# Patient Record
Sex: Male | Born: 1945
Health system: Southern US, Community
[De-identification: ages and names within clinical notes are randomized; demographics above are authoritative.]

## PROBLEM LIST (undated history)

## (undated) DIAGNOSIS — N529 Male erectile dysfunction, unspecified: Secondary | ICD-10-CM

## (undated) DIAGNOSIS — I509 Heart failure, unspecified: Secondary | ICD-10-CM

## (undated) DIAGNOSIS — Z87442 Personal history of urinary calculi: Secondary | ICD-10-CM

## (undated) DIAGNOSIS — I10 Essential (primary) hypertension: Secondary | ICD-10-CM

## (undated) DIAGNOSIS — E785 Hyperlipidemia, unspecified: Secondary | ICD-10-CM

## (undated) DIAGNOSIS — M1990 Unspecified osteoarthritis, unspecified site: Secondary | ICD-10-CM

## (undated) DIAGNOSIS — M25559 Pain in unspecified hip: Secondary | ICD-10-CM

## (undated) DIAGNOSIS — G8929 Other chronic pain: Secondary | ICD-10-CM

## (undated) DIAGNOSIS — G629 Polyneuropathy, unspecified: Secondary | ICD-10-CM

## (undated) DIAGNOSIS — G4730 Sleep apnea, unspecified: Secondary | ICD-10-CM

## (undated) DIAGNOSIS — H209 Unspecified iridocyclitis: Secondary | ICD-10-CM

## (undated) HISTORY — DX: Essential (primary) hypertension: I10

## (undated) HISTORY — DX: Unspecified iridocyclitis: H20.9

## (undated) HISTORY — PX: TONSILLECTOMY AND ADENOIDECTOMY: SUR1326

## (undated) HISTORY — DX: Other chronic pain: G89.29

## (undated) HISTORY — DX: Hyperlipidemia, unspecified: E78.5

## (undated) HISTORY — DX: Pain in unspecified hip: M25.559

## (undated) HISTORY — PX: HERNIA REPAIR: SHX51

## (undated) HISTORY — DX: Polyneuropathy, unspecified: G62.9

## (undated) HISTORY — DX: Male erectile dysfunction, unspecified: N52.9

---
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
## 1998-01-17 ENCOUNTER — Emergency Department (HOSPITAL_COMMUNITY): Admission: EM | Admit: 1998-01-17 | Discharge: 1998-01-17 | Payer: Self-pay

## 1998-07-22 HISTORY — PX: BACK SURGERY: SHX140

## 1998-10-04 ENCOUNTER — Encounter: Admission: RE | Admit: 1998-10-04 | Discharge: 1998-10-19 | Payer: Self-pay

## 1998-11-16 ENCOUNTER — Ambulatory Visit (HOSPITAL_COMMUNITY): Admission: RE | Admit: 1998-11-16 | Discharge: 1998-11-16 | Payer: Self-pay | Admitting: Neurosurgery

## 1999-02-15 ENCOUNTER — Ambulatory Visit (HOSPITAL_COMMUNITY): Admission: RE | Admit: 1999-02-15 | Discharge: 1999-02-16 | Payer: Self-pay | Admitting: Neurosurgery

## 1999-05-20 ENCOUNTER — Emergency Department (HOSPITAL_COMMUNITY): Admission: EM | Admit: 1999-05-20 | Discharge: 1999-05-20 | Payer: Self-pay | Admitting: Emergency Medicine

## 1999-05-20 ENCOUNTER — Encounter: Payer: Self-pay | Admitting: Urology

## 1999-07-18 ENCOUNTER — Ambulatory Visit (HOSPITAL_COMMUNITY): Admission: RE | Admit: 1999-07-18 | Discharge: 1999-07-18 | Payer: Self-pay | Admitting: Neurosurgery

## 1999-07-25 ENCOUNTER — Ambulatory Visit (HOSPITAL_COMMUNITY): Admission: RE | Admit: 1999-07-25 | Discharge: 1999-07-25 | Payer: Self-pay | Admitting: Neurosurgery

## 1999-10-24 ENCOUNTER — Encounter: Payer: Self-pay | Admitting: Emergency Medicine

## 1999-10-25 ENCOUNTER — Encounter: Payer: Self-pay | Admitting: Emergency Medicine

## 1999-10-25 ENCOUNTER — Inpatient Hospital Stay (HOSPITAL_COMMUNITY): Admission: EM | Admit: 1999-10-25 | Discharge: 1999-10-26 | Payer: Self-pay | Admitting: Emergency Medicine

## 1999-10-26 ENCOUNTER — Encounter: Payer: Self-pay | Admitting: Cardiology

## 2000-01-08 ENCOUNTER — Encounter: Payer: Self-pay | Admitting: Orthopedic Surgery

## 2000-01-08 ENCOUNTER — Ambulatory Visit (HOSPITAL_COMMUNITY): Admission: RE | Admit: 2000-01-08 | Discharge: 2000-01-08 | Payer: Self-pay | Admitting: Orthopedic Surgery

## 2000-12-28 ENCOUNTER — Emergency Department (HOSPITAL_COMMUNITY): Admission: EM | Admit: 2000-12-28 | Discharge: 2000-12-29 | Payer: Self-pay | Admitting: Emergency Medicine

## 2000-12-28 ENCOUNTER — Encounter: Payer: Self-pay | Admitting: Emergency Medicine

## 2002-11-01 ENCOUNTER — Ambulatory Visit (HOSPITAL_COMMUNITY): Admission: RE | Admit: 2002-11-01 | Discharge: 2002-11-01 | Payer: Self-pay | Admitting: *Deleted

## 2003-03-31 ENCOUNTER — Ambulatory Visit (HOSPITAL_COMMUNITY): Admission: RE | Admit: 2003-03-31 | Discharge: 2003-03-31 | Payer: Self-pay | Admitting: Cardiology

## 2003-04-20 ENCOUNTER — Ambulatory Visit (HOSPITAL_BASED_OUTPATIENT_CLINIC_OR_DEPARTMENT_OTHER): Admission: RE | Admit: 2003-04-20 | Discharge: 2003-04-20 | Payer: Self-pay | Admitting: *Deleted

## 2003-06-15 ENCOUNTER — Ambulatory Visit (HOSPITAL_COMMUNITY): Admission: RE | Admit: 2003-06-15 | Discharge: 2003-06-15 | Payer: Self-pay | Admitting: Family Medicine

## 2003-06-24 ENCOUNTER — Ambulatory Visit (HOSPITAL_COMMUNITY): Admission: RE | Admit: 2003-06-24 | Discharge: 2003-06-24 | Payer: Self-pay | Admitting: Family Medicine

## 2003-08-31 ENCOUNTER — Emergency Department (HOSPITAL_COMMUNITY): Admission: EM | Admit: 2003-08-31 | Discharge: 2003-08-31 | Payer: Self-pay | Admitting: Emergency Medicine

## 2005-03-03 ENCOUNTER — Emergency Department (HOSPITAL_COMMUNITY): Admission: EM | Admit: 2005-03-03 | Discharge: 2005-03-03 | Payer: Self-pay | Admitting: Emergency Medicine

## 2005-04-04 ENCOUNTER — Ambulatory Visit (HOSPITAL_COMMUNITY): Admission: RE | Admit: 2005-04-04 | Discharge: 2005-04-04 | Payer: Self-pay | Admitting: Family Medicine

## 2005-05-09 ENCOUNTER — Emergency Department (HOSPITAL_COMMUNITY): Admission: EM | Admit: 2005-05-09 | Discharge: 2005-05-09 | Payer: Self-pay | Admitting: Emergency Medicine

## 2005-05-13 ENCOUNTER — Ambulatory Visit (HOSPITAL_COMMUNITY): Admission: RE | Admit: 2005-05-13 | Discharge: 2005-05-13 | Payer: Self-pay | Admitting: *Deleted

## 2005-05-14 ENCOUNTER — Ambulatory Visit (HOSPITAL_COMMUNITY): Admission: RE | Admit: 2005-05-14 | Discharge: 2005-05-14 | Payer: Self-pay | Admitting: *Deleted

## 2005-08-18 ENCOUNTER — Encounter: Payer: Self-pay | Admitting: Emergency Medicine

## 2005-08-19 ENCOUNTER — Ambulatory Visit: Payer: Self-pay | Admitting: Psychiatry

## 2005-08-19 ENCOUNTER — Inpatient Hospital Stay (HOSPITAL_COMMUNITY): Admission: EM | Admit: 2005-08-19 | Discharge: 2005-08-21 | Payer: Self-pay | Admitting: Psychiatry

## 2005-10-02 ENCOUNTER — Ambulatory Visit (HOSPITAL_COMMUNITY): Admission: RE | Admit: 2005-10-02 | Discharge: 2005-10-02 | Payer: Self-pay | Admitting: General Surgery

## 2006-04-30 ENCOUNTER — Inpatient Hospital Stay (HOSPITAL_COMMUNITY): Admission: EM | Admit: 2006-04-30 | Discharge: 2006-05-02 | Payer: Self-pay | Admitting: Emergency Medicine

## 2006-04-30 ENCOUNTER — Encounter: Payer: Self-pay | Admitting: Cardiology

## 2006-12-09 ENCOUNTER — Emergency Department (HOSPITAL_COMMUNITY): Admission: EM | Admit: 2006-12-09 | Discharge: 2006-12-09 | Payer: Self-pay | Admitting: Emergency Medicine

## 2009-03-28 ENCOUNTER — Emergency Department (HOSPITAL_BASED_OUTPATIENT_CLINIC_OR_DEPARTMENT_OTHER): Admission: EM | Admit: 2009-03-28 | Discharge: 2009-03-28 | Payer: Self-pay | Admitting: Emergency Medicine

## 2009-03-30 ENCOUNTER — Ambulatory Visit: Payer: Self-pay | Admitting: Diagnostic Radiology

## 2009-03-30 ENCOUNTER — Emergency Department (HOSPITAL_BASED_OUTPATIENT_CLINIC_OR_DEPARTMENT_OTHER): Admission: EM | Admit: 2009-03-30 | Discharge: 2009-03-30 | Payer: Self-pay | Admitting: Emergency Medicine

## 2009-04-22 ENCOUNTER — Ambulatory Visit: Payer: Self-pay | Admitting: Diagnostic Radiology

## 2009-04-22 ENCOUNTER — Emergency Department (HOSPITAL_BASED_OUTPATIENT_CLINIC_OR_DEPARTMENT_OTHER): Admission: EM | Admit: 2009-04-22 | Discharge: 2009-04-22 | Payer: Self-pay | Admitting: Emergency Medicine

## 2009-07-20 ENCOUNTER — Emergency Department (HOSPITAL_BASED_OUTPATIENT_CLINIC_OR_DEPARTMENT_OTHER): Admission: EM | Admit: 2009-07-20 | Discharge: 2009-07-20 | Payer: Self-pay | Admitting: Emergency Medicine

## 2009-07-21 ENCOUNTER — Inpatient Hospital Stay (HOSPITAL_COMMUNITY): Admission: EM | Admit: 2009-07-21 | Discharge: 2009-07-23 | Payer: Self-pay

## 2009-07-21 ENCOUNTER — Encounter: Payer: Self-pay | Admitting: Emergency Medicine

## 2009-07-21 ENCOUNTER — Ambulatory Visit: Payer: Self-pay | Admitting: Diagnostic Radiology

## 2009-07-22 ENCOUNTER — Encounter (INDEPENDENT_AMBULATORY_CARE_PROVIDER_SITE_OTHER): Payer: Self-pay | Admitting: Surgery

## 2010-10-07 LAB — COMPREHENSIVE METABOLIC PANEL
ALT: 73 U/L — ABNORMAL HIGH (ref 0–53)
AST: 75 U/L — ABNORMAL HIGH (ref 0–37)
Albumin: 2.8 g/dL — ABNORMAL LOW (ref 3.5–5.2)
Alkaline Phosphatase: 58 U/L (ref 39–117)
BUN: 6 mg/dL (ref 6–23)
CO2: 28 mEq/L (ref 19–32)
Calcium: 8.2 mg/dL — ABNORMAL LOW (ref 8.4–10.5)
Chloride: 105 mEq/L (ref 96–112)
Creatinine, Ser: 1.1 mg/dL (ref 0.4–1.5)
GFR calc Af Amer: >60 mL/min (ref >60)
GFR calc non Af Amer: >60 mL/min (ref >60)
Glucose, Bld: 112 mg/dL — ABNORMAL HIGH (ref 70–99)
Potassium: 4 mEq/L (ref 3.5–5.1)
Sodium: 139 mEq/L (ref 135–145)
Total Bilirubin: 1.3 mg/dL — ABNORMAL HIGH (ref 0.3–1.2)
Total Protein: 5.7 g/dL — ABNORMAL LOW (ref 6.0–8.3)

## 2010-10-07 LAB — CBC
HCT: 34.8 % — ABNORMAL LOW (ref 39.0–52.0)
HCT: 36.3 % — ABNORMAL LOW (ref 39.0–52.0)
Hemoglobin: 12.3 g/dL — ABNORMAL LOW (ref 13.0–17.0)
Hemoglobin: 12.7 g/dL — ABNORMAL LOW (ref 13.0–17.0)
MCHC: 35.1 g/dL (ref 30.0–36.0)
MCHC: 35.2 g/dL (ref 30.0–36.0)
MCV: 91.8 fL (ref 78.0–100.0)
MCV: 92.1 fL (ref 78.0–100.0)
Platelets: 166 10*3/uL (ref 150–400)
Platelets: 186 10*3/uL (ref 150–400)
RBC: 3.79 MIL/uL — ABNORMAL LOW (ref 4.22–5.81)
RBC: 3.94 MIL/uL — ABNORMAL LOW (ref 4.22–5.81)
RDW: 14 % (ref 11.5–15.5)
RDW: 14.2 % (ref 11.5–15.5)
WBC: 12.4 10*3/uL — ABNORMAL HIGH (ref 4.0–10.5)
WBC: 13.7 10*3/uL — ABNORMAL HIGH (ref 4.0–10.5)

## 2010-10-07 LAB — BASIC METABOLIC PANEL
BUN: 7 mg/dL (ref 6–23)
CO2: 29 mEq/L (ref 19–32)
Calcium: 8.4 mg/dL (ref 8.4–10.5)
Chloride: 101 mEq/L (ref 96–112)
Creatinine, Ser: 1.26 mg/dL (ref 0.4–1.5)
GFR calc Af Amer: >60 mL/min (ref >60)
GFR calc non Af Amer: 58 mL/min — ABNORMAL LOW (ref >60)
Glucose, Bld: 101 mg/dL — ABNORMAL HIGH (ref 70–99)
Potassium: 3.8 mEq/L (ref 3.5–5.1)
Sodium: 137 mEq/L (ref 135–145)

## 2010-10-22 LAB — COMPREHENSIVE METABOLIC PANEL
ALT: 23 U/L (ref 0–53)
ALT: 25 U/L (ref 0–53)
AST: 24 U/L (ref 0–37)
AST: 26 U/L (ref 0–37)
Albumin: 4.1 g/dL (ref 3.5–5.2)
Alkaline Phosphatase: 91 U/L (ref 39–117)
BUN: 15 mg/dL (ref 6–23)
CO2: 30 mEq/L (ref 19–32)
CO2: 30 mEq/L (ref 19–32)
Calcium: 9.4 mg/dL (ref 8.4–10.5)
Calcium: 9.8 mg/dL (ref 8.4–10.5)
Chloride: 103 mEq/L (ref 96–112)
Creatinine, Ser: 1 mg/dL (ref 0.4–1.5)
GFR calc Af Amer: >60 mL/min (ref >60)
GFR calc Af Amer: >60 mL/min (ref >60)
GFR calc non Af Amer: >60 mL/min (ref >60)
Glucose, Bld: 101 mg/dL — ABNORMAL HIGH (ref 70–99)
Potassium: 4.2 mEq/L (ref 3.5–5.1)
Sodium: 143 mEq/L (ref 135–145)
Sodium: 143 mEq/L (ref 135–145)
Total Bilirubin: 0.5 mg/dL (ref 0.3–1.2)
Total Protein: 7.3 g/dL (ref 6.0–8.3)
Total Protein: 7.3 g/dL (ref 6.0–8.3)

## 2010-10-22 LAB — CBC
HCT: 40 % (ref 39.0–52.0)
Hemoglobin: 14 g/dL (ref 13.0–17.0)
MCHC: 35.1 g/dL (ref 30.0–36.0)
MCHC: 35.1 g/dL (ref 30.0–36.0)
MCV: 91.2 fL (ref 78.0–100.0)
Platelets: 245 10*3/uL (ref 150–400)
RBC: 4.38 MIL/uL (ref 4.22–5.81)
RBC: 4.48 MIL/uL (ref 4.22–5.81)
RDW: 13.1 % (ref 11.5–15.5)
RDW: 13.3 % (ref 11.5–15.5)
WBC: 11.9 10*3/uL — ABNORMAL HIGH (ref 4.0–10.5)

## 2010-10-22 LAB — DIFFERENTIAL
Basophils Absolute: 0.2 10*3/uL — ABNORMAL HIGH (ref 0.0–0.1)
Basophils Relative: 2 % — ABNORMAL HIGH (ref 0–1)
Eosinophils Absolute: 0.1 10*3/uL (ref 0.0–0.7)
Eosinophils Absolute: 0.2 10*3/uL (ref 0.0–0.7)
Eosinophils Relative: 1 % (ref 0–5)
Eosinophils Relative: 2 % (ref 0–5)
Lymphocytes Relative: 18 % (ref 12–46)
Lymphs Abs: 1.3 10*3/uL (ref 0.7–4.0)
Lymphs Abs: 2.2 10*3/uL (ref 0.7–4.0)
Monocytes Absolute: 0.8 10*3/uL (ref 0.1–1.0)
Monocytes Relative: 4 % (ref 3–12)
Monocytes Relative: 6 % (ref 3–12)
Neutro Abs: 8.5 10*3/uL — ABNORMAL HIGH (ref 1.7–7.7)
Neutrophils Relative %: 72 % (ref 43–77)

## 2010-10-22 LAB — LIPASE, BLOOD: Lipase: 51 U/L (ref 23–300)

## 2010-10-22 LAB — POCT CARDIAC MARKERS
CKMB, poc: 4 ng/mL (ref 1.0–8.0)
Myoglobin, poc: 132 ng/mL (ref 12–200)
Troponin i, poc: <0.05 ng/mL (ref 0.00–0.09)

## 2010-10-25 LAB — CBC
HCT: 39.6 % (ref 39.0–52.0)
Hemoglobin: 14.1 g/dL (ref 13.0–17.0)
RDW: 12.8 % (ref 11.5–15.5)

## 2010-10-25 LAB — D-DIMER, QUANTITATIVE: D-Dimer, Quant: <0.22 ug/mL-FEU (ref 0.00–0.48)

## 2010-10-25 LAB — BASIC METABOLIC PANEL
BUN: 20 mg/dL (ref 6–23)
CO2: 27 mEq/L (ref 19–32)
Chloride: 105 mEq/L (ref 96–112)
Creatinine, Ser: 1 mg/dL (ref 0.4–1.5)

## 2010-10-25 LAB — DIFFERENTIAL
Basophils Absolute: 0.3 10*3/uL — ABNORMAL HIGH (ref 0.0–0.1)
Eosinophils Relative: 3 % (ref 0–5)
Lymphocytes Relative: 20 % (ref 12–46)
Lymphs Abs: 2 10*3/uL (ref 0.7–4.0)
Monocytes Absolute: 0.6 10*3/uL (ref 0.1–1.0)
Neutro Abs: 6.9 10*3/uL (ref 1.7–7.7)
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
## 2010-12-07 NOTE — Op Note (Signed)
NAMEGAYLON, BENTZ              ACCOUNT NO.:  [id number]   MEDICAL RECORD NO.:  [id number]          PATIENT TYPE:  INP   LOCATION:  3710                         FACILITY:  [facility]   PHYSICIAN:  John C. Madilyn Fireman, M.D.    DATE OF BIRTH:  1946/04/20   DATE OF PROCEDURE:  05/02/2006  DATE OF DISCHARGE:                                 OPERATIVE REPORT   PROCEDURE:  Esophagogastroduodenoscopy with esophageal dilatation.   ENDOSCOPIST:  Everardo All. Madilyn Fireman, M.D.   INDICATIONS FOR PROCEDURE:  Chest pain, dysphagia and typical reflux  symptoms with cardiac etiology of current chest pain ruled out this  admission.   PROCEDURE:  The patient was placed in the left lateral decubitus position  and placed on the pulse monitor with continuous low-flow oxygen delivered by  nasal cannula.  He was sedated with 50 mcg of IV fentanyl and 6 mg of IV  Versed.  The Olympus video endoscope was advanced under direct vision into  the oropharynx and esophagus.  The esophagus was straight and of normal  caliber.  The  squamocolumnar line at 37 cm.  There was a circumferential  distinct but widely patent ring at the GE junction.  There was no visible  esophagitis or significant length of stricture.  There was a 2.5 cm hiatal  hernia distal to the ring.  The stomach was entered.  A small amount of what  liquid secretions were suctioned from the fundus.  A retroflexed view of the  cardia confirmed a hiatal hernia, but was otherwise unremarkable.  The  fundus, body, antrum and pylorus all appeared normal.  The duodenum was  entered, and both bulb and second portion were well inspected and appeared  be within normal limits.  A Savary guidewire was placed through the  endoscope channel and entered into the second portion of the duodenum, and  the scope withdrawn.  A single 18 mm Savary dilator was passed over the  guidewire with mild resistance, and no blood seen on withdrawal.  The  dilator was removed together with the  wire.   The patient returned to the recovery room in stable condition.  He tolerated  the procedure well.  There were no immediate complications.   IMPRESSION:  1. Lower esophageal ring.  2. Hiatal hernia.   PLAN:  1. Advance diet and observe response to dilatation.  2. Would keep the patient on a proton pump inhibitor for at least two      months and assess the response in terms of chest pain and reflux      symptoms.          ______________________________  Everardo All Madilyn Fireman, M.D.    JCH/MEDQ  D:  05/02/2006  T:  05/04/2006  Job:  [id number]   cc:   Peter M. Sw[place]iland, M.D.
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
## 2010-12-07 NOTE — Op Note (Signed)
NAMEESSEX, Timothy              ACCOUNT NO.:  [id number]   MEDICAL RECORD NO.:  [id number]          PATIENT TYPE:  AMB   LOCATION:  SDS                          FACILITY:  [facility]   PHYSICIAN:  Sharlet Salina T. Hoxworth, M.D.DATE OF BIRTH:  1945-09-24   DATE OF PROCEDURE:  10/02/2005  DATE OF DISCHARGE:  10/02/2005                                 OPERATIVE REPORT   PRE AND POSTOPERATIVE DIAGNOSIS:  Right inguinal hernia.   SURGICAL PROCEDURES:  Repair of right inguinal hernia.   SURGEON:  Lorne Skeens. Hoxworth, M.D.   ANESTHESIA:  Local with IV sedation.   BRIEF HISTORY:  Timothy Sheppard is a 65 year old male who presents with  increasing symptomatic right inguinal hernia confirmed on exam. Options for  repair were discussed and we have to elected proceed with open repair using  mesh under local anesthesia with sedation as an outpatient. The nature of  the procedure, indications, risks of bleeding, infection, recurrence of  chronic pain were discussed and understood. He is now brought to operating  room for this procedure.   DESCRIPTION OF OPERATION:  The patient was brought to the operating room and  placed supine position on operating table and IV sedation was administered.  The right groin was sterilely prepped and draped. He received preoperative  IV antibiotics. Correct patient and procedure were verified. Local  anesthesia was used to block the ilioinguinal nerve and infiltrate the area  of the incision. Oblique incision was made in right groin and dissection  carried down to subcutaneous tissue sharply to the external oblique.  Crossing veins were doubly clamped, divided, ligated with 3-0 Vicryl. The  external oblique was anesthetized and divided along the lines of its fibers  through the external ring. The inguinal ligament, rectus sheath and cord  were anesthetized. The spermatic cord was dissected up off of the floor at  the pubic tubercle. Cremasteric fibers were divided  bilaterally up to the  internal ring completely freeing the cord. There was a good sized direct  hernia present. The attenuated transversalis fascia was dissected away from  the cord structures and completely freed from surrounding tissue. It was  then reduced and imbricated with a running 2-0 Prolene suture to hold it  reduced. Dissection within the cord revealed no evidence of an indirect  hernia. The ilioinguinal nerve was identified and protected. A piece of  Parietex mesh was trimmed to size to fit the floor of the inguinal canal  with tails around the cord at the internal ring. The mesh was sutured  initially to the pubic tubercle and to the inguinal ligament, iliopubic  tract with running 2-0 Prolene working medial to lateral.  Medially, the  mesh was sutured to the edge of the rectus sheath with interrupted 2-0  Prolene. Tails were then tacked together lateral to cord with interrupted 2-  0 Prolene creating a new internal ring snug to a fingertip. This appeared to  provide nice broad coverage to the direct and indirect spaces. Following  this, the cord and ilioinguinal nerves returned to their anatomic position.  The external oblique was closed over  this with running 3-0 Vicryl. Scarpa  fascia was closed  with running 3-0 Vicryl. The skin was closed with running subcuticular 4-0  Monocryl and Steri-Strips.  Sponge, needle and instrument counts were  correct..  Dry sterile dressing was applied. The patient taken recovery in  good condition.      Lorne Skeens. Hoxworth, M.D.  Electronically Signed     BTH/MEDQ  D:  10/02/2005  T:  10/03/2005  Job:  [id number]

## 2010-12-07 NOTE — Procedures (Signed)
EEG NUMBER:  __________   HISTORY:  This is a 65 year old with staring spells, who is having an EEG  done to evaluate for seizures.   EEG PROTOCOL:  This is a routine EEG.   TECHNICAL DESCRIPTION:  Throughout this routine EEG, there is a posterior-  dominant rhythm of 9.5-Hz activity at 20-30 mV.  The background activity is  symmetric and mostly comprised of alpha-range activity at 10-25 mV.  Superimposed throughout the background is faster beta activity noted.  With  photic stimulation, there is a mild symmetric photic driving response seen.  Hyperventilation does not produce any significant abnormalities.  The  patient does not go to sleep during this recording.  Throughout this record,  there is no evidence of electrographic seizures or interictal discharge  activity.  Of note on the EKG, the patient seems to have a baseline heart  rate of around 50 beats per minute.   IMPRESSION:  This routine EEG is within normal limits in the awake state.  There is an incidental finding of EKG with bradycardia of around 50 beats  per minute.           ______________________________  Bevelyn Buckles. Nash Shearer, M.D.     YNW:GNFA  D:  05/13/2005 12:48:42  T:  05/14/2005 04:24:38  Job #:  [id number]

## 2010-12-07 NOTE — Discharge Summary (Signed)
[place]  Patient:    Timothy, Sheppard                     MRN: [id number] Adm. Date:  [id number] Disc. Date: [id number] Attending:  Marily Memos CC:         Peter M. Swaziland, M.D.                           Discharge Summary  ADMITTING DIAGNOSES: 1. Chest pain, rule out angina. 2. Dyspnea. 3. Erectile dysfunction. 4. Degenerative disc disease with chronic low back pain.  HISTORY OF PRESENT ILLNESS:  The patient is a 65 year old white male with gradual onset of increasing shortness of breath since mid morning on the day of admission associated with left upper arm aching and some chest tightness that worsened about 4 p.m.  Finally he told his wife this evening and his wife called 911.  He also felt there was some left jaw pain associated with these other symptoms.  He did receive some relief with sublingual nitroglycerin. There is no prior history of chest pain or shortness of breath.  No cardiac history.  He denied any cough, fever, worsening dyspnea with exertion or diaphoresis.  He did have some nausea earlier in the day.  No vomiting.  Does have chronic bilateral lower extremity pain secondary to multiple lumbar disks.  Left leg pain greater than right recently.  Denies any significant edema, swelling or redness of the lower extremities.  The rest of the history and physical can be obtained from the admit note.  MEDICATIONS PRIOR TO ADMISSION: 1. Elavil 10 mg p.o. q.h.s. 2. Valium 2.5 mg p.r.n. 3. Flexeril 10 mg t.i.d. 4. Hydrocodone p.r.n.  HOSPITAL COURSE:  The patient was admitted and routine laboratory was obtained, including EKG.  He was started on aspirin.  Cardiology was consulted who felt a two day adenosine Cardiolite study was in order.  This was performed on April 4 and 5.  This showed no evidence of ischemia or infarct, borderline global hypokinesia with an ejection fraction of about 43%.  He was felt ready for discharge  on October 26, 1999, with follow-up 2D echocardiogram as an outpatient to follow his LV function.  He was also begun on Pepcid, feeling that his chest discomfort was probably cardiac in etiology.  LABORATORY DATA:  The pH was 7.377, pCO2 47.6, bicarb 28.  White count on October 24, 1999, was 10,600; repeat on October 26, 1999, was 8900.  He had an essentially normal differential.  His hemoglobin was 14.3 and on October 26, 1999 it was 40 (this was felt to be dilutional.  MCV was 85.4.  His initial PT was 14.1 with an INR of 1.2 and he was begun on heparin.  His initial PTT was greater than 200.  Repeat was 85 at 1 p.m. on October 25, 1999, and on October 26, 1999 at 0330 was 58.  Sodium 141, potassium 4, chloride 103, glucose 101, BUN 12 and creatinine 1.0.  Initial CK was 131 with MB of 2.3 and a relative index of 1.7.  Troponin  was less than 0.03 on all three of these measurements (including the following two). His second CK was 112 with MB 1.2 and a relative index of 1.1 and his final was 90 with MB 1.1 and relative index of 1.2.  Lipid panel showed a cholesterol of 129, triglycerides 73, HDL 30, LDL 84  with a total cholesterol/HDL ratio of 4.3.  Urinalysis revealed a specific gravity of greater than 1.040, pH 5, all else was essentially negative.  His urobilinogen was 0.2.  An EKG at 9:40 on October 24, 1999, revealed a sinus tachycardia but no acute abnormalities.  He did have a CT of his chest and bilateral limited lower extremities which revealed no evidence of DVT, no evidence of pulmonary emboli, minimal bilateral patchy atelectasis versus scar formation (essentially normal).  His chest x-ray showed stable minimal chronic bronchitic changes, no acute abnormality.  DISPOSITION:  On his discharge to home he was instructed to follow up with Dr. Andrey Campanile in one week and Dr. Swaziland will arrange for a 2D echo.  He is to continue his medications as listed above plus Pepcid 20 mg twice a day.   His activities are as tolerated.  He is recommended to lose weight and follow a low fat, low salt diet. DD:  11/25/99 TD:  11/26/99 Job: [id number]
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
## 2010-12-07 NOTE — Discharge Summary (Signed)
Timothy Sheppard, PURYEAR              ACCOUNT NO.:  [id number]   MEDICAL RECORD NO.:  [id number]          PATIENT TYPE:  INP   LOCATION:  3710                         FACILITY:  [facility]   PHYSICIAN:  [name], MD  DATE OF BIRTH:  25-May-1946   DATE OF ADMISSION:  04/29/2006  DATE OF DISCHARGE:  05/02/2006                                 DISCHARGE SUMMARY   CHIEF COMPLAINT:  At the time of admission was syncope with collapse and  chest pain.   DISCHARGE DIAGNOSES:  1. Syncope with collapse, not correlated with his underlying cardiac      condition.  The patient was seen and evaluated by cardiology.  He is      noted to have sinus bradycardia but is felt this is not contributing to      his event.  Evidently the patient had a stressful argument just prior      to this and this may have precipitated some of the event, however,      during hospital stay he had a similar event that was associated with      some bradycardia.  The cardiology team still feels that this is not      related as he has generally been asymptomatic with his bradycardia.  He      recently had a catheterization which really showed no significant      stenosis and his cardiac enzymes and echocardiogram during the course      of this hospital stay have been within normal limits.  The patient did,      however, continued to have chest pain and therefore further workup of      noncardiac chest pain was recommended.  2. Noncardiac chest pain.  The patient did relate to [place] additional      symptoms of dysphagia for solids.  He, therefore, underwent a      gastroenterology evaluation by [facility].  On May 02, 2006, he underwent endoscopic evaluation, was noted to have a Schatzki      ring which was dilated without complication.  At this time the patient      relates no further chest discomfort and is requesting discharge to      home.  3. History of seizure disorder.  The patient has been  diagnosed in the      past with potential seizure disorder, however, on admission did not      appear to be on any medications for this condition.  He has been      evaluated and treated for this in the past.  No further evaluation is      deemed necessary during this hospital stay as this does not appear to      be seizure related.  He did, however, undergo a MRI study which showed      no obvious acute disease.  4. The patient does have a history of adjustment disorder requiring      inpatient psychiatric stay.  At this time the patient appears to be at  baseline with his anxiety disorder and adjustment disorder with no      intervention necessary.  Please note, during the hospital stay he did      require occasional Ativan.   DISCHARGE MEDICATIONS:  1. Aspirin 325 mg once daily.  2. Prilosec over-the-counter once daily.   At this time he is on no medications for seizures and can followup in the  outpatient setting with Dr. Tiburcio Pea with regard to this status.   STUDIES:  During the course of this hospital stay, as stated, MRI showed no  acute intracranial abnormality.  No significant narrowing in the proximal  area or branch vessels.  A portable chest x-ray was completed and showed no  acute abnormality.  A 2D echo was performed which showed normal LV function  with mild left atrial enlargement and mild MR. The official report is not in  the computer at this time.   PHYSICAL EXAMINATION:  GENERAL:  On the day of discharge, the patient is  clinically stable.  Heart rate ranges as low as 39, up to 74, with no  symptoms of bradycardia.  He is a well-developed, well-nourished white male  in no acute distress.  HEENT:  Normocephalic atraumatic.  Pupils are equal, round, and reactive to  light.  Extraocular muscles are intact.  Mucous membranes are moist.  NECK:  Supple.  There is no JVD.  No lymph nodes.  No carotid bruit.  CHEST:  Clear to auscultation.  There are no rhonchi,  rales, or wheezes.  CARDIOVASCULAR:  Regular rate and rhythm.  Positive S1 S2.  No S3, S4.  No  murmurs, rubs, or gallops.  ABDOMEN:  Soft, nontender, nondistended with positive bowel sounds.  NEUROLOGIC:  The patient is awake, alert, oriented.  Cranial nerves II-XII  are intact.  Power is 5/5.  DTRs are 2+.  Plantars are downgoing.   PERTINENT LABORATORY VALUES:  TSH is 0.903.  His urine drug screen was  positive for opiates but no illicit drugs.  His cardiac panels have remained  negative.  His PTT was 36.  His PT is 15.7 with an INR of 1.2.  His BNP is  49.  His most recent BUN was 11 with a creatinine of 1.0, and his potassium  was 3.5 .   At this time, the patient is deemed stable for discharge to follow up as an  outpatient with Dr. Swaziland and can follow up with Dr. Madilyn Fireman as necessary.  He can see Dr. Holley Bouche in the next 1 week to coordinate care.      [name], MD  Electronically Signed     MLT/MEDQ  D:  05/02/2006  T:  05/02/2006  Job:  [id number]   cc:   Holley Bouche, M.D.  John C. Madilyn Fireman, M.D.  Peter M. Swaziland, M.D.
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
## 2013-04-20 ENCOUNTER — Emergency Department (HOSPITAL_COMMUNITY)
Admission: EM | Admit: 2013-04-20 | Discharge: 2013-04-20 | Disposition: A | Discharge status: Home / Self Care | Payer: Medicare Other | Source: Home / Self Care

## 2013-04-20 ENCOUNTER — Encounter (HOSPITAL_COMMUNITY): Payer: Self-pay | Admitting: *Deleted

## 2013-04-20 DIAGNOSIS — R39198 Other difficulties with micturition: Secondary | ICD-10-CM

## 2013-04-20 DIAGNOSIS — M25512 Pain in left shoulder: Secondary | ICD-10-CM

## 2013-04-20 DIAGNOSIS — M25519 Pain in unspecified shoulder: Secondary | ICD-10-CM

## 2013-04-20 DIAGNOSIS — R3912 Poor urinary stream: Secondary | ICD-10-CM

## 2013-04-20 DIAGNOSIS — R509 Fever, unspecified: Secondary | ICD-10-CM

## 2013-04-20 DIAGNOSIS — I10 Essential (primary) hypertension: Secondary | ICD-10-CM

## 2013-04-20 HISTORY — DX: Essential (primary) hypertension: I10

## 2013-04-20 HISTORY — DX: Heart failure, unspecified: I50.9

## 2013-04-20 MED ORDER — AMOXICILLIN-POT CLAVULANATE 875-125 MG PO TABS: 1.0000 | ORAL_TABLET | Freq: Two times a day (BID) | ORAL | Status: DC

## 2013-04-20 MED ORDER — CYCLOBENZAPRINE HCL 5 MG PO TABS: 5.0000 mg | ORAL_TABLET | Freq: Three times a day (TID) | ORAL | Status: DC | PRN

## 2013-04-20 MED ORDER — CLONIDINE HCL 0.1 MG PO TABS
ORAL_TABLET | ORAL | Status: AC
  Filled 2013-04-20: qty 1

## 2013-04-20 MED ORDER — TAMSULOSIN HCL 0.4 MG PO CAPS: 0.4000 mg | ORAL_CAPSULE | Freq: Every day | ORAL | Status: DC

## 2013-04-20 MED ORDER — CLONIDINE HCL 0.1 MG PO TABS
0.1000 mg | ORAL_TABLET | Freq: Once | ORAL | Status: AC
  Administered 2013-04-20: 0.1 mg via ORAL

## 2013-04-20 MED ORDER — LISINOPRIL 10 MG PO TABS: 10.0000 mg | ORAL_TABLET | Freq: Every day | ORAL | Status: DC

## 2013-04-20 MED ORDER — KETOROLAC TROMETHAMINE 60 MG/2ML IM SOLN
60.0000 mg | Freq: Once | INTRAMUSCULAR | Status: AC
  Administered 2013-04-20: 60 mg via INTRAMUSCULAR

## 2013-04-20 MED ORDER — MELOXICAM 7.5 MG PO TABS: 7.5000 mg | ORAL_TABLET | Freq: Every day | ORAL | Status: DC

## 2013-04-20 MED ORDER — KETOROLAC TROMETHAMINE 60 MG/2ML IM SOLN
INTRAMUSCULAR | Status: AC
  Filled 2013-04-20: qty 2

## 2013-04-20 NOTE — ED Provider Notes (Signed)
Medical screening examination/treatment/procedure(s) were performed by non-physician practitioner and as supervising physician I was immediately available for consultation/collaboration.  Leslee Home, M.D.  Reuben Likes, MD 04/20/13 [phone]

## 2013-04-20 NOTE — ED Provider Notes (Signed)
CSN: [id number]     Arrival date & time 04/20/13  1902 History   None    Chief Complaint  Patient presents with  . Fever   (Consider location/radiation/quality/duration/timing/severity/associated sxs/prior Treatment) HPI  Fever and general fatigue: Started SUnday. Chills. Tolerating PO. Wife w/ PNA last week. Diarrhea and nausea. Aleve and ASA w/ mild benefit. Comes and goes. Cough but no rhinorrhea.   Aches in shoulder. After playing w/ grandson.  Not taking lisinopril on a regular basis. Denies CP, HA, SOB  Low stream on urination. Denies dysuria. Slowly getting worse.    Past Medical History  Diagnosis Date  . Hypertension   . CHF (congestive heart failure)    Past Surgical History  Procedure Laterality Date  . Orif femur fracture  1967    rod insertion   Family History  Problem Relation Age of Onset  . Heart failure Mother   . Lupus Mother   . Heart failure Father    History  Substance Use Topics  . Smoking status: Current Some Day Smoker    Types: Pipe  . Smokeless tobacco: Not on file  . Alcohol Use: No    Review of Systems  All other systems reviewed and are negative.    Allergies  Review of patient's allergies indicates no known allergies.  Home Medications   Current Outpatient Rx  Name  Route  Sig  Dispense  Refill  . aspirin 81 MG tablet   Oral   Take 81 mg by mouth daily.         . naproxen sodium (ANAPROX) 220 MG tablet   Oral   Take 220 mg by mouth as needed.         Marland Kitchen amoxicillin-clavulanate (AUGMENTIN) 875-125 MG per tablet   Oral   Take 1 tablet by mouth 2 (two) times daily.   20 tablet   0   . cyclobenzaprine (FLEXERIL) 5 MG tablet   Oral   Take 1-2 tablets (5-10 mg total) by mouth 3 (three) times daily as needed for muscle spasms.   30 tablet   0   . lisinopril (PRINIVIL,ZESTRIL) 10 MG tablet   Oral   Take 1 tablet (10 mg total) by mouth daily.   30 tablet   11   . meloxicam (MOBIC) 7.5 MG tablet   Oral   Take  1-2 tablets (7.5-15 mg total) by mouth daily.   30 tablet   0   . tamsulosin (FLOMAX) 0.4 MG CAPS capsule   Oral   Take 1 capsule (0.4 mg total) by mouth daily after breakfast.   30 capsule   1    BP 141/100  Pulse 109  Temp(Src) 100.1 F (37.8 C)  Resp 16  SpO2 98% Physical Exam  Constitutional: He is oriented to person, place, and time. He appears well-developed and well-nourished. No distress.  HENT:  Head: Normocephalic and atraumatic.  Eyes: EOM are normal. Pupils are equal, round, and reactive to light.  Neck: Normal range of motion. Neck supple.  Cardiovascular: Normal rate and intact distal pulses.   Murmur heard. Pulmonary/Chest: Effort normal and breath sounds normal. No respiratory distress.  Abdominal: Soft. Bowel sounds are normal.  Musculoskeletal: Normal range of motion. He exhibits no edema and no tenderness.  FROm of L shoulder. Muscle tightness on exam and generally stiff. No crepitus  Neurological: He is alert and oriented to person, place, and time.  Skin: Skin is warm and dry. He is not diaphoretic.  Psychiatric: He  has a normal mood and affect. His behavior is normal. Judgment and thought content normal.  1+ LE pitting edema   ED Course  Procedures (including critical care time) Labs Review Labs Reviewed - No data to display Imaging Review No results found.  MDM   1. Febrile illness   2. Shoulder pain, left   3. Poor urinary stream   4. HTN (hypertension)    67yo M w/ h/o CHF and uncontrolled HTN w/ likely viral illness but concern for bacterial superinfection (wife w/ recnet PNA) and concern for CHF exacerbation given mitral valve insufficiency and extreme HTN. No current evidence for decompensation but will provide ABX coverage given risk factors. Muscle spasm from recent shoulder injury. Likely BPH given prolonged h/o poor urinary stream.  - Clonidine in clinic for HTN, refilled lisinopril as pt w/o PCP (PCP died) - flomax (risks benefit  explained) - flexeril and meloxicam for shoulder - Augmentin - precautions given and all questions answered  Shelly Flatten, MD Family Medicine PGY-3 04/20/2013, 10:24 PM      Ozella Rocks, MD 04/20/13 2224

## 2013-04-20 NOTE — ED Notes (Signed)
C/o feeling hot and cold since Sunday with aching all over, sorethroat and tongue is sore.  Sore in his shoulder and down his arm x 10 days and his hands got to sleep a lot.  Was laying down on the floor with his grandson and was propped up on his arm.  He thinks he strained it. Sore in his tricep and shoulder with movement.  Hx. CHF.

## 2013-04-25 ENCOUNTER — Encounter (HOSPITAL_COMMUNITY): Payer: Self-pay | Admitting: *Deleted

## 2013-04-25 ENCOUNTER — Emergency Department (HOSPITAL_COMMUNITY): Payer: Medicare Other

## 2013-04-25 ENCOUNTER — Observation Stay (HOSPITAL_COMMUNITY)
Admission: EM | Admit: 2013-04-25 | Discharge: 2013-04-26 | Disposition: A | Discharge status: Home / Self Care | Payer: Medicare Other | Attending: Internal Medicine | Admitting: Internal Medicine

## 2013-04-25 DIAGNOSIS — R0789 Other chest pain: Principal | ICD-10-CM | POA: Insufficient documentation

## 2013-04-25 DIAGNOSIS — I10 Essential (primary) hypertension: Secondary | ICD-10-CM | POA: Insufficient documentation

## 2013-04-25 DIAGNOSIS — R0602 Shortness of breath: Secondary | ICD-10-CM | POA: Insufficient documentation

## 2013-04-25 DIAGNOSIS — K1370 Unspecified lesions of oral mucosa: Secondary | ICD-10-CM | POA: Insufficient documentation

## 2013-04-25 DIAGNOSIS — I080 Rheumatic disorders of both mitral and aortic valves: Secondary | ICD-10-CM | POA: Insufficient documentation

## 2013-04-25 DIAGNOSIS — R079 Chest pain, unspecified: Secondary | ICD-10-CM | POA: Diagnosis present

## 2013-04-25 DIAGNOSIS — Z8249 Family history of ischemic heart disease and other diseases of the circulatory system: Secondary | ICD-10-CM | POA: Insufficient documentation

## 2013-04-25 DIAGNOSIS — K219 Gastro-esophageal reflux disease without esophagitis: Secondary | ICD-10-CM | POA: Insufficient documentation

## 2013-04-25 DIAGNOSIS — Z7982 Long term (current) use of aspirin: Secondary | ICD-10-CM | POA: Insufficient documentation

## 2013-04-25 DIAGNOSIS — M79609 Pain in unspecified limb: Secondary | ICD-10-CM | POA: Insufficient documentation

## 2013-04-25 DIAGNOSIS — J209 Acute bronchitis, unspecified: Secondary | ICD-10-CM | POA: Insufficient documentation

## 2013-04-25 DIAGNOSIS — R209 Unspecified disturbances of skin sensation: Secondary | ICD-10-CM | POA: Insufficient documentation

## 2013-04-25 HISTORY — DX: Chest pain, unspecified: R07.9

## 2013-04-25 LAB — CBC WITH DIFFERENTIAL/PLATELET
HCT: 41.4 % (ref 39.0–52.0)
Hemoglobin: 14.2 g/dL (ref 13.0–17.0)
Lymphs Abs: 1 10*3/uL (ref 0.7–4.0)
MCH: 28.9 pg (ref 26.0–34.0)
MCHC: 34.3 g/dL (ref 30.0–36.0)
Monocytes Absolute: 0.4 10*3/uL (ref 0.1–1.0)
Monocytes Relative: 6 % (ref 3–12)
Neutro Abs: 5.2 10*3/uL (ref 1.7–7.7)
Neutrophils Relative %: 78 % — ABNORMAL HIGH (ref 43–77)
RBC: 4.92 MIL/uL (ref 4.22–5.81)

## 2013-04-25 LAB — BASIC METABOLIC PANEL
BUN: 13 mg/dL (ref 6–23)
Chloride: 101 mEq/L (ref 96–112)
Creatinine, Ser: 0.78 mg/dL (ref 0.50–1.35)
Glucose, Bld: 109 mg/dL — ABNORMAL HIGH (ref 70–99)
Potassium: 3.6 mEq/L (ref 3.5–5.1)

## 2013-04-25 LAB — TROPONIN I: Troponin I: <0.3 ng/mL (ref <0.30)

## 2013-04-25 MED ORDER — ONDANSETRON HCL 4 MG PO TABS: 4.0000 mg | ORAL_TABLET | Freq: Four times a day (QID) | ORAL | Status: DC | PRN

## 2013-04-25 MED ORDER — ONDANSETRON HCL 4 MG/2ML IJ SOLN
4.0000 mg | Freq: Once | INTRAMUSCULAR | Status: AC
  Administered 2013-04-25: 4 mg via INTRAVENOUS
  Filled 2013-04-25: qty 2

## 2013-04-25 MED ORDER — LORAZEPAM 0.5 MG PO TABS
0.5000 mg | ORAL_TABLET | Freq: Once | ORAL | Status: AC
  Administered 2013-04-25: 0.5 mg via ORAL
  Filled 2013-04-25: qty 1

## 2013-04-25 MED ORDER — SODIUM CHLORIDE 0.9 % IJ SOLN: 3.0000 mL | INTRAMUSCULAR | Status: DC | PRN

## 2013-04-25 MED ORDER — ASPIRIN 81 MG PO TABS: 81.0000 mg | ORAL_TABLET | Freq: Every day | ORAL | Status: DC

## 2013-04-25 MED ORDER — ASPIRIN 81 MG PO CHEW
81.0000 mg | CHEWABLE_TABLET | Freq: Every day | ORAL | Status: DC
  Administered 2013-04-26: 81 mg via ORAL
  Filled 2013-04-25: qty 1

## 2013-04-25 MED ORDER — MAGIC MOUTHWASH
10.0000 mL | Freq: Once | ORAL | Status: AC
  Administered 2013-04-25: 10 mL via ORAL
  Filled 2013-04-25: qty 10

## 2013-04-25 MED ORDER — MAGIC MOUTHWASH
5.0000 mL | Freq: Three times a day (TID) | ORAL | Status: DC | PRN
  Administered 2013-04-25 – 2013-04-26 (×2): 5 mL via ORAL
  Filled 2013-04-25 (×4): qty 5

## 2013-04-25 MED ORDER — LISINOPRIL 20 MG PO TABS
20.0000 mg | ORAL_TABLET | Freq: Every day | ORAL | Status: DC
  Administered 2013-04-26: 20 mg via ORAL
  Filled 2013-04-25: qty 1

## 2013-04-25 MED ORDER — ASPIRIN 81 MG PO CHEW
324.0000 mg | CHEWABLE_TABLET | Freq: Once | ORAL | Status: AC
  Administered 2013-04-25: 324 mg via ORAL
  Filled 2013-04-25: qty 4

## 2013-04-25 MED ORDER — MORPHINE SULFATE 2 MG/ML IJ SOLN
1.0000 mg | INTRAMUSCULAR | Status: DC | PRN
  Filled 2013-04-25: qty 1

## 2013-04-25 MED ORDER — SODIUM CHLORIDE 0.9 % IJ SOLN: 3.0000 mL | Freq: Two times a day (BID) | INTRAMUSCULAR | Status: DC

## 2013-04-25 MED ORDER — LISINOPRIL 10 MG PO TABS: 10.0000 mg | ORAL_TABLET | Freq: Every day | ORAL | Status: DC

## 2013-04-25 MED ORDER — TAMSULOSIN HCL 0.4 MG PO CAPS
0.4000 mg | ORAL_CAPSULE | Freq: Every day | ORAL | Status: DC
  Administered 2013-04-26: 0.4 mg via ORAL
  Filled 2013-04-25: qty 1

## 2013-04-25 MED ORDER — ONDANSETRON HCL 4 MG/2ML IJ SOLN
4.0000 mg | Freq: Four times a day (QID) | INTRAMUSCULAR | Status: DC | PRN
  Administered 2013-04-25: 4 mg via INTRAVENOUS
  Filled 2013-04-25: qty 2

## 2013-04-25 MED ORDER — PANTOPRAZOLE SODIUM 40 MG PO TBEC
40.0000 mg | DELAYED_RELEASE_TABLET | Freq: Two times a day (BID) | ORAL | Status: DC
  Administered 2013-04-25 – 2013-04-26 (×3): 40 mg via ORAL
  Filled 2013-04-25 (×5): qty 1

## 2013-04-25 MED ORDER — ENOXAPARIN SODIUM 60 MG/0.6ML ~~LOC~~ SOLN
60.0000 mg | SUBCUTANEOUS | Status: DC
  Administered 2013-04-25: 22:00:00 60 mg via SUBCUTANEOUS
  Filled 2013-04-25 (×2): qty 0.6

## 2013-04-25 MED ORDER — SODIUM CHLORIDE 0.9 % IV SOLN: 250.0000 mL | INTRAVENOUS | Status: DC | PRN

## 2013-04-25 MED ORDER — NITROGLYCERIN 0.4 MG SL SUBL: 0.4000 mg | SUBLINGUAL_TABLET | SUBLINGUAL | Status: DC | PRN

## 2013-04-25 MED ORDER — ACETAMINOPHEN 325 MG PO TABS
650.0000 mg | ORAL_TABLET | Freq: Once | ORAL | Status: AC
  Administered 2013-04-25: 650 mg via ORAL
  Filled 2013-04-25: qty 2

## 2013-04-25 MED ORDER — CYCLOBENZAPRINE HCL 5 MG PO TABS
5.0000 mg | ORAL_TABLET | Freq: Three times a day (TID) | ORAL | Status: DC | PRN
  Administered 2013-04-25: 22:00:00 5 mg via ORAL
  Filled 2013-04-25: qty 2

## 2013-04-25 MED ORDER — AMOXICILLIN-POT CLAVULANATE 875-125 MG PO TABS
1.0000 | ORAL_TABLET | Freq: Two times a day (BID) | ORAL | Status: DC
  Administered 2013-04-25 – 2013-04-26 (×2): 1 via ORAL
  Filled 2013-04-25 (×3): qty 1

## 2013-04-25 NOTE — H&P (Addendum)
[facility] History and Physical  Timothy Sheppard ZOX:[id number] DOB: 09/23/1945 DOA: 04/25/2013  Referring physician: Dr Micheline Maze PCP: No PCP Per Patient  Specialists: none  Chief Complaint: chest pain  HPI: Timothy Sheppard is a 67 y.o. male with PMH significant for HTN, Athritis, ?CHF, and recently seen at an urgent care and started on abx for ?bronchitis who presents with the abovecomplaints. He states for the past week or so he has been having chest pain described as soreness, sharp sometimes, 7/10 in intensity at its worst and associated with SOB. He admits to feeling clammy with the pain, and that for the past 1&1/2 weeks hea has had pain down his LUE with parasthesias in his fingers. When whe was seen at the urgent care as above on 9/30 he was placed on naproxen for this arm pain which he has continued to have. He was seen in the ED and EKG showed NSR with no acute ischemic changes, troponin neg, and CXR neg. He reports that his father had MIs-1st one in his 78s, and his mother had MI age 27.He is admitted for further eval and management. Pt's BP was initially markedly elevated>>188/103 in the ED, he states he has been compliant with his meds since 9/30- wen they were refilled at urgent care(prior to to that he ahd been out of meds for 'a long while'.  Review of Systems: The patient denies, weight loss,, vision loss, decreased hearing, hoarseness, syncope, balance deficits, hemoptysis, abdominal pain, melena, hematochezia, severe indigestion/heartburn, hematuria, incontinence, genital sores, muscle weakness, suspicious skin lesions, transient blindness, difficulty walking, depression, unusual weight change, abnormal bleeding  Past Medical History  Diagnosis Date  . Hypertension   . CHF (congestive heart failure)    Past Surgical History  Procedure Laterality Date  . Orif femur fracture  1967    rod insertion  . Back surgery     Social History:  reports that he has been smoking  Pipe.  He does not have any smokeless tobacco history on file. He reports that he does not drink alcohol or use illicit drugs. where does patient live--home Can patient participate in ADLs-yes  No Known Allergies  Family History  Problem Relation Age of Onset  . Heart failure Mother   . Lupus Mother   . Heart failure Father    Prior to Admission medications   Medication Sig Start Date End Date Taking? Authorizing Provider  amoxicillin-clavulanate (AUGMENTIN) 875-125 MG per tablet Take 1 tablet by mouth 2 (two) times daily. 04/20/13  Yes Ozella Rocks, MD  aspirin 81 MG tablet Take 81 mg by mouth daily.   Yes Historical Provider, MD  cyclobenzaprine (FLEXERIL) 5 MG tablet Take 1-2 tablets (5-10 mg total) by mouth 3 (three) times daily as needed for muscle spasms. 04/20/13  Yes Ozella Rocks, MD  lisinopril (PRINIVIL,ZESTRIL) 10 MG tablet Take 1 tablet (10 mg total) by mouth daily. 04/20/13  Yes Ozella Rocks, MD  meloxicam (MOBIC) 7.5 MG tablet Take 1-2 tablets (7.5-15 mg total) by mouth daily. 04/20/13  Yes Ozella Rocks, MD  naproxen sodium (ANAPROX) 220 MG tablet Take 220 mg by mouth as needed.   Yes Historical Provider, MD  tamsulosin (FLOMAX) 0.4 MG CAPS capsule Take 1 capsule (0.4 mg total) by mouth daily after breakfast. 04/20/13  Yes Ozella Rocks, MD   Physical Exam: Filed Vitals:   04/25/13 1600  BP: 152/85  Pulse: 90  Temp:   Resp: 14    Constitutional: Vital signs  reviewed.  Patient is a well-developed and well-nourished  in no acute distress and cooperative with exam. Alert and oriented x3.  Head: Normocephalic and atraumatic Mouth: no erythema or exudates, MMM Eyes: PERRL, EOMI, conjunctivae normal, No scleral icterus.  Neck: Supple, Trachea midline normal ROM, No JVD, mass, thyromegaly, or carotid bruit present.  Cardiovascular: RRR, S1 normal, S2 normal, no MRG, pulses symmetric and intact bilaterally Pulmonary/Chest: normal respiratory effort, CTAB, no  wheezes, rales, or rhonchi Abdominal: Soft. + epigastric tenderness, non-distended, bowel sounds are normal, no masses, organomegaly, or guarding present.  GU: no CVA tenderness Extremities: No cyanosis and no edema Neurological: A&O x3, Strength is normal and symmetric bilaterally, cranial nerve II-XII are grossly intact, no focal motor deficit, sensory intact to light touch bilaterally.  Skin: Warm, dry and intact. No rash, cyanosis, or clubbing.  Psychiatric: Normal mood and affect. speech and behavior is normal. Judgment and thought content normal. Cognition and memory are normal.     Labs on Admission:  Basic Metabolic Panel:  Recent Labs Lab 04/25/13 1215  NA 136  K 3.6  CL 101  CO2 23  GLUCOSE 109*  BUN 13  CREATININE 0.78  CALCIUM 9.3   Liver Function Tests: No results found for this basename: AST, ALT, ALKPHOS, BILITOT, PROT, ALBUMIN,  in the last 168 hours No results found for this basename: LIPASE, AMYLASE,  in the last 168 hours No results found for this basename: AMMONIA,  in the last 168 hours CBC:  Recent Labs Lab 04/25/13 1215  WBC 6.7  NEUTROABS 5.2  HGB 14.2  HCT 41.4  MCV 84.1  PLT 217   Cardiac Enzymes: No results found for this basename: CKTOTAL, CKMB, CKMBINDEX, TROPONINI,  in the last 168 hours  BNP (last 3 results) No results found for this basename: PROBNP,  in the last 8760 hours CBG: No results found for this basename: GLUCAP,  in the last 168 hours  Radiological Exams on Admission: Dg Chest 2 View  04/25/2013   CLINICAL DATA:  Chest pain and shortness of breath.  EXAM: CHEST - 2 VIEW  COMPARISON:  12/09/2006  FINDINGS: The heart size and mediastinal contours are within normal limits. There is stable eventration of the right hemidiaphragm. There is no evidence of pulmonary edema, consolidation, pneumothorax, nodule or pleural fluid. The bony thorax shows stable degenerative changes of the thoracic spine.  IMPRESSION: No active disease.    Electronically Signed   By: Irish Lack M.D.   On: 04/25/2013 12:40    EKG: Independently reviewed.   Assessment/Plan Active Problems:   Chest pain with L. Arm pain -As discussed above in pt with HTN, family h/o + for father with MI in 31s -place on ASA, PRN nitrates -cycle CE, FLP, Obtain echo follow and consult cards in am pending -#2 possibly contributing factor, see treatment as below   Malignant HTN (hypertension) -continue Lisinopril, increase dose to 20mg , monitor and further treat accordingly Recent Bronchitis -continue augmentin to complete 10days as previously planned ? H/O CHF -check BNP, obtain echo and follow. He has no evidence of vol overload on exam.     Code Status: full Family Communication:wife at bedside Disposition Plan: admit for obsv  Time spent:>30  Kela Millin [facility] Pager [phone]  If 7PM-7AM, please contact night-coverage [email or web address] Password [email or web address] 04/25/2013, 4:26 PM

## 2013-04-25 NOTE — ED Provider Notes (Signed)
Medical screening examination/treatment/procedure(s) were conducted as a shared visit with non-physician practitioner(s) and myself.  I personally evaluated the patient during the encounter Pt presents w/ several days of intermittent CP, L arm/shoulder soreness.  On PE, VSS, pt in NAD. EKG unremarkable. Trop negative. Pt also has URI-like symptoms, but given risk factors of age, sex, HTN, FH, pt felt to need ACS r/o.   Shanna Cisco, MD 04/25/13 [phone]

## 2013-04-25 NOTE — ED Notes (Signed)
Pt states he was diagnosed with a viral infection on Thursday, has been having centralized chest pain since - states pain is sometimes sharp, radiates to left shoulder. Pt reports "blisters" on his tongue which appeared after he was treated at urgent care Thursday.

## 2013-04-25 NOTE — ED Notes (Signed)
MD at bedside. 

## 2013-04-25 NOTE — ED Notes (Signed)
hospitalist at bedside

## 2013-04-25 NOTE — ED Provider Notes (Signed)
CSN: [id number]     Arrival date & time 04/25/13  1134 History   First MD Initiated Contact with Patient 04/25/13 1144     Chief Complaint  Patient presents with  . Chest Pain  . Shortness of Breath   (Consider location/radiation/quality/duration/timing/severity/associated sxs/prior Treatment) HPI Comments: Patient presents today with a chief complaint of shortness of breath and chest pain.  He reports that the chest pain has been intermittent over the past 3 days.  Pain located left anterior chest.  He reports that the pain typically only lasts a few minutes and then resolves.  He has not noticed any association with exertion or with eating.  He reports that he has had associated intermittent left arm pain and numbness.  He also reports intermittent shortness of breath.  He denies any chest pain or SOB at this time.  He does also report associated cough and a subjective fever.  He does have nausea, but no vomiting.  He denies any prior history of CAD.  He does have a history of HTN.  Denies history of DM or Hyperlipidemia.  He does smoke a pipe occasionally.  He reports that his father has had several MI's the first MI at age 42's.  He also reports that his mother had a MI at the age of 6.  He reports that he did have a Cardiac Stress test done 2-3 years ago in [facility], which he reports was normal.  Patient is a 67 y.o. male presenting with chest pain and shortness of breath. The history is provided by the patient.  Chest Pain Associated symptoms: cough and shortness of breath   Shortness of Breath Associated symptoms: chest pain and cough     Past Medical History  Diagnosis Date  . Hypertension   . CHF (congestive heart failure)    Past Surgical History  Procedure Laterality Date  . Orif femur fracture  1967    rod insertion  . Back surgery     Family History  Problem Relation Age of Onset  . Heart failure Mother   . Lupus Mother   . Heart failure Father    History   Substance Use Topics  . Smoking status: Current Some Day Smoker    Types: Pipe  . Smokeless tobacco: Not on file  . Alcohol Use: No    Review of Systems  Respiratory: Positive for cough and shortness of breath.   Cardiovascular: Positive for chest pain.  All other systems reviewed and are negative.    Allergies  Review of patient's allergies indicates no known allergies.  Home Medications   Current Outpatient Rx  Name  Route  Sig  Dispense  Refill  . amoxicillin-clavulanate (AUGMENTIN) 875-125 MG per tablet   Oral   Take 1 tablet by mouth 2 (two) times daily.   20 tablet   0   . aspirin 81 MG tablet   Oral   Take 81 mg by mouth daily.         . cyclobenzaprine (FLEXERIL) 5 MG tablet   Oral   Take 1-2 tablets (5-10 mg total) by mouth 3 (three) times daily as needed for muscle spasms.   30 tablet   0   . lisinopril (PRINIVIL,ZESTRIL) 10 MG tablet   Oral   Take 1 tablet (10 mg total) by mouth daily.   30 tablet   11   . meloxicam (MOBIC) 7.5 MG tablet   Oral   Take 1-2 tablets (7.5-15 mg total) by  mouth daily.   30 tablet   0   . naproxen sodium (ANAPROX) 220 MG tablet   Oral   Take 220 mg by mouth as needed.         . tamsulosin (FLOMAX) 0.4 MG CAPS capsule   Oral   Take 1 capsule (0.4 mg total) by mouth daily after breakfast.   30 capsule   1    BP 137/90  Pulse 80  Temp(Src) 98.8 F (37.1 C) (Oral)  Resp 11  SpO2 99% Physical Exam  Nursing note and vitals reviewed. Constitutional: He appears well-developed and well-nourished.  HENT:  Head: Normocephalic and atraumatic.  Mouth/Throat: Oropharynx is clear and moist.  Neck: Normal range of motion. Neck supple.  Cardiovascular: Normal rate, regular rhythm, normal heart sounds and intact distal pulses.   Pulmonary/Chest: Effort normal and breath sounds normal. No respiratory distress. He has no wheezes. He has no rales. He exhibits no tenderness.  Abdominal: Soft. Bowel sounds are normal. He  exhibits no distension and no mass. There is no tenderness. There is no rebound and no guarding.  Musculoskeletal: Normal range of motion.  No lower extremity edema visualized  Neurological: He is alert. No sensory deficit.  Skin: Skin is warm and dry.  Psychiatric: He has a normal mood and affect.    ED Course  Procedures (including critical care time) Labs Review Labs Reviewed  CBC WITH DIFFERENTIAL - Abnormal; Notable for the following:    Neutrophils Relative % 78 (*)    All other components within normal limits  BASIC METABOLIC PANEL - Abnormal; Notable for the following:    Glucose, Bld 109 (*)    All other components within normal limits  POCT I-STAT TROPONIN I   Imaging Review Dg Chest 2 View  04/25/2013   CLINICAL DATA:  Chest pain and shortness of breath.  EXAM: CHEST - 2 VIEW  COMPARISON:  12/09/2006  FINDINGS: The heart size and mediastinal contours are within normal limits. There is stable eventration of the right hemidiaphragm. There is no evidence of pulmonary edema, consolidation, pneumothorax, nodule or pleural fluid. The bony thorax shows stable degenerative changes of the thoracic spine.  IMPRESSION: No active disease.   Electronically Signed   By: Irish Lack M.D.   On: 04/25/2013 12:40    Date: 04/25/2013  Rate: 83  Rhythm: normal sinus rhythm  QRS Axis: normal  Intervals: normal  ST/T Wave abnormalities: nonspecific T wave changes  Conduction Disutrbances:none  Narrative Interpretation:   Old EKG Reviewed: unchanged   MDM  No diagnosis found. Patient presenting with intermittent left anterior chest pain and SOB over the past 3 days.  Pain typically lasts a few minutes and then resolves without intervention.  Pain associated with some left arm pain and numbness of the left arm.  Patient has been chest pain free during her course in the ED.   Patient does have a significant family history of MI and history of HTN.  Labs unremarkable.  Initial troponin  negative.  CXR negative.  Due to risk factors feel that patient would benefit from admission for further monitoring.  Patient discussed with [facility] who has agreed to admit the patient.      Pascal Lux Tsaile, PA-C 04/25/13 1515

## 2013-04-26 ENCOUNTER — Encounter (HOSPITAL_COMMUNITY): Payer: Self-pay | Admitting: Physician Assistant

## 2013-04-26 DIAGNOSIS — I059 Rheumatic mitral valve disease, unspecified: Secondary | ICD-10-CM

## 2013-04-26 LAB — PRO B NATRIURETIC PEPTIDE: Pro B Natriuretic peptide (BNP): 253.6 pg/mL — ABNORMAL HIGH (ref 0–125)

## 2013-04-26 LAB — LIPID PANEL
LDL Cholesterol: 70 mg/dL (ref 0–99)
Triglycerides: 73 mg/dL (ref <150)
VLDL: 15 mg/dL (ref 0–40)

## 2013-04-26 LAB — TROPONIN I: Troponin I: <0.3 ng/mL (ref <0.30)

## 2013-04-26 MED ORDER — METOPROLOL TARTRATE 12.5 MG HALF TABLET
12.5000 mg | ORAL_TABLET | Freq: Two times a day (BID) | ORAL | Status: DC
  Administered 2013-04-26: 12:00:00 12.5 mg via ORAL
  Filled 2013-04-26 (×2): qty 1

## 2013-04-26 MED ORDER — PANTOPRAZOLE SODIUM 40 MG PO TBEC: 40.0000 mg | DELAYED_RELEASE_TABLET | Freq: Every day | ORAL | Status: DC

## 2013-04-26 MED ORDER — GI COCKTAIL ~~LOC~~
30.0000 mL | Freq: Three times a day (TID) | ORAL | Status: DC | PRN
  Filled 2013-04-26: qty 30

## 2013-04-26 MED ORDER — FLUCONAZOLE 200 MG PO TABS
200.0000 mg | ORAL_TABLET | Freq: Once | ORAL | Status: AC
  Administered 2013-04-26: 200 mg via ORAL
  Filled 2013-04-26: qty 1

## 2013-04-26 MED ORDER — FLUCONAZOLE 200 MG PO TABS: 200.0000 mg | ORAL_TABLET | Freq: Every day | ORAL | Status: DC

## 2013-04-26 MED ORDER — MAGIC MOUTHWASH
15.0000 mL | Freq: Four times a day (QID) | ORAL | Status: DC | PRN
  Administered 2013-04-26: 15 mL via ORAL
  Filled 2013-04-26 (×2): qty 15

## 2013-04-26 MED ORDER — MAGIC MOUTHWASH
15.0000 mL | Freq: Three times a day (TID) | ORAL | Status: DC
  Filled 2013-04-26 (×2): qty 15

## 2013-04-26 MED ORDER — GI COCKTAIL ~~LOC~~: 30.0000 mL | Freq: Three times a day (TID) | ORAL | Status: DC | PRN

## 2013-04-26 MED ORDER — METOPROLOL TARTRATE 12.5 MG HALF TABLET: 12.5000 mg | ORAL_TABLET | Freq: Two times a day (BID) | ORAL | Status: DC

## 2013-04-26 NOTE — Progress Notes (Signed)
[facility] PROGRESS NOTE  Timothy Sheppard ZOX:[id number] DOB: Sep 29, 1945 DOA: 04/25/2013 PCP: No PCP Per Patient Brief HPI: Timothy Sheppard is a 67 y.o. male with PMH significant for HTN, Athritis, ?CHF, and recently seen at an urgent care and started on abx for ?bronchitis who presents with the abovecomplaints. He states for the past week or so he has been having chest pain described as soreness, sharp sometimes, 7/10 in intensity at its worst and associated with SOB.  Reports pain not related to activity , or meals, has been intermittent. He reports that his father had MIs-1st one in his 72s, and his mother had MI age 33.He is admitted for further eval and management.  Pt's BP was initially markedly elevated>>188/103 in the ED, .    Assessment/Plan: 1. Hypertensive urgency: improved after resuming home medications.  2. Chest pain: probably atypical. His cardiac enzymes are negative. EKG does not show any ST t wave changes. Ordered an echocardiogram. He reports he had a stress test done at [place] and we do not have the report. He continues to have intermittent chest pain with left arm parasthetias. Will request cardiology consult to see if he needs further evaluation.  - the substernal chest pain could also be from heart burn, as he reports he has a long history of it and stopped using protonix as he could not afford it. And he was found to have oral ulcers after starting the antibiotics. He could have esophagitis which could be contributing the substernal chest pain. We have started him on magic mouth wash, will give him GI cocktail and started BID protonix. Continue with aspirin. Started him on b blockers.  3. Bronchitis: probably viral, will stop the augmentin.  4. Questionable h/o CHF: echo ordered and pending.  5. DVT prophylaxis.   Code Status: full code Family Communication: none atbedside Disposition Plan: pending. Possibly 1  day.   Consultants:  cardiology  Procedures:  Echocardiogram.   Antibiotics:  augmentin  HPI/Subjective: Oral ulcer pain.   Objective: Filed Vitals:   04/26/13 1008  BP: 168/96  Pulse: 81  Temp: 98.4 F (36.9 C)  Resp: 18    Intake/Output Summary (Last 24 hours) at 04/26/13 1216 Last data filed at 04/26/13 0757  Gross per 24 hour  Intake    120 ml  Output      0 ml  Net    120 ml   Filed Weights   04/25/13 1648  Weight: 122.607 kg (270 lb 4.8 oz)    Exam:   General:  Alert afebrile comfortable  Cardiovascular: s1s2  Respiratory: ctab  Abdomen: soft NTND BS+   Data Reviewed: Basic Metabolic Panel:  Recent Labs Lab 04/25/13 1215  NA 136  K 3.6  CL 101  CO2 23  GLUCOSE 109*  BUN 13  CREATININE 0.78  CALCIUM 9.3   Liver Function Tests: No results found for this basename: AST, ALT, ALKPHOS, BILITOT, PROT, ALBUMIN,  in the last 168 hours No results found for this basename: LIPASE, AMYLASE,  in the last 168 hours No results found for this basename: AMMONIA,  in the last 168 hours CBC:  Recent Labs Lab 04/25/13 1215  WBC 6.7  NEUTROABS 5.2  HGB 14.2  HCT 41.4  MCV 84.1  PLT 217   Cardiac Enzymes:  Recent Labs Lab 04/25/13 1810 04/25/13 2340 04/26/13 0450  TROPONINI <0.30 <0.30 <0.30   BNP (last 3 results)  Recent Labs  04/26/13 0450  PROBNP 253.6*   CBG: No  results found for this basename: GLUCAP,  in the last 168 hours  No results found for this or any previous visit (from the past 240 hour(s)).   Studies: Dg Chest 2 View  04/25/2013   CLINICAL DATA:  Chest pain and shortness of breath.  EXAM: CHEST - 2 VIEW  COMPARISON:  12/09/2006  FINDINGS: The heart size and mediastinal contours are within normal limits. There is stable eventration of the right hemidiaphragm. There is no evidence of pulmonary edema, consolidation, pneumothorax, nodule or pleural fluid. The bony thorax shows stable degenerative changes of the  thoracic spine.  IMPRESSION: No active disease.   Electronically Signed   By: Irish Lack M.D.   On: 04/25/2013 12:40    Scheduled Meds: . aspirin  81 mg Oral Daily  . enoxaparin (LOVENOX) injection  60 mg Subcutaneous Q24H  . fluconazole  200 mg Oral Once  . lisinopril  20 mg Oral Daily  . metoprolol tartrate  12.5 mg Oral BID  . pantoprazole  40 mg Oral BID AC  . sodium chloride  3 mL Intravenous Q12H  . sodium chloride  3 mL Intravenous Q12H  . tamsulosin  0.4 mg Oral Daily   Continuous Infusions:   Active Problems:   Chest pain   HTN (hypertension)    Time spent: 30    [facility]  [facility] Pager [phone]. If 7PM-7AM, please contact night-coverage at [email or web address], password [facility] 04/26/2013, 12:16 PM  LOS: 1 day

## 2013-04-26 NOTE — Progress Notes (Signed)
Echocardiogram 2D Echocardiogram has been performed.  Gladys Deckard 04/26/2013, 11:04 AM

## 2013-04-26 NOTE — Discharge Summary (Signed)
Physician Discharge Summary  Timothy Sheppard ZOX:[id number] DOB: 11-15-1945 DOA: 04/25/2013  PCP: No PCP Per Patient  Admit date: 04/25/2013 Discharge date: 04/26/2013  Time spent: 30  minutes  Recommendations for Outpatient Follow-up:  1. Follow up with cardiology as PCP as recommended.   Discharge Diagnoses:  Active Problems:   Atypical Chest pain   HTN (hypertension) GERD ORAL ULCERS VIRAL BRONCHITIS   Discharge Condition: improved.   Diet recommendation: low sodium diet  Filed Weights   04/25/13 1648  Weight: 122.607 kg (270 lb 4.8 oz)    History of present illness:  Timothy Sheppard is a 67 y.o. male with PMH significant for HTN, Athritis, ?CHF, and recently seen at an urgent care and started on abx for ?bronchitis who presents with the abovecomplaints. He states for the past week or so he has been having chest pain described as soreness, sharp sometimes, 7/10 in intensity at its worst and associated with SOB. He admits to feeling clammy with the pain, and that for the past 1&1/2 weeks hea has had pain down his LUE with parasthesias in his fingers. When whe was seen at the urgent care as above on 9/30 he was placed on naproxen for this arm pain which he has continued to have. He was seen in the ED and EKG showed NSR with no acute ischemic changes, troponin neg, and CXR neg. He reports that his father had MIs-1st one in his 85s, and his mother had MI age 13.He is admitted for further eval and management.     Hospital Course:   1. Hypertensive urgency: improved after resuming home medications.  Chest pain: probably atypical. His cardiac enzymes are negative. EKG does not show any ST t wave changes. Ordered an echocardiogram which showedWall thickness was increased in a pattern of moderate LVH. Systolic function was normal. The estimated ejection fraction wasin the range of 50% to 55%. Wall motion was normal; there were no regional wall motion abnormalities.  Marland Kitchen He reports he had a  stress test done at [place] and we do not have the report. He reports chest pain has resolved.  - the substernal chest pain could also be from heart burn, as he reports he has a long history of it and stopped using protonix as he could not afford it. And he was found to have oral ulcers after starting the antibiotics. He could have esophagitis which could be contributing the substernal chest pain. We have started him on magic mouth wash, will give him GI cocktail and started BID protonix. Continue with aspirin. Started him on b blockers.  3. Bronchitis: probably viral, will stop the augmentin.  4. Questionable h/o CHF: echo ordered and showed mild mR. Moderate LVH and no regional wall abnormalities. Called [place] cardiology and reequested appt to see a cardiologist in one week.   Procedures:  Echocardiogram.  Consultations:  Cardiology over the phone  Discharge Exam: Filed Vitals:   04/26/13 1342  BP: 152/87  Pulse: 71  Temp: 98.7 F (37.1 C)  Resp: 16   General: Alert afebrile comfortable  Cardiovascular: s1s2  Respiratory: ctab  Abdomen: soft NTND BS+   Discharge Instructions  Discharge Orders   Future Appointments Provider Department Dept Phone   05/03/2013 11:15 AM Wendall Stade, MD [facility] [phone]   Future Orders Complete By Expires   Diet - low sodium heart healthy  As directed    Discharge instructions  As directed    Comments:  Follow up with cardiologist in one week.       Medication List    STOP taking these medications       amoxicillin-clavulanate 875-125 MG per tablet  Commonly known as:  AUGMENTIN     meloxicam 7.5 MG tablet  Commonly known as:  MOBIC     naproxen sodium 220 MG tablet  Commonly known as:  ANAPROX      TAKE these medications       aspirin 81 MG tablet  Take 81 mg by mouth daily.     cyclobenzaprine 5 MG tablet  Commonly known as:  FLEXERIL  Take 1-2 tablets (5-10 mg total) by mouth 3  (three) times daily as needed for muscle spasms.     gi cocktail Susp suspension  Take 30 mLs by mouth 3 (three) times daily as needed for indigestion. Shake well.     lisinopril 10 MG tablet  Commonly known as:  PRINIVIL,ZESTRIL  Take 1 tablet (10 mg total) by mouth daily.     metoprolol tartrate 12.5 mg Tabs tablet  Commonly known as:  LOPRESSOR  Take 0.5 tablets (12.5 mg total) by mouth 2 (two) times daily.     pantoprazole 40 MG tablet  Commonly known as:  PROTONIX  Take 1 tablet (40 mg total) by mouth daily.     tamsulosin 0.4 MG Caps capsule  Commonly known as:  FLOMAX  Take 1 capsule (0.4 mg total) by mouth daily after breakfast.       No Known Allergies     Follow-up Information   Follow up with Charlton Haws, MD On 05/03/2013. (Please arrive at 11:00 am for an11:15 am appointment)    Specialty:  Cardiology   Contact information:   [address] [place] [address] [phone]       Follow up with No PCP Per Patient. Schedule an appointment as soon as possible for a visit in 1 week.   Specialty:  General Practice   Contact information:   [address] [place] [address] [phone]        The results of significant diagnostics from this hospitalization (including imaging, microbiology, ancillary and laboratory) are listed below for reference.    Significant Diagnostic Studies: Dg Chest 2 View  04/25/2013   CLINICAL DATA:  Chest pain and shortness of breath.  EXAM: CHEST - 2 VIEW  COMPARISON:  12/09/2006  FINDINGS: The heart size and mediastinal contours are within normal limits. There is stable eventration of the right hemidiaphragm. There is no evidence of pulmonary edema, consolidation, pneumothorax, nodule or pleural fluid. The bony thorax shows stable degenerative changes of the thoracic spine.  IMPRESSION: No active disease.   Electronically Signed   By: Irish Lack M.D.   On: 04/25/2013 12:40    Microbiology: No results  found for this or any previous visit (from the past 240 hour(s)).   Labs: Basic Metabolic Panel:  Recent Labs Lab 04/25/13 1215  NA 136  K 3.6  CL 101  CO2 23  GLUCOSE 109*  BUN 13  CREATININE 0.78  CALCIUM 9.3   Liver Function Tests: No results found for this basename: AST, ALT, ALKPHOS, BILITOT, PROT, ALBUMIN,  in the last 168 hours No results found for this basename: LIPASE, AMYLASE,  in the last 168 hours No results found for this basename: AMMONIA,  in the last 168 hours CBC:  Recent Labs Lab 04/25/13 1215  WBC 6.7  NEUTROABS 5.2  HGB  14.2  HCT 41.4  MCV 84.1  PLT 217   Cardiac Enzymes:  Recent Labs Lab 04/25/13 1810 04/25/13 2340 04/26/13 0450  TROPONINI <0.30 <0.30 <0.30   BNP: BNP (last 3 results)  Recent Labs  04/26/13 0450  PROBNP 253.6*   CBG: No results found for this basename: GLUCAP,  in the last 168 hours     Signed:  Carrieanne Kleen  [facility] 04/26/2013, 8:39 PM

## 2013-05-01 ENCOUNTER — Encounter: Payer: Self-pay | Admitting: Cardiovascular Disease

## 2013-05-03 ENCOUNTER — Ambulatory Visit (INDEPENDENT_AMBULATORY_CARE_PROVIDER_SITE_OTHER): Payer: Medicare Other | Admitting: Cardiovascular Disease

## 2013-05-03 ENCOUNTER — Other Ambulatory Visit: Payer: Self-pay | Admitting: *Deleted

## 2013-05-03 VITALS — BP 140/90 | HR 76 | Ht 74.0 in | Wt 273.0 lb

## 2013-05-03 DIAGNOSIS — R002 Palpitations: Secondary | ICD-10-CM

## 2013-05-03 DIAGNOSIS — I10 Essential (primary) hypertension: Secondary | ICD-10-CM

## 2013-05-03 HISTORY — DX: Palpitations: R00.2

## 2013-05-03 MED ORDER — METOPROLOL SUCCINATE ER 50 MG PO TB24: 50.0000 mg | ORAL_TABLET | Freq: Every day | ORAL | Status: DC

## 2013-05-03 NOTE — Progress Notes (Signed)
Patient ID: Timothy Sheppard, male   DOB: [birth year], 67 y.o.   MRN: [id number] 67 yo seen in [facility] recently for chest pain Referred for f/u.  Initially Rx for bronchitis with antibiotics  He states for the past week or so he has been having chest pain described as soreness, sharp sometimes, 7/10 in intensity at its worst and associated with SOB. He admits to feeling clammy with the pain, and that for the past 1&1/2 weeks hea has had pain down his LUE with parasthesias in his fingers. When whe was seen at the urgent care as above on 9/30 he was placed on naproxen for this arm pain which he has continued to have. He was seen in the ED and EKG showed NSR with no acute ischemic changes, troponin neg, and CXR neg. He reports that his father had MIs-1st one in his 81s, and his mother had MI age 17.He is admitted for further eval and management.  Pt's BP was initially markedly elevated>>188/103 in the ED, he states he has been compliant with his meds since 9/30- wen they were refilled at urgent care(prior to to that he ahd been out of meds for 'a long while'.  Since d/c compliant with ACE  No chest pain or palpitations Always been big Use to power lift.  Seeing new primary next month  Echo in hospital reviewed 10/6 Study Conclusions  - Left ventricle: The cavity size was normal. Wall thickness was increased in a pattern of moderate LVH. Systolic function was normal. The estimated ejection fraction was in the range of 50% to 55%. Wall motion was normal; there were no regional wall motion abnormalities. - Mitral valve: Calcified annulus. Mildly thickened leaflets . Mild regurgitation. - Left atrium: The atrium was mildly dilated.    ROS: Denies fever, malais, weight loss, blurry vision, decreased visual acuity, cough, sputum, SOB, hemoptysis, pleuritic pain, palpitaitons, heartburn, abdominal pain, melena, lower extremity edema, claudication, or rash.  All other systems reviewed and  negative   General: Affect appropriate Healthy:  appears stated age HEENT: normal Neck supple with no adenopathy JVP normal no bruits no thyromegaly Lungs clear with no wheezing and good diaphragmatic motion Heart:  S1/S2 no murmur,rub, gallop or click PMI normal Abdomen: benighn, BS positve, no tenderness, no AAA no bruit.  No HSM or HJR Distal pulses intact with no bruits No edema Neuro non-focal Skin warm and dry No muscular weakness  Medications Current Outpatient Prescriptions  Medication Sig Dispense Refill  . Alum & Mag Hydroxide-Simeth (GI COCKTAIL) SUSP suspension Take 30 mLs by mouth 3 (three) times daily as needed for indigestion. Shake well.  30 mL  0  . aspirin 81 MG tablet Take 81 mg by mouth daily.      . cyclobenzaprine (FLEXERIL) 5 MG tablet Take 1-2 tablets (5-10 mg total) by mouth 3 (three) times daily as needed for muscle spasms.  30 tablet  0  . lisinopril (PRINIVIL,ZESTRIL) 10 MG tablet Take 1 tablet (10 mg total) by mouth daily.  30 tablet  11  . tamsulosin (FLOMAX) 0.4 MG CAPS capsule Take 1 capsule (0.4 mg total) by mouth daily after breakfast.  30 capsule  1   No current facility-administered medications for this visit.    Allergies Review of patient's allergies indicates no known allergies.  Family History: Family History  Problem Relation Age of Onset  . Heart failure Mother   . Lupus Mother   . Heart failure Father     Social History: History  Social History  . Marital Status: Married    Spouse Name: N/A    Number of Children: N/A  . Years of Education: N/A   Occupational History  . Not on file.   Social History Main Topics  . Smoking status: Former Games developer  . Smokeless tobacco: Not on file  . Alcohol Use: No  . Drug Use: No  . Sexual Activity: Not on file   Other Topics Concern  . Not on file   Social History Narrative  . No narrative on file    Electrocardiogram:  04/26/13 SR rate 90 LVH no acute ischemic changes    Assessment and Plan

## 2013-05-03 NOTE — Assessment & Plan Note (Signed)
At risk for PAF  Improved PAC;s on exam today  Add beta blocker and f/u in 6 weeks

## 2013-05-03 NOTE — Patient Instructions (Signed)
Your physician recommends that you schedule a follow-up appointment in: NEXT AVAILABLE WITH DR [facility]  Your physician has recommended you make the following change in your medication:   START METOPROLOL  50 MG  1 EVERY DAY

## 2013-05-03 NOTE — Assessment & Plan Note (Signed)
Better Discussed diet and exercise  Moderate LVH on echo Add beta blocker given PAC;s on exam and relatively high resting HR

## 2013-06-16 ENCOUNTER — Ambulatory Visit: Payer: Medicare Other | Admitting: Cardiovascular Disease

## 2013-07-19 ENCOUNTER — Ambulatory Visit (INDEPENDENT_AMBULATORY_CARE_PROVIDER_SITE_OTHER): Payer: Medicare Other | Admitting: Family Medicine

## 2013-07-19 ENCOUNTER — Encounter: Payer: Self-pay | Admitting: Family Medicine

## 2013-07-19 VITALS — BP 134/86 | HR 73 | Temp 98.4°F | Ht 72.5 in | Wt 275.0 lb

## 2013-07-19 DIAGNOSIS — R7309 Other abnormal glucose: Secondary | ICD-10-CM

## 2013-07-19 DIAGNOSIS — R7989 Other specified abnormal findings of blood chemistry: Secondary | ICD-10-CM

## 2013-07-19 DIAGNOSIS — N400 Enlarged prostate without lower urinary tract symptoms: Secondary | ICD-10-CM

## 2013-07-19 DIAGNOSIS — R739 Hyperglycemia, unspecified: Secondary | ICD-10-CM | POA: Insufficient documentation

## 2013-07-19 DIAGNOSIS — R799 Abnormal finding of blood chemistry, unspecified: Secondary | ICD-10-CM

## 2013-07-19 DIAGNOSIS — I10 Essential (primary) hypertension: Secondary | ICD-10-CM

## 2013-07-19 HISTORY — DX: Benign prostatic hyperplasia without lower urinary tract symptoms: N40.0

## 2013-07-19 LAB — COMPREHENSIVE METABOLIC PANEL
Albumin: 4 g/dL (ref 3.5–5.2)
Alkaline Phosphatase: 45 U/L (ref 39–117)
BUN: 13 mg/dL (ref 6–23)
CO2: 29 mEq/L (ref 19–32)
Calcium: 9.2 mg/dL (ref 8.4–10.5)
Chloride: 104 mEq/L (ref 96–112)
Creat: 1.4 mg/dL — ABNORMAL HIGH (ref 0.50–1.35)
Glucose, Bld: 87 mg/dL (ref 70–99)
Potassium: 4.3 mEq/L (ref 3.5–5.3)

## 2013-07-19 MED ORDER — TAMSULOSIN HCL 0.4 MG PO CAPS: 0.4000 mg | ORAL_CAPSULE | Freq: Every day | ORAL | Status: DC

## 2013-07-19 NOTE — Assessment & Plan Note (Signed)
Obese w/ hyperglycemia in the past.  A1c today CMET

## 2013-07-19 NOTE — Assessment & Plan Note (Signed)
Well controlled in clinic and no change to current therapy

## 2013-07-19 NOTE — Patient Instructions (Signed)
Thank you for coming in today You are doing very well overall Please continue all of your current medications Please call if you have any questions or concerns I will see you back in 6 months or sooner if needed Have a great New Year!

## 2013-07-19 NOTE — Assessment & Plan Note (Addendum)
After lengthy discussion on pros/cons we will obtain PSA today Continue flomax  ---------------------------------  PSA nml

## 2013-07-19 NOTE — Progress Notes (Signed)
Rosemary Pentecost is a 67 y.o. male who presents to [facility] today for new patient appointment  HTN: Denies CP, Palpitations, HA, SOB. Takes metop and lisinopril as prescribed.   CHF: followed by Dr. Eden Emms. On ACEi and bblocker adn ASA. No orthopnea. No LE edema  Urine flow: much improved since starting flomax. No dysuria, or frequency. Pees like nml after starting flomax.   Tobacco use: former  The following portions of the patient's history were reviewed and updated as appropriate: allergies, current medications, past medical history, family and social history, and problem list.   Past Medical History  Diagnosis Date  . Hypertension   . CHF (congestive heart failure)     ROS as above otherwise neg.    Medications reviewed. Current Outpatient Prescriptions  Medication Sig Dispense Refill  . aspirin 81 MG tablet Take 81 mg by mouth daily.      Marland Kitchen lisinopril (PRINIVIL,ZESTRIL) 10 MG tablet Take 1 tablet (10 mg total) by mouth daily.  30 tablet  11  . metoprolol succinate (TOPROL-XL) 50 MG 24 hr tablet Take 1 tablet (50 mg total) by mouth daily. Take with or immediately following a meal.  30 tablet  11  . tamsulosin (FLOMAX) 0.4 MG CAPS capsule Take 1 capsule (0.4 mg total) by mouth daily after breakfast.  90 capsule  3  . Alum & Mag Hydroxide-Simeth (GI COCKTAIL) SUSP suspension Take 30 mLs by mouth 3 (three) times daily as needed for indigestion. Shake well.  30 mL  0  . cyclobenzaprine (FLEXERIL) 5 MG tablet Take 1-2 tablets (5-10 mg total) by mouth 3 (three) times daily as needed for muscle spasms.  30 tablet  0   No current facility-administered medications for this visit.    Exam: BP 134/86  Pulse 73  Temp(Src) 98.4 F (36.9 C) (Oral)  Ht 6' 0.5" (1.842 m)  Wt 275 lb (124.739 kg)  BMI 36.76 kg/m2 Gen: Well NAD HEENT: EOMI,  MMM Lungs: CTABL Nl WOB Heart: RRR no MRG Abd: NABS, NT, ND Exts: Trace LE edema bilat Prostate: mildly uniformly enlarged  No results found for this  or any previous visit (from the past 72 hour(s)).  A/P (as seen in Problem list)  Hyperglycemia Obese w/ hyperglycemia in the past.  A1c today CMET  BPH (benign prostatic hyperplasia) After lengthy discussion on pros/cons we will obtain PSA today Continue flomax  HTN (hypertension) Well controlled in clinic and no change to current therapy    Greater than 45 min in direct pt care

## 2013-07-20 ENCOUNTER — Ambulatory Visit (INDEPENDENT_AMBULATORY_CARE_PROVIDER_SITE_OTHER): Payer: Medicare Other | Admitting: Cardiovascular Disease

## 2013-07-20 ENCOUNTER — Encounter: Payer: Self-pay | Admitting: Cardiovascular Disease

## 2013-07-20 VITALS — BP 130/80 | HR 74 | Ht 72.0 in | Wt 275.0 lb

## 2013-07-20 DIAGNOSIS — R079 Chest pain, unspecified: Secondary | ICD-10-CM

## 2013-07-20 DIAGNOSIS — I10 Essential (primary) hypertension: Secondary | ICD-10-CM

## 2013-07-20 NOTE — Assessment & Plan Note (Signed)
Resolved no evidence of CAD  ECG normal except for LVH

## 2013-07-20 NOTE — Assessment & Plan Note (Signed)
Resolved continue beta blocker 

## 2013-07-20 NOTE — Progress Notes (Signed)
Patient ID: Timothy Sheppard, male   DOB: [birth year], 67 y.o.   MRN: [id number] 67 yo seen in [facility] recently for chest pain Referred for f/u.  Initially Rx for bronchitis with antibiotics He states for the past week or so he has been having chest pain described as soreness, sharp sometimes, 7/10 in intensity at its worst and associated with SOB. He admits to feeling clammy with the pain, and that for the past 1&1/2 weeks hea has had pain down his LUE with parasthesias in his fingers. When whe was seen at the urgent care as above on 9/30 he was placed on naproxen for this arm pain which he has continued to have. He was seen in the ED and EKG showed NSR with no acute ischemic changes, troponin neg, and CXR neg. He reports that his father had MIs-1st one in his 45s, and his mother had MI age 26.He is admitted for further eval and management.  Pt's BP was initially markedly elevated>>188/103 in the ED, he states he has been compliant with his meds since 9/30- wen they were refilled at urgent care(prior to to that he ahd been out of meds for 'a long while'.  Since d/c compliant with ACE No chest pain or palpitations Always been big Use to power lift. Saw new primary this week and had blood work  Echo in hospital reviewed 10/6  Study Conclusions  - Left ventricle: The cavity size was normal. Wall thickness was increased in a pattern of moderate LVH. Systolic function was normal. The estimated ejection fraction was in the range of 50% to 55%. Wall motion was normal; there were no regional wall motion abnormalities. - Mitral valve: Calcified annulus. Mildly thickened leaflets . Mild regurgitation. - Left atrium: The atrium was mildly dilated.     ROS: Denies fever, malais, weight loss, blurry vision, decreased visual acuity, cough, sputum, SOB, hemoptysis, pleuritic pain, palpitaitons, heartburn, abdominal pain, melena, lower extremity edema, claudication, or rash.  All other systems reviewed and  negative  General: Affect appropriate Healthy:  appears stated age HEENT: normal Neck supple with no adenopathy JVP normal no bruits no thyromegaly Lungs clear with no wheezing and good diaphragmatic motion Heart:  S1/S2 no murmur, no rub, gallop or click PMI normal Abdomen: benighn, BS positve, no tenderness, no AAA no bruit.  No HSM or HJR Distal pulses intact with no bruits No edema Neuro non-focal Skin warm and dry No muscular weakness   Current Outpatient Prescriptions  Medication Sig Dispense Refill  . Alum & Mag Hydroxide-Simeth (GI COCKTAIL) SUSP suspension Take 30 mLs by mouth 3 (three) times daily as needed for indigestion. Shake well.  30 mL  0  . aspirin 81 MG tablet Take 81 mg by mouth daily.      . cyclobenzaprine (FLEXERIL) 5 MG tablet Take 1-2 tablets (5-10 mg total) by mouth 3 (three) times daily as needed for muscle spasms.  30 tablet  0  . lisinopril (PRINIVIL,ZESTRIL) 10 MG tablet Take 1 tablet (10 mg total) by mouth daily.  30 tablet  11  . metoprolol succinate (TOPROL-XL) 50 MG 24 hr tablet Take 1 tablet (50 mg total) by mouth daily. Take with or immediately following a meal.  30 tablet  11  . tamsulosin (FLOMAX) 0.4 MG CAPS capsule Take 1 capsule (0.4 mg total) by mouth daily after breakfast.  90 capsule  3   No current facility-administered medications for this visit.    Allergies  Review of patient's allergies indicates no  known allergies.  Electrocardiogram:  10/6  SR rate 90  LVH   Assessment and Plan

## 2013-07-20 NOTE — Patient Instructions (Signed)
Your physician wants you to follow-up in: YEAR WITH DR NISHAN  You will receive a reminder letter in the mail two months in advance. If you don't receive a letter, please call our office to schedule the follow-up appointment.  Your physician recommends that you continue on your current medications as directed. Please refer to the Current Medication list given to you today. 

## 2013-07-20 NOTE — Assessment & Plan Note (Signed)
Well controlled.  Continue current medications and low sodium Dash type diet.    

## 2013-07-21 DIAGNOSIS — R7989 Other specified abnormal findings of blood chemistry: Secondary | ICD-10-CM | POA: Insufficient documentation

## 2013-07-21 DIAGNOSIS — Z87442 Personal history of urinary calculi: Secondary | ICD-10-CM | POA: Insufficient documentation

## 2013-07-21 HISTORY — DX: Other specified abnormal findings of blood chemistry: R79.89

## 2013-07-21 NOTE — Assessment & Plan Note (Addendum)
Elevated Cr on last BMET Etiology unclear. May be due to ACEi Will need to investigate further. Hesitant to Dc due to h/o CHF and CP.  Pt to call back to discuss DC and recheck and add additional HTN coverage vs wait and watch w/ recheck of labs

## 2013-10-01 ENCOUNTER — Telehealth: Payer: Self-pay | Admitting: *Deleted

## 2013-10-01 NOTE — Telephone Encounter (Signed)
LVM for patient to call back to follow up on flu immunization.  

## 2013-10-04 NOTE — Telephone Encounter (Signed)
Patient has received it

## 2014-04-26 ENCOUNTER — Other Ambulatory Visit: Payer: Self-pay | Admitting: *Deleted

## 2014-04-28 MED ORDER — LISINOPRIL 10 MG PO TABS: 10.0000 mg | ORAL_TABLET | Freq: Every day | ORAL | Status: DC

## 2014-05-11 ENCOUNTER — Ambulatory Visit (INDEPENDENT_AMBULATORY_CARE_PROVIDER_SITE_OTHER): Payer: Medicare Other | Admitting: *Deleted

## 2014-05-11 DIAGNOSIS — Z23 Encounter for immunization: Secondary | ICD-10-CM | POA: Diagnosis not present

## 2014-05-11 NOTE — Progress Notes (Signed)
   Pt in nurse clinic for tdap.  Pt stated he stuck himself with a hook yesterday 05/10/2014.  Pt advised he should have area looked at.  Pt stated he has an appt with PCP 05/23/2014.  Pt advised to keep area clean, call for same day appt with increase swelling, redness or develops a fever.  Pt stated understanding.  Derl Barrow, RN

## 2014-05-12 NOTE — Progress Notes (Signed)
Thanks Tamika. Will keep in mind at his next appointment.   Paula Compton, MD

## 2014-05-23 ENCOUNTER — Ambulatory Visit (INDEPENDENT_AMBULATORY_CARE_PROVIDER_SITE_OTHER): Payer: Medicare Other | Admitting: Family Medicine

## 2014-05-23 ENCOUNTER — Encounter: Payer: Self-pay | Admitting: Family Medicine

## 2014-05-23 VITALS — BP 154/98 | HR 71 | Temp 98.1°F | Wt 271.0 lb

## 2014-05-23 DIAGNOSIS — N400 Enlarged prostate without lower urinary tract symptoms: Secondary | ICD-10-CM

## 2014-05-23 DIAGNOSIS — I10 Essential (primary) hypertension: Secondary | ICD-10-CM

## 2014-05-23 DIAGNOSIS — R079 Chest pain, unspecified: Secondary | ICD-10-CM

## 2014-05-23 LAB — BASIC METABOLIC PANEL
BUN: 11 mg/dL (ref 6–23)
CO2: 26 mEq/L (ref 19–32)
Calcium: 8.9 mg/dL (ref 8.4–10.5)
Chloride: 105 mEq/L (ref 96–112)
Creat: 1.1 mg/dL (ref 0.50–1.35)
Glucose, Bld: 98 mg/dL (ref 70–99)
Potassium: 4.3 mEq/L (ref 3.5–5.3)
Sodium: 140 mEq/L (ref 135–145)

## 2014-05-24 ENCOUNTER — Telehealth: Payer: Self-pay | Admitting: Family Medicine

## 2014-05-24 MED ORDER — TAMSULOSIN HCL 0.4 MG PO CAPS: 0.4000 mg | ORAL_CAPSULE | Freq: Every day | ORAL | Status: DC

## 2014-05-24 MED ORDER — NITROGLYCERIN 0.4 MG SL SUBL: 0.4000 mg | SUBLINGUAL_TABLET | SUBLINGUAL | Status: DC | PRN

## 2014-05-24 MED ORDER — LISINOPRIL 20 MG PO TABS: 20.0000 mg | ORAL_TABLET | Freq: Every day | ORAL | Status: DC

## 2014-05-24 NOTE — Assessment & Plan Note (Signed)
A: Pt. With elevated blood pressure 154 / 94 and the same on repeat manual BP. He has been on Lisinopril for some time. He reports compliance with his medications at home.   P:  - Currently on Lisinopril 10 mg.  - Will get BMP since last Cr. Was 1.4. If Cr.  is improved / stable then will increase his lisinopril dose in order to improve his blood pressure contrl.  - Will increase lisinopril to 20mg  qd. For better control if Creatinine is stable.

## 2014-05-24 NOTE — Progress Notes (Signed)
Patient ID: Timothy Sheppard, male   DOB: [birth year], 68 y.o.   MRN: [id number]   [facility] Aquilla Hacker, MD Phone: [phone]  Subjective:   # Hypertension - Pt. Reports that he has been compliant with his medications.  - He took his medicine this am.  - He denies changes in vision or palpitations.  - He does not take his blood pressure at home.   # Intermittent Chest Pain with Exertion  - Pt. Reports episodes of left sided, nonradiating chest pain while working out in his yard. He reports that these episodes last 1-2 minutes, and are associated with shortness of breath. - Episodes are relieved by rest.  - No associated nausea, neck pain, arm pain, shoulder pain, diaphoresis.  - He says that the episodes occur strictly with exertion.  - He denies orthopnea, pre or post prandial pain.  - He reports a history of CAD workup including remote history of chemical stress test and catheterization that he says were negative at that time.  - He is taking a daily aspirin.  - He has a history of smoking pipes which he did every day, however he quit >10 years ago.   # Abdominal Pain  - pt. With mild intermittent LUQ abdominal pain that IS occurring periprandially.  - He reports that his pain is very mild. He reports increase in NSAID use outside of his daily aspirin use. He says that he has used Advil, and Alleve more frequently. Advil PM to help him sleep.  - He also associates this pain with ingestion of fatty foods such as bacon and sausage.  - Denies blood in stool, dark stool, constipation, or diarrhea. He denies weight loss, or loss of appetite.  - He otherwise reports that the pain resolves on its own.   All relevant systems were reviewed and were negative unless otherwise noted in the HPI  Past Medical History Reviewed problem list.  Medications- reviewed and updated Current Outpatient Prescriptions  Medication Sig Dispense Refill  . Alum & Mag  Hydroxide-Simeth (GI COCKTAIL) SUSP suspension Take 30 mLs by mouth 3 (three) times daily as needed for indigestion. Shake well. 30 mL 0  . aspirin 81 MG tablet Take 81 mg by mouth daily.    . cyclobenzaprine (FLEXERIL) 5 MG tablet Take 1-2 tablets (5-10 mg total) by mouth 3 (three) times daily as needed for muscle spasms. 30 tablet 0  . lisinopril (PRINIVIL,ZESTRIL) 20 MG tablet Take 1 tablet (20 mg total) by mouth daily. 90 tablet 6  . metoprolol succinate (TOPROL-XL) 50 MG 24 hr tablet Take 1 tablet (50 mg total) by mouth daily. Take with or immediately following a meal. 30 tablet 11  . tamsulosin (FLOMAX) 0.4 MG CAPS capsule Take 1 capsule (0.4 mg total) by mouth daily after breakfast. 90 capsule 5   No current facility-administered medications for this visit.   Chief complaint-noted No additions to family history Social history- patient is a former daily pipe smoker. Quit > 10 years ago.   Objective: BP 154/98 mmHg  Pulse 71  Temp(Src) 98.1 F (36.7 C) (Oral)  Wt 271 lb (122.925 kg) Gen: NAD, alert, cooperative with exam HEENT: NCAT, EOMI, PERRL, MMM, O/P clear Neck: FROM, supple, No LAD CV: RRR, good S1/S2, no murmur, Gallop, or rub. Appropriate PMI, Nontender to palpation.  Resp: CTABL, no wheezes, non-labored, appropriate rate.  Abd: SNTND, BS present, no guarding or organomegaly Ext: No edema, warm, normal tone, moves UE/LE spontaneously Neuro:  Alert and oriented, No gross deficits Skin: no rashes no lesions  Assessment/Plan: See problem based a/p

## 2014-05-24 NOTE — Telephone Encounter (Signed)
Pt informed. Fleeger, Jessica Dawn  

## 2014-05-24 NOTE — Assessment & Plan Note (Signed)
A: Pt. Reports increased episodes of SOB / L sided chest pain with exertion (i.e. Working in the yard). Chest pain sounds typical for cardiac etiology. He has a remote, but undocumented history of Cardiac catheterization (negative according to him), and nuclear stress test (also negative according to him). He is followed by Dr. Johnsie Cancel (Cardiology) who saw him one year ago. He is due for another follow up appointment with cardiology at this time.   P:  - Chest pain history and physical exam consistent with cardiac etiology of chest pain - Pt. Instructed of warning signs and instructions to go to the ED immediately if he experiences signs / symptoms consistent with heart attack.  - No active chest pain today.  - Will get follow up appointment with cardiology for further evaluation, and will defer decision to stress test to Dr. Johnsie Cancel.  - Last lipid panel in 06/2013. At that time cholesterol was very well controlled without medication. Will defer decision to get new lipid panel for risk stratification to Dr. Johnsie Cancel if it is felt that further workup is necessary.  - Will start him on a trial of prn nitro with chest pain. Instructed to inform Dr. Johnsie Cancel whether his symptoms improve with nitroglycerin.  - Continue daily ASA 81mg .

## 2014-05-24 NOTE — Telephone Encounter (Signed)
Attempted to call pt. Regarding normal creatinine at [facility] from his most recent office visit. No answer. If patient calls, please inform him of normal Creatinine, and that as a result I have increased his dose of lisinopril to 20mg  daily to help control his blood pressure.

## 2014-06-07 ENCOUNTER — Other Ambulatory Visit: Payer: Self-pay | Admitting: Cardiovascular Disease

## 2014-06-26 NOTE — Progress Notes (Signed)
Patient ID: Timothy Sheppard, male   DOB: [birth year], 68 y.o.   MRN: [id number] 68 yo seen in [facility] recently for chest pain Referred for f/u.  Initially Rx for bronchitis with antibiotics  Seen at the urgent care as above on 9/30 he was placed on naproxen for this arm pain which he has continued to have. He was seen in the ED and EKG showed NSR with no acute ischemic changes, troponin neg, and CXR neg. He reports that his father had MIs-1st one in his 40s, and his mother had MI age 54.He is admitted for further eval and management.   Pt's BP was initially markedly elevated>>188/103 in the ED, he states he has been compliant with his meds since 9/30- wen they were refilled at urgent care(prior to to that he ahd been out of meds for 'a long while'.  Since d/c compliant with ACE No chest pain or palpitations Always been big Use to power lift. Saw new primary this week and had blood work  Echo in hospital reviewed 10/6 /14 Study Conclusions  - Left ventricle: The cavity size was normal. Wall thickness was increased in a pattern of moderate LVH. Systolic function was normal. The estimated ejection fraction was in the range of 50% to 55%. Wall motion was normal; there were no regional wall motion abnormalities. - Mitral valve: Calcified annulus. Mildly thickened leaflets . Mild regurgitation. - Left atrium: The atrium was mildly dilated.   05/24/14 saw primary and complained of more left sided chest pain and dyspnea Given PRN SL nitro    ROS: Denies fever, malais, weight loss, blurry vision, decreased visual acuity, cough, sputum, SOB, hemoptysis, pleuritic pain, palpitaitons, heartburn, abdominal pain, melena, lower extremity edema, claudication, or rash.  All other systems reviewed and negative  General: Affect appropriate Healthy:  appears stated age 89: normal Neck supple with no adenopathy JVP normal no bruits no thyromegaly Lungs clear with no wheezing and good diaphragmatic  motion Heart:  S1/S2 no murmur, no rub, gallop or click PMI normal Abdomen: benighn, BS positve, no tenderness, no AAA no bruit.  No HSM or HJR Distal pulses intact with no bruits No edema Neuro non-focal Skin warm and dry No muscular weakness   Current Outpatient Prescriptions  Medication Sig Dispense Refill  . Alum & Mag Hydroxide-Simeth (GI COCKTAIL) SUSP suspension Take 30 mLs by mouth 3 (three) times daily as needed for indigestion. Shake well. 30 mL 0  . aspirin 81 MG tablet Take 81 mg by mouth daily.    . cyclobenzaprine (FLEXERIL) 5 MG tablet Take 1-2 tablets (5-10 mg total) by mouth 3 (three) times daily as needed for muscle spasms. 30 tablet 0  . lisinopril (PRINIVIL,ZESTRIL) 20 MG tablet Take 1 tablet (20 mg total) by mouth daily. 90 tablet 6  . metoprolol succinate (TOPROL-XL) 50 MG 24 hr tablet TAKE ONE TABLET BY MOUTH ONCE DAILY TAKE WITH OR IMMEDIATELY FOLLOW A MEAL 30 tablet 0  . nitroGLYCERIN (NITROSTAT) 0.4 MG SL tablet Place 1 tablet (0.4 mg total) under the tongue every 5 (five) minutes as needed for chest pain. 50 tablet 3  . tamsulosin (FLOMAX) 0.4 MG CAPS capsule Take 1 capsule (0.4 mg total) by mouth daily after breakfast. 90 capsule 5   No current facility-administered medications for this visit.    Allergies  Review of patient's allergies indicates no known allergies.  Electrocardiogram:  04/26/13  SR voltage criteria for LVH  Today SR rate 63 normal   Assessment and Plan

## 2014-06-27 ENCOUNTER — Ambulatory Visit (INDEPENDENT_AMBULATORY_CARE_PROVIDER_SITE_OTHER): Payer: Medicare Other | Admitting: Cardiovascular Disease

## 2014-06-27 ENCOUNTER — Encounter: Payer: Self-pay | Admitting: Cardiovascular Disease

## 2014-06-27 VITALS — BP 124/82 | HR 63 | Ht 72.0 in | Wt 289.1 lb

## 2014-06-27 DIAGNOSIS — I10 Essential (primary) hypertension: Secondary | ICD-10-CM

## 2014-06-27 DIAGNOSIS — R079 Chest pain, unspecified: Secondary | ICD-10-CM

## 2014-06-27 DIAGNOSIS — R739 Hyperglycemia, unspecified: Secondary | ICD-10-CM

## 2014-06-27 DIAGNOSIS — R072 Precordial pain: Secondary | ICD-10-CM

## 2014-06-27 NOTE — Assessment & Plan Note (Signed)
Discussed low carb diet.  Target hemoglobin A1c is 6.5 or less.  Continue current medications.  

## 2014-06-27 NOTE — Assessment & Plan Note (Signed)
Well controlled.  Continue current medications and low sodium Dash type diet.    

## 2014-06-27 NOTE — Assessment & Plan Note (Signed)
Recurrent this time mild pleuritic component  ECG normal but cannot walk on treadmill due to knee pain. Has had "chemical" stress test in [place] over 8 years ago  Will order lexiscan myovue

## 2014-06-27 NOTE — Patient Instructions (Signed)
Your physician wants you to follow-up in:   Timothy Sheppard will receive a reminder letter in the mail two months in advance. If you don't receive a letter, please call our office to schedule the follow-up appointment. Your physician recommends that you continue on your current medications as directed. Please refer to the Current Medication list given to you today. Your physician has requested that you have a lexiscan myoview. For further information please visit [email or web address]. Please follow instruction sheet, as given.

## 2014-07-05 ENCOUNTER — Other Ambulatory Visit: Payer: Self-pay | Admitting: Cardiovascular Disease

## 2014-07-18 ENCOUNTER — Ambulatory Visit (HOSPITAL_COMMUNITY): Payer: Medicare Other | Attending: Cardiology | Admitting: Radiology

## 2014-07-18 DIAGNOSIS — R079 Chest pain, unspecified: Secondary | ICD-10-CM | POA: Diagnosis present

## 2014-07-18 DIAGNOSIS — I10 Essential (primary) hypertension: Secondary | ICD-10-CM | POA: Diagnosis not present

## 2014-07-18 MED ORDER — TECHNETIUM TC 99M SESTAMIBI GENERIC - CARDIOLITE
30.0000 | Freq: Once | INTRAVENOUS | Status: AC | PRN
  Administered 2014-07-18: 30 via INTRAVENOUS

## 2014-07-18 MED ORDER — REGADENOSON 0.4 MG/5ML IV SOLN
0.4000 mg | Freq: Once | INTRAVENOUS | Status: AC
  Administered 2014-07-18: 0.4 mg via INTRAVENOUS

## 2014-07-18 MED ORDER — TECHNETIUM TC 99M SESTAMIBI GENERIC - CARDIOLITE
10.0000 | Freq: Once | INTRAVENOUS | Status: AC | PRN
  Administered 2014-07-18: 10 via INTRAVENOUS

## 2014-07-18 NOTE — Progress Notes (Signed)
[facility] [place], [place] [address] [phone]    Cardiology Nuclear Med Study  Timothy Sheppard is a 68 y.o. male     MRN : [id number]     DOB: 1946/02/13  Procedure Date: 07/18/2014  Nuclear Med Background Indication for Stress Test:  Evaluation for Ischemia History:  MPI 2012 (normal) Cardiac Risk Factors: Hypertension  Symptoms:  Chest Pain   Nuclear Pre-Procedure Caffeine/Decaff Intake:  None> 12 hrs NPO After: 3:00pm   Lungs:  clear O2 Sat: 97% on room air. IV 0.9% NS with Angio Cath:  22g  IV Site: R Antecubital x 1, tolerated well IV Started by:  Irven Baltimore, RN  Chest Size (in):  54 Cup Size: n/a  Height: 6' (1.829 m)  Weight:  288 lb (130.636 kg)  BMI:  Body mass index is 39.05 kg/(m^2). Tech Comments:  Patient took Lisinopril and Toprol this am. Irven Baltimore, RN.    Nuclear Med Study 1 or 2 day study: 1 day  Stress Test Type:  Treadmill/Lexiscan  Reading MD: N/A  Order Authorizing Provider:  Jenkins Rouge, MD  Resting Radionuclide: Technetium 46m Sestamibi  Resting Radionuclide Dose: 11.0 mCi   Stress Radionuclide:  Technetium 22m Sestamibi  Stress Radionuclide Dose: 33.0 mCi           Stress Protocol Rest HR: 50 Stress HR: 110  Rest BP: 146/95 Stress BP: 76/51  Exercise Time (min): n/a METS: n/a   Predicted Max HR: 154 bpm % Max HR: 71.43 bpm Rate Pressure Product: 18480   Dose of Adenosine (mg):  n/a Dose of Lexiscan: 0.4 mg  Dose of Atropine (mg): n/a Dose of Dobutamine: n/a mcg/kg/min (at max HR)  Stress Test Technologist: Glade Lloyd, BS-ES  Nuclear Technologist:  Earl Many, CNMT     Rest Procedure:  Myocardial perfusion imaging was performed at rest 45 minutes following the intravenous administration of Technetium 40m Sestamibi. Rest ECG: NSR - Normal EKG  Stress Procedure:  The patient received IV Lexiscan 0.4 mg over 15-seconds with concurrent low level exercise and then Technetium 65m  Sestamibi was injected at 30-seconds while the patient continued walking one more minute.  Quantitative spect images were obtained after a 45-minute delay.  During the infusion of Lexiscan the patient complained of lightheadedness, chest tightness and SOB. These symptoms resolved in recovery.  Stress ECG: No significant change from baseline ECG  QPS Raw Data Images:  Normal; no motion artifact; normal heart/lung ratio. Stress Images:  Normal homogeneous uptake in all areas of the myocardium. Rest Images:  Normal homogeneous uptake in all areas of the myocardium. Subtraction (SDS):  No evidence of ischemia. Transient Ischemic Dilatation (Normal <1.22):  1.02 Lung/Heart Ratio (Normal <0.45):  0.31  Quantitative Gated Spect Images QGS EDV:  174 ml QGS ESV:  85 ml  Impression Exercise Capacity:  Lexiscan with low level exercise. BP Response:  Normal blood pressure response. Clinical Symptoms:  No significant symptoms noted. ECG Impression:  No significant ST segment change suggestive of ischemia. Comparison with Prior Nuclear Study: No images to compare.  By report, the EF has improved since the previous study in 2001.   Overall Impression:  Normal stress nuclear study.  LV Ejection Fraction: 51%.  LV Wall Motion:  NL LV Function; NL Wall Motion .   Thayer Headings, Brooke Bonito., MD, [facility] 07/18/2014, 5:01 PM [address]  [place] Pager [phone]

## 2014-07-19 ENCOUNTER — Telehealth: Payer: Self-pay | Admitting: Cardiovascular Disease

## 2014-07-19 NOTE — Telephone Encounter (Signed)
PT'S WIFE  AWARE OF MYOVIEW RESULTS .Adonis Housekeeper

## 2014-07-19 NOTE — Telephone Encounter (Signed)
New message ° ° ° °Want nuclear stress results °

## 2015-04-11 ENCOUNTER — Ambulatory Visit (INDEPENDENT_AMBULATORY_CARE_PROVIDER_SITE_OTHER): Payer: Medicare Other | Admitting: *Deleted

## 2015-04-11 DIAGNOSIS — Z23 Encounter for immunization: Secondary | ICD-10-CM | POA: Diagnosis not present

## 2015-06-22 ENCOUNTER — Encounter: Payer: Self-pay | Admitting: *Deleted

## 2015-06-23 NOTE — Progress Notes (Signed)
Patient ID: Timothy Sheppard, male   DOB: [birth year], 69 y.o.   MRN: [id number] 69 y.o.  seen in ER  recently for chest pain Referred for f/u.   Initially Rx for bronchitis with antibiotics  Seen at the urgent care as above on 9/30 he was placed on naproxen for this arm pain which he has continued to have. He was seen in the ED and EKG showed NSR with no acute ischemic changes, troponin neg, and CXR neg. He reports that his father had MIs-1st one in his 36s, and his mother had MI age 32.He is admitted for further eval and management.   Pt's BP was initially markedly elevated>>188/103 in the ED, he states he has been compliant with his meds since 9/30- wen they were refilled at urgent care(prior to to that he ahd been out of meds for 'a long while'.  Since d/c compliant with ACE No chest pain or palpitations Always been big Use to power lift. Saw new primary this week and had blood work   Echo in hospital reviewed 10/6 /14 Study Conclusions  - Left ventricle: The cavity size was normal. Wall thickness was increased in a pattern of moderate LVH. Systolic function was normal. The estimated ejection fraction was in the range of 50% to 55%. Wall motion was normal; there were no regional wall motion abnormalities. - Mitral valve: Calcified annulus. Mildly thickened leaflets . Mild regurgitation. - Left atrium: The atrium was mildly dilated.   05/24/14 saw primary and complained of more left sided chest pain and dyspnea Given PRN SL nitro   07/19/15  Myovue normal with no ischemia EF 51%  Some ED.  Told him it was ok to use viagra.  Some LE edema dependant   ROS: Denies fever, malais, weight loss, blurry vision, decreased visual acuity, cough, sputum, SOB, hemoptysis, pleuritic pain, palpitaitons, heartburn, abdominal pain, melena, lower extremity edema, claudication, or rash.  All other systems reviewed and negative  General: Affect appropriate Healthy:  appears stated age 69:  normal Neck supple with no adenopathy JVP normal no bruits no thyromegaly Lungs clear with no wheezing and good diaphragmatic motion Heart:  S1/S2 no murmur, no rub, gallop or click PMI normal Abdomen: benighn, BS positve, no tenderness, no AAA no bruit.  No HSM or HJR Distal pulses intact with no bruits No edema Neuro non-focal Skin warm and dry No muscular weakness   Current Outpatient Prescriptions  Medication Sig Dispense Refill  . aspirin 81 MG tablet Take 81 mg by mouth daily.    . metoprolol succinate (TOPROL-XL) 50 MG 24 hr tablet TAKE ONE TABLET BY MOUTH ONCE DAILY TAKE  WITH  OR  IMMEDIATELY  FOLLOWING  A  MEAL 30 tablet 11  . nitroGLYCERIN (NITROSTAT) 0.4 MG SL tablet Place 0.4 mg under the tongue every 5 (five) minutes as needed for chest pain (3 doses max).     No current facility-administered medications for this visit.    Allergies  Review of patient's allergies indicates no known allergies.  Electrocardiogram:  04/26/13  SR voltage criteria for LVH  Today SR rate 63 normal 06/28/15  SB rate 53 T wave inversion 3,F   Assessment and Plan  HTN: dc lisinopril start hyzaar 25/12/5  BMET in 3 weeks   Chest Pain: resolved myovue normal observe Prostate f/u primary PSA  Can consider floxmax per primary in future   Lab Results  Component Value Date   PSA 0.30 07/19/2013    BMET Hyzaar instead of  lisinopril F/u 6 months    Jenkins Rouge

## 2015-06-27 ENCOUNTER — Encounter: Payer: Self-pay | Admitting: Cardiovascular Disease

## 2015-06-28 ENCOUNTER — Encounter: Payer: Self-pay | Admitting: Cardiovascular Disease

## 2015-06-28 ENCOUNTER — Ambulatory Visit (INDEPENDENT_AMBULATORY_CARE_PROVIDER_SITE_OTHER): Payer: Medicare Other | Admitting: Cardiovascular Disease

## 2015-06-28 VITALS — BP 128/90 | HR 53 | Ht 72.0 in | Wt 290.4 lb

## 2015-06-28 DIAGNOSIS — I10 Essential (primary) hypertension: Secondary | ICD-10-CM | POA: Diagnosis not present

## 2015-06-28 MED ORDER — METOPROLOL SUCCINATE ER 50 MG PO TB24: ORAL_TABLET | ORAL | Status: DC

## 2015-06-28 MED ORDER — LOSARTAN POTASSIUM-HCTZ 50-12.5 MG PO TABS: 1.0000 | ORAL_TABLET | Freq: Every day | ORAL | Status: DC

## 2015-06-28 NOTE — Patient Instructions (Addendum)
Medication Instructions:  Your physician has recommended you make the following change in your medication:  1-STOP Lisinopril 2-START Hyzaar 50/12.5 mg by mouth daily   Labwork: Your physician recommends that you return for lab work in: 3 weeks BMET  Testing/Procedures: NONE  Follow-Up: Your physician wants you to follow-up in: 3 months with Dr. Johnsie Cancel. You will receive a reminder letter in the mail two months in advance. If you don't receive a letter, please call our office to schedule the follow-up appointment.   If you need a refill on your cardiac medications before your next appointment, please call your pharmacy.

## 2015-07-04 ENCOUNTER — Other Ambulatory Visit: Payer: Self-pay

## 2015-07-04 ENCOUNTER — Telehealth: Payer: Self-pay | Admitting: Cardiovascular Disease

## 2015-07-04 NOTE — Telephone Encounter (Signed)
Spoke with pt's wife and pt resumed Lisinopril 20 mg daily because he could not afford Hyzaar

## 2015-07-04 NOTE — Telephone Encounter (Signed)
New message      Calling to let Dr Johnsie Cancel know that he cannot afford losartan-HCTZ so he has restarted his lisinopril.

## 2015-07-04 NOTE — Telephone Encounter (Signed)
Medication Detail      Disp Refills Start End     losartan-hydrochlorothiazide (HYZAAR) 50-12.5 MG tablet 30 tablet 11 06/28/2015     Sig - Route: Take 1 tablet by mouth daily. - Oral    E-Prescribing Status: Receipt confirmed by pharmacy (06/28/2015 8:31 AM EST)     Pharmacy    [facility] [place], [place].

## 2015-07-19 ENCOUNTER — Other Ambulatory Visit (INDEPENDENT_AMBULATORY_CARE_PROVIDER_SITE_OTHER): Payer: Medicare Other

## 2015-07-19 DIAGNOSIS — I10 Essential (primary) hypertension: Secondary | ICD-10-CM | POA: Diagnosis not present

## 2015-07-19 NOTE — Addendum Note (Signed)
Addended by: Eulis Foster on: 07/19/2015 07:35 AM   Modules accepted: Orders

## 2015-07-26 ENCOUNTER — Other Ambulatory Visit: Payer: Self-pay | Admitting: Cardiovascular Disease

## 2015-08-25 ENCOUNTER — Encounter: Payer: Self-pay | Admitting: Student

## 2015-08-25 ENCOUNTER — Ambulatory Visit (INDEPENDENT_AMBULATORY_CARE_PROVIDER_SITE_OTHER): Payer: Medicare Other | Admitting: Student

## 2015-08-25 VITALS — BP 144/73 | HR 72 | Temp 98.2°F | Wt 294.8 lb

## 2015-08-25 DIAGNOSIS — J309 Allergic rhinitis, unspecified: Secondary | ICD-10-CM

## 2015-08-25 DIAGNOSIS — R05 Cough: Secondary | ICD-10-CM

## 2015-08-25 DIAGNOSIS — R059 Cough, unspecified: Secondary | ICD-10-CM | POA: Insufficient documentation

## 2015-08-25 HISTORY — DX: Allergic rhinitis, unspecified: J30.9

## 2015-08-25 MED ORDER — CETIRIZINE HCL 10 MG PO TABS: 10.0000 mg | ORAL_TABLET | Freq: Every day | ORAL | Status: DC

## 2015-08-25 NOTE — Assessment & Plan Note (Signed)
History and physical exam consistent with postnasal drip as a cause of his cough likely secondary to allergic rhinitis. Unlikely due to an infectious cause given lack of fever, well appearing. COPD also on the differential, however patient has not had PFTs and currently does not smoke extensively - We'll treat with cetirizine - Discussed using Tessalon Perles for cough suppression, however patient prefers to try over-the-counter cough syrup first - We'll continue to watch closely for resolution. If cough does not improve or gets worse consider chest x-ray, PFTs to evaluate for underlying lung dysfunction like COPD

## 2015-08-25 NOTE — Progress Notes (Signed)
   Subjective:    Patient ID: Timothy Sheppard, male    DOB: March 10, 1946, 70 y.o.   MRN: [id number]   CC: Cough  HPI: 70 year old male presenting for cough  Cough - Has been present for approximately 1 month - Occasionally has yellow sputum production with cough - Denies shortness of breath or chest pain - He feels as if mucus is running down the back of his throat - He has been taking Mucinex which has not helped his cough - He denies ever having cough like this before - He only smokes a pipe occasionally not daily - Denies fevers, chills associated with a cough.  - Reports he feels well other than the cough  Review of Systems ROS Per the history of present illness, otherwise denies nausea, vomiting, diarrhea, headache, weakness  Past Medical, Surgical, Social, and Family History Reviewed & Updated per EMR.   Objective:  BP 144/73 mmHg  Pulse 72  Temp(Src) 98.2 F (36.8 C) (Oral)  Wt 294 lb 12.8 oz (133.72 kg) Vitals and nursing note reviewed  General: NAD HEENT: Normal oropharynx without erythema or lesions or swelling, no cervical or submandibular lymphadenopathy, EOMI Cardiac: RRR,  Respiratory: CTAB, normal effort Skin: warm and dry, no rashes noted Neuro: alert and oriented, no focal deficits   Assessment & Plan:    Allergic rhinitis History and physical exam consistent with postnasal drip as a cause of his cough likely secondary to allergic rhinitis. Unlikely due to an infectious cause given lack of fever, well appearing. COPD also on the differential, however patient has not had PFTs and currently does not smoke extensively - We'll treat with cetirizine - Discussed using Tessalon Perles for cough suppression, however patient prefers to try over-the-counter cough syrup first - We'll continue to watch closely for resolution. If cough does not improve or gets worse consider chest x-ray, PFTs to evaluate for underlying lung dysfunction like COPD     Braniyah Besse A.  Lincoln Brigham MD, [facility] Resident PGY-2 Pager [phone]

## 2015-08-25 NOTE — Patient Instructions (Signed)
Follow up with PCP as needed You were prescribed cetirizine for cough If you have questions or concerns, call the office at [phone]

## 2015-10-16 ENCOUNTER — Ambulatory Visit: Payer: Medicare Other | Admitting: Family Medicine

## 2016-03-18 ENCOUNTER — Encounter: Payer: Self-pay | Admitting: Cardiovascular Disease

## 2016-04-02 ENCOUNTER — Ambulatory Visit: Payer: Medicare Other | Admitting: Cardiovascular Disease

## 2016-06-26 NOTE — Progress Notes (Signed)
Patient ID: Timothy Sheppard, male   DOB: 1945-10-11, 70 y.o.   MRN: [id number] 70 y.o.  F/U BP and chest pain    04/20/16 Initially Rx for bronchitis with antibiotics  Seen at the urgent care as above on 9/30 he was placed on naproxen for this arm pain which he has continued to have. He was seen in the ED and EKG showed NSR with no acute ischemic changes, troponin neg, and CXR neg. He reports that his father had MIs-1st one in his 6s, and his mother had MI age 37.He is admitted for further eval and management.   Pt's BP was initially markedly elevated>>188/103 in the ED, he states he has been compliant with his meds since 9/30- wen they were refilled at urgent care(prior to to that he ahd been out of meds for 'a long while'.  Since d/c compliant with ACE No chest pain or palpitations Always been big Use to power lift.    Echo in hospital reviewed 10/6 /14 Study Conclusions  - Left ventricle: The cavity size was normal. Wall thickness was increased in a pattern of moderate LVH. Systolic function was normal. The estimated ejection fraction was in the range of 50% to 55%. Wall motion was normal; there were no regional wall motion abnormalities. - Mitral valve: Calcified annulus. Mildly thickened leaflets . Mild regurgitation. - Left atrium: The atrium was mildly dilated.   05/25/15 saw primary and complained of more left sided chest pain and dyspnea Given PRN SL nitro   07/19/15  Myovue normal with no ischemia EF 51%   Tried to change him to Hyzaar last visit but too expensive so he went back on lisinopril  Has done a good job losing weight   ROS: Denies fever, malais, weight loss, blurry vision, decreased visual acuity, cough, sputum, SOB, hemoptysis, pleuritic pain, palpitaitons, heartburn, abdominal pain, melena, lower extremity edema, claudication, or rash.  All other systems reviewed and negative  General: Affect appropriate Healthy:  appears stated age 70: normal Neck supple  with no adenopathy JVP normal no bruits no thyromegaly Lungs clear with no wheezing and good diaphragmatic motion Heart:  S1/S2 no murmur, no rub, gallop or click PMI normal Abdomen: benighn, BS positve, no tenderness, no AAA no bruit.  No HSM or HJR Distal pulses intact with no bruits No edema Neuro non-focal Skin warm and dry No muscular weakness   Current Outpatient Prescriptions  Medication Sig Dispense Refill  . aspirin 81 MG tablet Take 81 mg by mouth daily.    . cetirizine (ZYRTEC) 10 MG tablet Take 1 tablet (10 mg total) by mouth daily. 30 tablet 11  . hydrochlorothiazide (MICROZIDE) 12.5 MG capsule Take 1 capsule (12.5 mg total) by mouth daily. 90 capsule 3  . lisinopril (PRINIVIL,ZESTRIL) 20 MG tablet Take 1 tablet (20 mg total) by mouth daily. 90 tablet 3  . metoprolol succinate (TOPROL-XL) 50 MG 24 hr tablet TAKE ONE TABLET BY MOUTH ONCE DAILY TAKE  WITH  OR  IMMEDIATELY  FOLLOWING  A  MEAL 30 tablet 11  . nitroGLYCERIN (NITROSTAT) 0.4 MG SL tablet Place 0.4 mg under the tongue every 5 (five) minutes as needed for chest pain (3 doses max).     No current facility-administered medications for this visit.     Allergies  Patient has no known allergies.  Electrocardiogram:  04/26/13  SR voltage criteria for LVH  Today SR rate 63 normal 06/28/15  SB rate 53 T wave inversion 3,F 07/01/16 SR artifact mild  lateral T wave changes   Assessment and Plan  HTN: Back on lisinopril could not afford Hyzaar add HCTZ BMET in 6 weeks   Chest Pain: resolved myovue normal observe  Prostate f/u primary PSA  Can consider floxmax per primary in future   Lab Results  Component Value Date   PSA 0.30 07/19/2013     F/u 6 months    Jenkins Rouge

## 2016-07-01 ENCOUNTER — Ambulatory Visit (INDEPENDENT_AMBULATORY_CARE_PROVIDER_SITE_OTHER): Payer: Medicare Other | Admitting: Cardiovascular Disease

## 2016-07-01 ENCOUNTER — Encounter: Payer: Self-pay | Admitting: Cardiovascular Disease

## 2016-07-01 ENCOUNTER — Encounter (INDEPENDENT_AMBULATORY_CARE_PROVIDER_SITE_OTHER): Payer: Self-pay

## 2016-07-01 VITALS — BP 140/82 | HR 66 | Ht 74.0 in | Wt 284.0 lb

## 2016-07-01 DIAGNOSIS — Z79899 Other long term (current) drug therapy: Secondary | ICD-10-CM | POA: Diagnosis not present

## 2016-07-01 DIAGNOSIS — I10 Essential (primary) hypertension: Secondary | ICD-10-CM

## 2016-07-01 MED ORDER — LISINOPRIL 20 MG PO TABS: 20.0000 mg | ORAL_TABLET | Freq: Every day | ORAL | 3 refills | Status: DC

## 2016-07-01 MED ORDER — LOSARTAN POTASSIUM-HCTZ 50-12.5 MG PO TABS: 1.0000 | ORAL_TABLET | Freq: Every day | ORAL | 3 refills | Status: DC

## 2016-07-01 MED ORDER — HYDROCHLOROTHIAZIDE 12.5 MG PO CAPS: 12.5000 mg | ORAL_CAPSULE | Freq: Every day | ORAL | 3 refills | Status: DC

## 2016-07-01 NOTE — Patient Instructions (Addendum)
Medication Instructions:  Your physician has recommended you make the following change in your medication:  1-TAKE Lisinopril 20 mg by mouth daily 2-START Hydrochlorothiazide 12.5 mg by mouth daily  Labwork: Your physician recommends that you return for lab work in: 6 weeks for a BMET.  Testing/Procedures: NONE  Follow-Up: Your physician wants you to follow-up in: 6 months with Dr. Johnsie Cancel. You will receive a reminder letter in the mail two months in advance. If you don't receive a letter, please call our office to schedule the follow-up appointment.   If you need a refill on your cardiac medications before your next appointment, please call your pharmacy.

## 2016-07-11 ENCOUNTER — Other Ambulatory Visit: Payer: Self-pay | Admitting: Cardiovascular Disease

## 2016-08-12 ENCOUNTER — Other Ambulatory Visit: Payer: Medicare Other | Admitting: *Deleted

## 2016-08-12 DIAGNOSIS — R002 Palpitations: Secondary | ICD-10-CM

## 2016-08-12 DIAGNOSIS — R739 Hyperglycemia, unspecified: Secondary | ICD-10-CM

## 2016-08-12 DIAGNOSIS — I10 Essential (primary) hypertension: Secondary | ICD-10-CM

## 2016-08-12 NOTE — Addendum Note (Signed)
Addended by: Eulis Foster on: 08/12/2016 07:32 AM   Modules accepted: Orders

## 2016-08-13 LAB — BASIC METABOLIC PANEL
BUN/Creatinine Ratio: 13 (ref 10–24)
BUN: 14 mg/dL (ref 8–27)
CALCIUM: 9.1 mg/dL (ref 8.6–10.2)
CHLORIDE: 101 mmol/L (ref 96–106)
CO2: 24 mmol/L (ref 18–29)
CREATININE: 1.08 mg/dL (ref 0.76–1.27)
GFR calc Af Amer: 81 mL/min/{1.73_m2} (ref >59)
GFR calc non Af Amer: 70 mL/min/{1.73_m2} (ref >59)
GLUCOSE: 106 mg/dL — AB (ref 65–99)
Potassium: 4.3 mmol/L (ref 3.5–5.2)
Sodium: 138 mmol/L (ref 134–144)

## 2016-08-14 ENCOUNTER — Telehealth: Payer: Self-pay | Admitting: Cardiovascular Disease

## 2016-08-14 NOTE — Telephone Encounter (Signed)
Called patient with lab results.  

## 2016-08-14 NOTE — Telephone Encounter (Signed)
Timothy Sheppard is returning your call . Thanks

## 2017-03-25 ENCOUNTER — Telehealth: Payer: Self-pay | Admitting: Cardiovascular Disease

## 2017-03-25 NOTE — Telephone Encounter (Signed)
New Message  Pt call requesting to speak with RN about getting a sooner appt with only Dr. Johnsie Cancel. Pt states he feels like his body is shutting down. He states she does not have the same energy, he states he has been experiencing some chest tightness and some SOB. SOb is not current. Please call back to discuss

## 2017-03-25 NOTE — Telephone Encounter (Signed)
Tried to call patient back. Called patient's number, and there was no answer and no voicemail to leave message.

## 2017-03-26 NOTE — Telephone Encounter (Signed)
Tried to cal the number provided. Phone rang busy.

## 2017-03-26 NOTE — Telephone Encounter (Signed)
F/u Message  Pt wife returning RN call .please call back to discuss

## 2017-04-03 NOTE — Telephone Encounter (Signed)
Patient has OV today

## 2017-04-07 ENCOUNTER — Ambulatory Visit (INDEPENDENT_AMBULATORY_CARE_PROVIDER_SITE_OTHER): Payer: Medicare Other | Admitting: Nurse Practitioner

## 2017-04-07 ENCOUNTER — Encounter (INDEPENDENT_AMBULATORY_CARE_PROVIDER_SITE_OTHER): Payer: Self-pay

## 2017-04-07 ENCOUNTER — Encounter: Payer: Self-pay | Admitting: Nurse Practitioner

## 2017-04-07 VITALS — BP 140/62 | HR 56 | Ht 74.0 in | Wt 262.8 lb

## 2017-04-07 DIAGNOSIS — R634 Abnormal weight loss: Secondary | ICD-10-CM

## 2017-04-07 DIAGNOSIS — I10 Essential (primary) hypertension: Secondary | ICD-10-CM

## 2017-04-07 LAB — BASIC METABOLIC PANEL
BUN/Creatinine Ratio: 13 (ref 10–24)
BUN: 14 mg/dL (ref 8–27)
CO2: 23 mmol/L (ref 20–29)
Calcium: 8.9 mg/dL (ref 8.6–10.2)
Chloride: 100 mmol/L (ref 96–106)
Creatinine, Ser: 1.06 mg/dL (ref 0.76–1.27)
GFR calc Af Amer: 82 mL/min/{1.73_m2} (ref >59)
GFR calc non Af Amer: 71 mL/min/{1.73_m2} (ref >59)
Glucose: 98 mg/dL (ref 65–99)
Potassium: 4.3 mmol/L (ref 3.5–5.2)
Sodium: 136 mmol/L (ref 134–144)

## 2017-04-07 LAB — CBC
Hematocrit: 28.5 % — ABNORMAL LOW (ref 37.5–51.0)
Hemoglobin: 8.6 g/dL — ABNORMAL LOW (ref 13.0–17.7)
MCH: 20.6 pg — ABNORMAL LOW (ref 26.6–33.0)
MCHC: 30.2 g/dL — ABNORMAL LOW (ref 31.5–35.7)
MCV: 68 fL — ABNORMAL LOW (ref 79–97)
Platelets: 331 10*3/uL (ref 150–379)
RBC: 4.18 x10E6/uL (ref 4.14–5.80)
RDW: 15.9 % — ABNORMAL HIGH (ref 12.3–15.4)
WBC: 7.2 10*3/uL (ref 3.4–10.8)

## 2017-04-07 LAB — TSH: TSH: 1.87 u[IU]/mL (ref 0.450–4.500)

## 2017-04-07 LAB — HEPATIC FUNCTION PANEL
ALT: 5 IU/L (ref 0–44)
AST: 10 IU/L (ref 0–40)
Albumin: 4 g/dL (ref 3.6–4.8)
Alkaline Phosphatase: 48 IU/L (ref 39–117)
Bilirubin Total: 0.5 mg/dL (ref 0.0–1.2)
Bilirubin, Direct: 0.17 mg/dL (ref 0.00–0.40)
Total Protein: 6.8 g/dL (ref 6.0–8.5)

## 2017-04-07 LAB — LIPID PANEL
Chol/HDL Ratio: 2.2 ratio (ref 0.0–5.0)
Cholesterol, Total: 131 mg/dL (ref 100–199)
HDL: 59 mg/dL (ref >39)
LDL Calculated: 61 mg/dL (ref 0–99)
Triglycerides: 53 mg/dL (ref 0–149)
VLDL Cholesterol Cal: 11 mg/dL (ref 5–40)

## 2017-04-07 MED ORDER — NITROGLYCERIN 0.4 MG SL SUBL: 0.4000 mg | SUBLINGUAL_TABLET | SUBLINGUAL | 6 refills | Status: DC | PRN

## 2017-04-07 NOTE — Progress Notes (Signed)
CARDIOLOGY OFFICE NOTE  Date:  04/07/2017    Timothy Sheppard Date of Birth: [birth year] Medical Record #[id number]  PCP:  Patient, No Pcp Per  Cardiologist:  Timothy Sheppard    Chief Complaint  Patient presents with  . Hypertension    Work in visit - seen for Dr. Johnsie Sheppard.     History of Present Illness: Timothy Sheppard is a 71 y.o. male who presents today for a work in visit. Seen for Dr. Johnsie Sheppard.   He has a history of HTN and prior atypical chest pain. Normal Myoview from December of 2015. Remote echo from 2014 with EF of 50 to 55%.   Has not been seen since December of 2015.   Phone call earlier this month - "Pt call requesting to speak with RN about getting a sooner appt with only Dr. Johnsie Sheppard. Pt states he feels like his body is shutting down. He states she does not have the same energy, he states he has been experiencing some chest tightness and some SOB. SOB is not current. Please call back to discuss." Thus added to my schedule for today.   Comes in today. Here with his wife. Notes that he "now" feels better - he reports that "a while back" he cut his metoprolol in half and stopped the HCTZ altogether. Over the past few weeks - felt "give out with no energy and then got short of breath". Just started back on his medicines over the past 2 weeks and now "feels good". Says he is "a lot better". Energy is now ok and he is not short of breath. No chest pain reported. He does bruise easily. BP is lower at home - he is back checking his BP since he has restarted his medicines. He has stopped soda - does not quantify but says "too much". Craves ice. Noted significant weight loss. Typically followed by Urgent Care for his primary care needs. No recent labs noted.   Past Medical History:  Diagnosis Date  . CHF (congestive heart failure) ([place])   . Hypertension     Past Surgical History:  Procedure Laterality Date  . BACK SURGERY  2000  . HERNIA REPAIR    . ORIF FEMUR FRACTURE Right 1967     rod insertion  . TONSILLECTOMY AND ADENOIDECTOMY       Medications: Current Meds  Medication Sig  . aspirin 81 MG tablet Take 81 mg by mouth daily.  . hydrochlorothiazide (MICROZIDE) 12.5 MG capsule Take 1 capsule (12.5 mg total) by mouth daily.  Marland Kitchen lisinopril (PRINIVIL,ZESTRIL) 20 MG tablet Take 1 tablet (20 mg total) by mouth daily.  . metoprolol succinate (TOPROL-XL) 50 MG 24 hr tablet TAKE ONE TABLET BY MOUTH ONCE DAILY WITH OR  IMMEDIATELY  FOLLOWING  A  MEAL  . nitroGLYCERIN (NITROSTAT) 0.4 MG SL tablet Place 1 tablet (0.4 mg total) under the tongue every 5 (five) minutes as needed for chest pain (3 doses max).  . [DISCONTINUED] nitroGLYCERIN (NITROSTAT) 0.4 MG SL tablet Place 0.4 mg under the tongue every 5 (five) minutes as needed for chest pain (3 doses max).     Allergies: No Known Allergies  Social History: The patient  reports that he has quit smoking. His smoking use included Pipe and Cigars. He has never used smokeless tobacco. He reports that he does not drink alcohol or use drugs.   Family History: The patient's family history includes Heart failure in his father and mother; Lupus in his mother.  Review of Systems: Please see the history of present illness.   Otherwise, the review of systems is positive for none.   All other systems are reviewed and negative.   Physical Exam: VS:  BP 140/62 (BP Location: Left Arm, Patient Position: Sitting, Cuff Size: Large)   Pulse (!) 56   Ht 6\' 2"  (1.88 m)   Wt 262 lb 12.8 oz (119.2 kg)   BMI 33.74 kg/m  .  BMI Body mass index is 33.74 kg/m.  Wt Readings from Last 3 Encounters:  04/07/17 262 lb 12.8 oz (119.2 kg)  07/01/16 284 lb (128.8 kg)  08/25/15 294 lb 12.8 oz (133.7 kg)   BP recheck by me is 110/70.  General: Pleasant. He is alert and in no acute distress.  He looks pale to me.  HEENT: Normal.  Neck: Supple, no JVD, carotid bruits, or masses noted.  Cardiac: Regular rate and rhythm. No murmurs, rubs, or  gallops. No edema.  Respiratory:  Lungs are clear to auscultation bilaterally with normal work of breathing.  GI: Soft and nontender.  MS: No deformity or atrophy. Gait and ROM intact.  Skin: Warm and dry. Color is sallow.  Neuro:  Strength and sensation are intact and no gross focal deficits noted.  Psych: Alert, appropriate and with normal affect.   LABORATORY DATA:  EKG:  EKG is ordered today. This demonstrates sinus bradycardia.  Lab Results  Component Value Date   WBC 6.7 04/25/2013   HGB 14.2 04/25/2013   HCT 41.4 04/25/2013   PLT 217 04/25/2013   GLUCOSE 106 (H) 08/12/2016   CHOL 114 04/26/2013   TRIG 73 04/26/2013   HDL 29 (L) 04/26/2013   LDLCALC 70 04/26/2013   ALT <8 07/19/2013   AST 12 07/19/2013   NA 138 08/12/2016   K 4.3 08/12/2016   CL 101 08/12/2016   CREATININE 1.08 08/12/2016   BUN 14 08/12/2016   CO2 24 08/12/2016   PSA 0.30 07/19/2013   HGBA1C 5.6 07/19/2013     BNP (last 3 results) No results for input(s): BNP in the last 8760 hours.  ProBNP (last 3 results) No results for input(s): PROBNP in the last 8760 hours.   Other Studies Reviewed Today:  Myoview Impression 2015 Exercise Capacity:  Lexiscan with low level exercise. BP Response:  Normal blood pressure response. Clinical Symptoms:  No significant symptoms noted. ECG Impression:  No significant ST segment change suggestive of ischemia. Comparison with Prior Nuclear Study: No images to compare.  By report, the EF has improved since the previous study in 2001.   Overall Impression:  Normal stress nuclear study.  LV Ejection Fraction: 51%.  LV Wall Motion:  NL LV Function; NL Wall Motion .   Thayer Headings, Brooke Bonito., MD, [facility] 07/18/2014, 5:01 PM [address],  [place] Pager [phone]  Echo Study Conclusions 2014  - Left ventricle: The cavity size was normal. Wall thickness was increased in a pattern of moderate LVH. Systolic function  was normal. The estimated ejection fraction was in the range of 50% to 55%. Wall motion was normal; there were no regional wall motion abnormalities. - Mitral valve: Calcified annulus. Mildly thickened leaflets . Mild regurgitation. - Left atrium: The atrium was mildly dilated.   Assessment/Plan: 1. HTN - BP is fine now - recheck by me is ok. He is back on his medicines. No symptoms noted.   2. Atypical chest pain - has not recurred  3. Weight  loss - ?intentional - craving ice - rechecking labs today.    4. HLD - lab today - not on therapy.   5. Prior fatigue/dyspnea - resolved with resuming his medicines - hold on further testing for now - re-evaluate on return visit in November.   Current medicines are reviewed with the patient today.  The patient does not have concerns regarding medicines other than what has been noted above.  The following changes have been made:  See above.  Labs/ tests ordered today include:    Orders Placed This Encounter  Procedures  . Basic metabolic panel  . CBC  . Hepatic function panel  . Lipid panel  . TSH  . EKG 12-Lead     Disposition:   FU with Dr. Johnsie Sheppard in November as planned.   Patient is agreeable to this plan and will call if any problems develop in the interim.   SignedTruitt Merle, NP  04/07/2017 8:30 AM  [facility] [address] [place], [place]  [address] Phone: [phone] Fax: [phone]

## 2017-04-07 NOTE — Patient Instructions (Addendum)
We will be checking the following labs today - BMET, CBC, HPF, Lipids and TSH  Medication Instructions:    Continue with your current medicines.   I refilled your medicines.     Testing/Procedures To Be Arranged:  N/A  Follow-Up:   See Dr. Johnsie Cancel in November as planned.     Other Special Instructions:   N/A    If you need a refill on your cardiac medications before your next appointment, please call your pharmacy.   Call the [place] office at [phone] if you have any questions, problems or concerns.

## 2017-04-08 ENCOUNTER — Ambulatory Visit (INDEPENDENT_AMBULATORY_CARE_PROVIDER_SITE_OTHER): Payer: Medicare Other | Admitting: Family Medicine

## 2017-04-08 ENCOUNTER — Encounter: Payer: Self-pay | Admitting: Family Medicine

## 2017-04-08 VITALS — BP 122/68 | HR 70 | Temp 98.0°F | Ht 74.0 in | Wt 261.0 lb

## 2017-04-08 DIAGNOSIS — D509 Iron deficiency anemia, unspecified: Secondary | ICD-10-CM

## 2017-04-08 LAB — POCT HEMOGLOBIN: Hemoglobin: 8.6 g/dL — AB (ref 14.1–18.1)

## 2017-04-08 NOTE — Progress Notes (Signed)
Subjective:     Patient ID: Timothy Sheppard, male   DOB: 13-Feb-1946, 71 y.o.   MRN: [id number]  Anemia  Presents for initial visit. Onset time: Unknown. he was seen at his Cards office yesterday for fatigue and cardiac issue. CBC checked showed dropped in hemoglobin, hence he was advised follow-up with his PCP. Symptoms include light-headedness, malaise/fatigue and weight loss. There has been no abdominal pain, anorexia, fever or palpitations. (Endorsed fatigue in the last 1-2 months. No fainting or LOC,occasional SOB) Signs of blood loss that are not present include hematemesis, hematochezia and melena. Past treatments include nothing. There is no history of cancer. (He stated he had colon cancer screen in 2012) Procedure history includes colonoscopy.    Current Outpatient Prescriptions on File Prior to Visit  Medication Sig Dispense Refill  . aspirin 81 MG tablet Take 81 mg by mouth daily.    . hydrochlorothiazide (MICROZIDE) 12.5 MG capsule Take 1 capsule (12.5 mg total) by mouth daily. 90 capsule 3  . lisinopril (PRINIVIL,ZESTRIL) 20 MG tablet Take 1 tablet (20 mg total) by mouth daily. 90 tablet 3  . metoprolol succinate (TOPROL-XL) 50 MG 24 hr tablet TAKE ONE TABLET BY MOUTH ONCE DAILY WITH OR  IMMEDIATELY  FOLLOWING  A  MEAL 90 tablet 3  . nitroGLYCERIN (NITROSTAT) 0.4 MG SL tablet Place 1 tablet (0.4 mg total) under the tongue every 5 (five) minutes as needed for chest pain (3 doses max). 25 tablet 6   No current facility-administered medications on file prior to visit.    Past Medical History:  Diagnosis Date  . CHF (congestive heart failure) ([place])   . Hypertension    Vitals:   04/08/17 1021  BP: 122/68  Pulse: 70  Temp: 98 F (36.7 C)  TempSrc: Oral  SpO2: 98%  Weight: 261 lb (118.4 kg)  Height: 6\' 2"  (1.88 m)      Review of Systems  Constitutional: Positive for malaise/fatigue and weight loss. Negative for fever.  Respiratory: Negative.   Cardiovascular: Negative.   Negative for palpitations.  Gastrointestinal: Negative.  Negative for abdominal pain, anorexia, hematemesis, hematochezia and melena.  Genitourinary: Negative.   Neurological: Positive for light-headedness.  All other systems reviewed and are negative.      Objective:   Physical Exam  Constitutional: He is oriented to person, place, and time. He appears well-developed. No distress.  HENT:  Head: Normocephalic.  Eyes:  Mild Conjunctiva pallor.  Cardiovascular: Normal rate, regular rhythm and normal heart sounds.   No murmur heard. Pulmonary/Chest: Effort normal and breath sounds normal. No respiratory distress. He has no wheezes.  Abdominal: Soft. Bowel sounds are normal. He exhibits no distension and no mass. There is no tenderness.  Musculoskeletal: Normal range of motion. He exhibits no edema.  Neurological: He is alert and oriented to person, place, and time. No cranial nerve deficit.  Skin: Skin is warm and dry.  Nursing note and vitals reviewed.      Assessment:     Microcytic Anemia: Asymptomatic and hemodynamically stable.    Plan:     I reviewed note and lab from his cardiologist's office. Unclear if acute in nature since his last hemoglobin checked was 4 years ago. ?? Blood loss vs nutrition deficiency. Last hemoglobin in 2014 was 14.2 No colonoscopy result on file although it is documented that he is up to date with colon cancer screening per [facility]. Recheck CBC with diff. Obtain anemia panel. 3 stool card given for FOBT. Instruction for  use given and he is advised to return today or ASAP with his stool sample for testing. If positive, I will refer him again to GI for reassessment for colon cancer screening. In the mean time start Ferrous sulfate 1 tab BID. Return precaution discussed. Advised ED visit if his dizziness or fatigue worsens. He verbalized understanding. F/U with PCP soon for other chronic problems and health maintenance.  More than 50% of this 25 min  face to face encounter was spent on evaluation, counseling and coordination of care.

## 2017-04-08 NOTE — Patient Instructions (Signed)
It was nice seeing you today. You hemoglobin remains stable. Please start Ferrous sulfate 1 tablet twice a day. Please bring back stool card as soon as possible See PCP in 1 week.

## 2017-04-09 ENCOUNTER — Ambulatory Visit: Payer: Medicare Other | Admitting: Family Medicine

## 2017-04-09 ENCOUNTER — Telehealth: Payer: Self-pay | Admitting: Family Medicine

## 2017-04-09 LAB — CBC WITH DIFFERENTIAL/PLATELET
BASOS: 0 %
Basophils Absolute: 0 10*3/uL (ref 0.0–0.2)
EOS (ABSOLUTE): 0.1 10*3/uL (ref 0.0–0.4)
Eos: 1 %
HEMOGLOBIN: 8.5 g/dL — AB (ref 13.0–17.7)
IMMATURE GRANS (ABS): 0 10*3/uL (ref 0.0–0.1)
IMMATURE GRANULOCYTES: 0 %
Lymphocytes Absolute: 1.1 10*3/uL (ref 0.7–3.1)
Lymphs: 16 %
MCH: 20.4 pg — ABNORMAL LOW (ref 26.6–33.0)
MCHC: 30.2 g/dL — ABNORMAL LOW (ref 31.5–35.7)
MCV: 68 fL — AB (ref 79–97)
MONOCYTES: 8 %
Monocytes Absolute: 0.5 10*3/uL (ref 0.1–0.9)
NEUTROS PCT: 75 %
Neutrophils Absolute: 5 10*3/uL (ref 1.4–7.0)
PLATELETS: 328 10*3/uL (ref 150–379)
RBC: 4.16 x10E6/uL (ref 4.14–5.80)
RDW: 16.3 % — AB (ref 12.3–15.4)
WBC: 6.8 10*3/uL (ref 3.4–10.8)

## 2017-04-09 LAB — ANEMIA PANEL
FERRITIN: 8 ng/mL — AB (ref 30–400)
FOLATE, RBC: 1151 ng/mL (ref >498)
Folate, Hemolysate: 323.4 ng/mL
Hematocrit: 28.1 % — ABNORMAL LOW (ref 37.5–51.0)
Iron Saturation: 5 % — CL (ref 15–55)
Iron: 17 ug/dL — ABNORMAL LOW (ref 38–169)
RETIC CT PCT: 1.6 % (ref 0.6–2.6)
TIBC: 323 ug/dL (ref 250–450)
UIBC: 306 ug/dL (ref 111–343)
VITAMIN B 12: 261 pg/mL (ref 232–1245)

## 2017-04-09 NOTE — Telephone Encounter (Signed)
Test result discussed with patient. He has iron deficiency anemia. I advised him to take Ferrous sulphate TID instead of BID. He will bring his stool card tomorrow to assess for GI bleed. He has f/u with his PCP next Monday for POC hemoglobin to assess for stability.  Repeat Anemia panel in about 4 weeks.

## 2017-04-10 LAB — POC HEMOCCULT BLD/STL (HOME/3-CARD/SCREEN)
Card #2 Fecal Occult Blod, POC: NEGATIVE
FECAL OCCULT BLD: NEGATIVE
FECAL OCCULT BLD: NEGATIVE

## 2017-04-10 NOTE — Addendum Note (Signed)
Addended by: [place] Pink on: 04/10/2017 10:35 AM   Modules accepted: Orders

## 2017-04-10 NOTE — Telephone Encounter (Signed)
FOBT  Result discussed with patient. No GI referral warranted at this time. I advised him to provide colonoscopy report to his PCP to review and determine if he requires GI referral for colonoscopy. He verbalized understanding. PCP to follow-up on that.

## 2017-04-14 ENCOUNTER — Ambulatory Visit (INDEPENDENT_AMBULATORY_CARE_PROVIDER_SITE_OTHER): Payer: Medicare Other | Admitting: Family Medicine

## 2017-04-14 ENCOUNTER — Encounter: Payer: Self-pay | Admitting: Family Medicine

## 2017-04-14 VITALS — BP 120/65 | HR 68 | Temp 98.2°F | Ht 72.0 in | Wt 265.6 lb

## 2017-04-14 DIAGNOSIS — D649 Anemia, unspecified: Secondary | ICD-10-CM

## 2017-04-14 LAB — POCT HEMOGLOBIN: Hemoglobin: 8.3 g/dL — AB (ref 14.1–18.1)

## 2017-04-14 NOTE — Progress Notes (Signed)
   Subjective:    Patient ID: Timothy Sheppard, male    DOB: 23-Sep-1945, 71 y.o.   MRN: [id number]   CC: Follow-up anemia  HPI: Patient is a 71 year old male who presents today for follow-up for recent anemia. Patient was seen on 9/18 here in clinic for anemia after recent visit at his cardiologist's office showed a low hemoglobin. There were no obvious source of bleeding. FOBT was negative. Patient was started on ferrous sulfate 3 times a day has been doing well with no signs of constipation or intolerance. Last hemoglobin was 8.6 on 9/18. Patient denies any dizziness or fatigue,shortness of breath, chest pain abdominal pain, nausea, vomiting. Patient did not bring records from colonoscopy, but reports that it was within normal limits with no abnormal findings.  Smoking status reviewed   ROS: all other systems were reviewed and are negative other than in the HPI   Past Medical History:  Diagnosis Date  . CHF (congestive heart failure) ([place])   . Hypertension     Past Surgical History:  Procedure Laterality Date  . BACK SURGERY  2000  . HERNIA REPAIR    . ORIF FEMUR FRACTURE Right 1967   rod insertion  . TONSILLECTOMY AND ADENOIDECTOMY      Past medical history, surgical, family, and social history reviewed and updated in the EMR as appropriate.  Objective:  BP 120/65 (BP Location: Right Arm, Patient Position: Sitting, Cuff Size: Large)   Pulse 68   Temp 98.2 F (36.8 C) (Oral)   Ht 6' (1.829 m)   Wt 265 lb 9.6 oz (120.5 kg)   SpO2 96%   BMI 36.02 kg/m   Vitals and nursing note reviewed  General: NAD, pleasant, able to participate in exam Cardiac: RRR, normal heart sounds, no murmurs. 2+ radial and PT pulses bilaterally Respiratory: CTAB, normal effort, No wheezes, rales or rhonchi Abdomen: soft, nontender, nondistended, no hepatic or splenomegaly, +BS Extremities: no edema or cyanosis. WWP. Skin: warm and dry, no rashes noted Neuro: alert and oriented x4, no focal  deficits Psych: Normal affect and mood   Assessment & Plan:   #Follow-up for anemia, stable Patient present today to follow-up on hemoglobin. Last hemoglobin on 9/18 was 8.6. Today hemoglobin is 8.3 and appears to be stable. Patient has been taking ferrous sulfate 3 times a day for the past week. FOBT was negative with no indication for further GI workup. Patient will return in 4 weeks for repeat anemia panel. In the meantime, advised patient to use MiraLAX if constipation develops. Also informed patient that stool color could change now that he is on iron therapy. Patient knows to call clinic if he has increased fatigue, weakness dizziness and other symptoms concerning for worsening anemia. Patient will try to bring GI record at next visit.   Marjie Skiff, MD [place] PGY-2

## 2017-04-14 NOTE — Patient Instructions (Signed)
It was great seeing you today! We have addressed the following issues today   1. Your hemoglobin is stable at 8.3 from 8.6. I will see you again in about a month. Continue with the iron therapy. You can use a stool softener if you become constipated. 2. Take the rest of your medications as prescribed.  If we did any lab work today, and the results require attention, either me or my nurse will get in touch with you. If everything is normal, you will get a letter in mail and a message via . If you don't hear from [place] in two weeks, please give [place] a call. Otherwise, we look forward to seeing you again at your next visit. If you have any questions or concerns before then, please call the clinic at [phone].  Please bring all your medications to every doctors visit  Sign up for My Chart to have easy access to your labs results, and communication with your Primary care physician. Please ask Front Desk for some assistance.   Please check-out at the front desk before leaving the clinic.    Take Care,   Dr. Andy Gauss

## 2017-05-05 ENCOUNTER — Ambulatory Visit (INDEPENDENT_AMBULATORY_CARE_PROVIDER_SITE_OTHER): Payer: Medicare Other | Admitting: Family Medicine

## 2017-05-05 ENCOUNTER — Encounter: Payer: Self-pay | Admitting: Family Medicine

## 2017-05-05 VITALS — BP 100/67 | HR 88 | Temp 98.3°F | Ht 72.0 in | Wt 258.0 lb

## 2017-05-05 DIAGNOSIS — D649 Anemia, unspecified: Secondary | ICD-10-CM | POA: Diagnosis not present

## 2017-05-05 DIAGNOSIS — N529 Male erectile dysfunction, unspecified: Secondary | ICD-10-CM | POA: Diagnosis not present

## 2017-05-05 LAB — POCT HEMOGLOBIN: HEMOGLOBIN: 12.5 g/dL — AB (ref 14.1–18.1)

## 2017-05-05 MED ORDER — SILDENAFIL CITRATE 100 MG PO TABS: 100.0000 mg | ORAL_TABLET | Freq: Every day | ORAL | 0 refills | Status: DC | PRN

## 2017-05-05 NOTE — Patient Instructions (Addendum)
It was great seeing you today! We have addressed the following issues today  1. Your blood pressure was a little low today, as discussed I want you to increase your water intake. 2. I refilled your medications as requested. 3. Please take 2 iron tabs a day instead of 3 like you have been doing.  If we did any lab work today, and the results require attention, either me or my nurse will get in touch with you. If everything is normal, you will get a letter in mail and a message via . If you don't hear from [place] in two weeks, please give [place] a call. Otherwise, we look forward to seeing you again at your next visit. If you have any questions or concerns before then, please call the clinic at [phone].  Please bring all your medications to every doctors visit  Sign up for My Chart to have easy access to your labs results, and communication with your Primary care physician. Please ask Front Desk for some assistance.   Please check-out at the front desk before leaving the clinic.    Take Care,   Dr. Andy Gauss \

## 2017-05-05 NOTE — Progress Notes (Signed)
   Subjective:    Patient ID: Timothy Sheppard, male    DOB: [birth year], 71 y.o.   MRN: [id number]   CC: Follow up for anemia  HPI: Patient is 71 yo male who present today to follow up on anemia. Patient was recently seen in clinic on 9/24 to for a hemoglobin check after he was diagnosed with anemia on 9/18 with concern for GI bleed but work up was negative. Patient was diagnosed with iron deficiency anemia. Patient has been taking ferrous sulfate three times a day reports that he has been tolerating well. Patient has some mild constipation, but is on a bowel regimen. Patient reports feeling much better and feel that he has more energy. He denies any chest pain, SOB, abdominal pain, nausea, vomiting.   Smoking status reviewed   ROS: all other systems were reviewed and are negative other than in the HPI   Past Medical History:  Diagnosis Date  . CHF (congestive heart failure) ([place])   . Hypertension     Past Surgical History:  Procedure Laterality Date  . BACK SURGERY  2000  . HERNIA REPAIR    . ORIF FEMUR FRACTURE Right 1967   rod insertion  . TONSILLECTOMY AND ADENOIDECTOMY      Past medical history, surgical, family, and social history reviewed and updated in the EMR as appropriate.  Objective:  BP 100/67   Pulse 88   Temp 98.3 F (36.8 C) (Oral)   Ht 6' (1.829 m)   Wt 258 lb (117 kg)   SpO2 99%   BMI 34.99 kg/m   Vitals and nursing note reviewed  General: NAD, pleasant, able to participate in exam Cardiac: RRR, normal heart sounds, no murmurs. 2+ radial and PT pulses bilaterally Respiratory: CTAB, normal effort, No wheezes, rales or rhonchi Abdomen: soft, nontender, nondistended, no hepatic or splenomegaly, +BS Extremities: no edema or cyanosis. WWP. Skin: warm and dry, no rashes noted Neuro: alert and oriented x4, no focal deficits Psych: Normal affect and mood   Assessment & Plan:   #Iron deficiency anemia, improving Patient's hemoglobin today was 12.5 up  from 8.3 at last OV. Given marked improvement in hemoglobin, improved symptoms and mild constipation will ask patient to continue iron therapy but at a decrease frequency for the next two months. Will see patient in about a month for recheck if count stable will finish three months and stop. --Ferrous sulfate bid from tid --Will see patient in a month  #Hypotension, mild Patient is mildly hypotensive today with no symptoms. Patient does endorses occasional dizziness. Likely secondary to poor fluid intake. Patient use to consume a large amount of crushed ice when anemic. Since correcting his anemia patient has decreased water intake. No history of CHF. Last echo with EF within normal limits withtout any diastolic dysfunction. --Counsel patient to increase fluid intake to aid with BP --Will follow up at next visit.  #Erectile dysfunction Patient requested refill on Viagra.It was discussed with cardiologist (Dr. Johnsie Cancel) who told him PCP should refill for him. Patient has used it in the past with no issue. --Refilled Sildenafil 100 mg as needed   Marjie Skiff, MD [place] PGY-2

## 2017-06-09 ENCOUNTER — Ambulatory Visit (INDEPENDENT_AMBULATORY_CARE_PROVIDER_SITE_OTHER): Payer: Medicare Other | Admitting: Family Medicine

## 2017-06-09 ENCOUNTER — Other Ambulatory Visit: Payer: Self-pay

## 2017-06-09 ENCOUNTER — Encounter: Payer: Self-pay | Admitting: Family Medicine

## 2017-06-09 VITALS — BP 132/70 | HR 56 | Temp 97.7°F | Ht 72.0 in | Wt 259.0 lb

## 2017-06-09 DIAGNOSIS — Z23 Encounter for immunization: Secondary | ICD-10-CM

## 2017-06-09 DIAGNOSIS — I509 Heart failure, unspecified: Secondary | ICD-10-CM | POA: Insufficient documentation

## 2017-06-09 DIAGNOSIS — G609 Hereditary and idiopathic neuropathy, unspecified: Secondary | ICD-10-CM | POA: Diagnosis not present

## 2017-06-09 DIAGNOSIS — D649 Anemia, unspecified: Secondary | ICD-10-CM

## 2017-06-09 DIAGNOSIS — I10 Essential (primary) hypertension: Secondary | ICD-10-CM | POA: Insufficient documentation

## 2017-06-09 LAB — POCT HEMOGLOBIN: HEMOGLOBIN: 14.6 g/dL (ref 14.1–18.1)

## 2017-06-09 LAB — POCT GLYCOSYLATED HEMOGLOBIN (HGB A1C): HEMOGLOBIN A1C: 5

## 2017-06-09 MED ORDER — GABAPENTIN 300 MG PO CAPS: 300.0000 mg | ORAL_CAPSULE | Freq: Three times a day (TID) | ORAL | 3 refills | Status: DC

## 2017-06-09 NOTE — Progress Notes (Signed)
Subjective:    Patient ID: Timothy Sheppard, male    DOB: Nov 01, 1945, 71 y.o.   MRN: [id number]   CC: Follow-up for anemia   HPI: Patient is a 71 year old male who presents today to discuss continuation of recent iron supplementation therapy for anemia.  Patient has been taking 250 mg ferrous sulfate  2 times since last office visit (05/05/2017). Hemoglobin then was 12.5 up from 8.3 back in 04/14/2017. Patient was then on ferrous sulfate 3 times daily.  Patient has been doing well with minimal constipation but does endorse dark stools.  Patient denies any shortness of breath, chest pain, abdominal pain, fatigue.  Decreased sensation in lower extremities bilaterally: Patient reports decreased sensation in his leg and states sometimes he can feel his feet.  Patient does not have history of diabetes, last A1c was 5.6.  Patient reports having cool feet at time, 2 wife who is accompanying patient denies patient's report about his cold feet.  Hypertension: Patient brought in a log of his blood pressure from the past few days.  Patient reports he has been mostly normotensive while off lisinopril.  Patient is wondering if he could stop his blood pressure medication lisinopril specifically.  Patient does report intermittent dizziness.  Patient is currently taking lisinopril 20 mg daily and is also on metoprolol.  Smoking status reviewed   ROS: all other systems were reviewed and are negative other than in the HPI   Past Medical History:  Diagnosis Date  . CHF (congestive heart failure) ([place])   . Hypertension     Past Surgical History:  Procedure Laterality Date  . BACK SURGERY  2000  . HERNIA REPAIR    . ORIF FEMUR FRACTURE Right 1967   rod insertion  . TONSILLECTOMY AND ADENOIDECTOMY      Past medical history, surgical, family, and social history reviewed and updated in the EMR as appropriate.  Objective:  BP 132/70   Pulse (!) 56   Temp 97.7 F (36.5 C) (Oral)   Ht 6' (1.829 m)    Wt 259 lb (117.5 kg)   SpO2 98%   BMI 35.13 kg/m   Vitals and nursing note reviewed  General: NAD, pleasant, able to participate in exam Cardiac: RRR, normal heart sounds, no murmurs. 2+ radial and PT pulses bilaterally Respiratory: CTAB, normal effort, No wheezes, rales or rhonchi Abdomen: soft, nontender, nondistended, no hepatic or splenomegaly, +BS Extremities: no edema or cyanosis. WWP.  Foot exam performed, patient with mild decreased sensation in feet left > right.  Patient has strong pulses and warm feet. Skin: warm and dry, no rashes noted Neuro: alert and oriented x4, no focal deficits Psych: Normal affect and mood   Assessment & Plan:   #Anemia follow-up Hemoglobin today is 14.6 up from 12.5 in October and 8.3 in September.  Patient was started on 325 mg ferrous sulfate back in early September.  Patient was 3 times daily ferrous sulfate until last next office visit on 05/05/2017 when he was transitioned to twice daily.  Today hemoglobin is within normal limits, will discontinue iron therapy.  Further follow hemoglobin in the next few weeks to ensure stability.  #Bilateral lower extremity decreased sensation Patient reports decreased sensation in feet worse at night with symptoms consistent with neuropathy.  Patient also describes cold feet during exam temperature was normal with good pulses.  Less concerned about PVD.  Patient does not have history of diabetes, last A1c was in 2014 it was 5.6.  Today repeat  A1c is 5.0.   --We will check patient's B12 level at next office visit --We will start patient on gabapentin 300 mg 3 times daily  #Hypertension Today BP is 132/70. patient brought in blood pressure log while he was off lisinopril showing mostly normotensive for some hypotensive events and occasional hyper tensive recordings.  Given dizziness reported by patient and no recordings reported will decrease patient's lisinopril from 20mg  to 10 mg.  Patient will continue taking  metoprolol 50 mg daily.  Health maintenance: Patient received pneumonia vaccine today.   Marjie Skiff, MD [place] PGY-2

## 2017-06-09 NOTE — Patient Instructions (Addendum)
It was great seeing you today! We have addressed the following issues today  1. Your Hemoglobin is 14.6 which is normal. You can stop taking the iron. 2. You can cut down on your BP medications to from 20 mg to 10 mg 3. I will start you on Gabapentin a medications for neuropathy. I want you to take 3 pills a day. If you experience any dizziness you should stop it .  4. I will see you in about a month to follow up on your neuropathy.  If we did any lab work today, and the results require attention, either me or my nurse will get in touch with you. If everything is normal, you will get a letter in mail and a message via . If you don't hear from [place] in two weeks, please give [place] a call. Otherwise, we look forward to seeing you again at your next visit. If you have any questions or concerns before then, please call the clinic at [phone].  Please bring all your medications to every doctors visit  Sign up for My Chart to have easy access to your labs results, and communication with your Primary care physician. Please ask Front Desk for some assistance.   Please check-out at the front desk before leaving the clinic.    Take Care,   Dr. Andy Gauss

## 2017-06-17 NOTE — Progress Notes (Signed)
CARDIOLOGY OFFICE NOTE  Date:  06/20/2017    Timothy Sheppard Date of Birth: Sep 14, 1945 Medical Record #[id number]  PCP:  Marjie Skiff, MD  Cardiologist:  Johnsie Cancel    No chief complaint on file.   History of Present Illness: Timothy Sheppard is a 71 y.o. male who presents for f/u HTN and atypical chest pain   Normal Myoview December of 2015. Remote echo from 2014 with EF of 50 to 55%.   I have not seen him since 2015 Seen by NP September 2018   Pt states he feels like his body is shutting down. He states she does not have the same energy, he states he has been experiencing some chest tightness and some SOB. Noted stopping his beta blocker and diuretic and felt better after restarting   Craves ice. Noted significant weight loss. Sees [facility]  No cardiac w/u done had significant anemia with Hct 26.5 Started on iron with improvement Started on gabapentin for neuropathy and lisinopril decreased to 10 mg /daily Feeling great since anemia resolved   Past Medical History:  Diagnosis Date  . CHF (congestive heart failure) ([place])   . Hypertension     Past Surgical History:  Procedure Laterality Date  . BACK SURGERY  2000  . HERNIA REPAIR    . ORIF FEMUR FRACTURE Right 1967   rod insertion  . TONSILLECTOMY AND ADENOIDECTOMY       Medications: Current Meds  Medication Sig  . aspirin 81 MG tablet Take 81 mg by mouth daily.  Marland Kitchen gabapentin (NEURONTIN) 300 MG capsule Take 1 capsule (300 mg total) 3 (three) times daily by mouth.  . metoprolol succinate (TOPROL-XL) 50 MG 24 hr tablet Take 1 tablet (50 mg total) by mouth daily. Take with or immediately following a meal.  . nitroGLYCERIN (NITROSTAT) 0.4 MG SL tablet Place 1 tablet (0.4 mg total) under the tongue every 5 (five) minutes as needed for chest pain (3 doses max).  . sildenafil (VIAGRA) 100 MG tablet Take 1 tablet (100 mg total) by mouth daily as needed for erectile dysfunction.  . [DISCONTINUED]  metoprolol succinate (TOPROL-XL) 50 MG 24 hr tablet TAKE ONE TABLET BY MOUTH ONCE DAILY WITH OR  IMMEDIATELY  FOLLOWING  A  MEAL     Allergies: No Known Allergies  Social History: The patient  reports that he has quit smoking. His smoking use included pipe and cigars. he has never used smokeless tobacco. He reports that he does not drink alcohol or use drugs.   Family History: The patient's family history includes Heart failure in his father and mother; Lupus in his mother.   Review of Systems: Please see the history of present illness.   Otherwise, the review of systems is positive for none.   All other systems are reviewed and negative.   Physical Exam: VS:  BP 104/70   Pulse (!) 58   Ht 6' (1.829 m)   Wt 265 lb 6.4 oz (120.4 kg)   SpO2 95%   BMI 35.99 kg/m  .  BMI Body mass index is 35.99 kg/m.  Wt Readings from Last 3 Encounters:  06/20/17 265 lb 6.4 oz (120.4 kg)  06/09/17 259 lb (117.5 kg)  05/05/17 258 lb (117 kg)   Affect appropriate Healthy:  appears stated age 64: normal Neck supple with no adenopathy JVP normal no bruits no thyromegaly Lungs clear with no wheezing and good diaphragmatic motion Heart:  S1/S2 no murmur, no rub, gallop  or click PMI normal Abdomen: benighn, BS positve, no tenderness, no AAA no bruit.  No HSM or HJR Distal pulses intact with no bruits No edema Neuro non-focal Skin warm and dry No muscular weakness    LABORATORY DATA:  EKG:  EKG is ordered today. This demonstrates sinus bradycardia.  Lab Results  Component Value Date   WBC 6.8 04/08/2017   HGB 14.6 06/09/2017   HCT 28.1 (L) 04/08/2017   PLT 328 04/08/2017   GLUCOSE 98 04/07/2017   CHOL 131 04/07/2017   TRIG 53 04/07/2017   HDL 59 04/07/2017   LDLCALC 61 04/07/2017   ALT 5 04/07/2017   AST 10 04/07/2017   NA 136 04/07/2017   K 4.3 04/07/2017   CL 100 04/07/2017   CREATININE 1.06 04/07/2017   BUN 14 04/07/2017   CO2 23 04/07/2017   TSH 1.870 04/07/2017    PSA 0.30 07/19/2013   HGBA1C 5.0 06/09/2017     BNP (last 3 results) No results for input(s): BNP in the last 8760 hours.  ProBNP (last 3 results) No results for input(s): PROBNP in the last 8760 hours.   Other Studies Reviewed Today:  Myoview Impression 2015 Exercise Capacity:  Lexiscan with low level exercise. BP Response:  Normal blood pressure response. Clinical Symptoms:  No significant symptoms noted. ECG Impression:  No significant ST segment change suggestive of ischemia. Comparison with Prior Nuclear Study: No images to compare.  By report, the EF has improved since the previous study in 2001.   Overall Impression:  Normal stress nuclear study.  LV Ejection Fraction: 51%.  LV Wall Motion:  NL LV Function; NL Wall Motion .   Thayer Headings, Brooke Bonito., MD, [facility] 07/18/2014, 5:01 PM [address]  [place] Pager [phone]  Echo Study Conclusions 2014  - Left ventricle: The cavity size was normal. Wall thickness was increased in a pattern of moderate LVH. Systolic function was normal. The estimated ejection fraction was in the range of 50% to 55%. Wall motion was normal; there were no regional wall motion abnormalities. - Mitral valve: Calcified annulus. Mildly thickened leaflets . Mild regurgitation. - Left atrium: The atrium was mildly dilated.   Assessment/Plan: 1. HTN - Well controlled.  Continue current medications and low sodium Dash type diet.    2. Atypical chest pain - has not recurred  3. Anemia:  Improved with FeSo4 not referred to GI for colonoscopy by primary   4. HLD -  At goal  Lab Results  Component Value Date   [facility] 61 04/07/2017     Jenkins Rouge

## 2017-06-20 ENCOUNTER — Encounter: Payer: Self-pay | Admitting: Cardiovascular Disease

## 2017-06-20 ENCOUNTER — Ambulatory Visit (INDEPENDENT_AMBULATORY_CARE_PROVIDER_SITE_OTHER): Payer: Medicare Other | Admitting: Cardiovascular Disease

## 2017-06-20 VITALS — BP 104/70 | HR 58 | Ht 72.0 in | Wt 265.4 lb

## 2017-06-20 DIAGNOSIS — I10 Essential (primary) hypertension: Secondary | ICD-10-CM

## 2017-06-20 MED ORDER — LISINOPRIL 20 MG PO TABS: 20.0000 mg | ORAL_TABLET | Freq: Every day | ORAL | 3 refills | Status: DC

## 2017-06-20 MED ORDER — HYDROCHLOROTHIAZIDE 12.5 MG PO CAPS: 12.5000 mg | ORAL_CAPSULE | Freq: Every day | ORAL | 3 refills | Status: DC

## 2017-06-20 MED ORDER — METOPROLOL SUCCINATE ER 50 MG PO TB24: 50.0000 mg | ORAL_TABLET | Freq: Every day | ORAL | 3 refills | Status: DC

## 2017-06-20 NOTE — Patient Instructions (Addendum)
Medication Instructions:  Your physician recommends that you continue on your current medications as directed. Please refer to the Current Medication list given to you today.  Labwork: NONE  Testing/Procedures: NONE  Follow-Up: Your physician wants you to follow-up as needed with  Dr. Nishan.    If you need a refill on your cardiac medications before your next appointment, please call your pharmacy.    

## 2017-07-18 ENCOUNTER — Other Ambulatory Visit: Payer: Self-pay

## 2017-07-18 ENCOUNTER — Encounter: Payer: Self-pay | Admitting: Family Medicine

## 2017-07-18 ENCOUNTER — Ambulatory Visit (INDEPENDENT_AMBULATORY_CARE_PROVIDER_SITE_OTHER): Payer: Medicare Other | Admitting: Family Medicine

## 2017-07-18 VITALS — BP 128/64 | HR 99 | Temp 98.2°F | Ht 72.0 in | Wt 264.0 lb

## 2017-07-18 DIAGNOSIS — B9789 Other viral agents as the cause of diseases classified elsewhere: Secondary | ICD-10-CM

## 2017-07-18 DIAGNOSIS — J069 Acute upper respiratory infection, unspecified: Secondary | ICD-10-CM

## 2017-07-18 DIAGNOSIS — D509 Iron deficiency anemia, unspecified: Secondary | ICD-10-CM | POA: Diagnosis not present

## 2017-07-18 DIAGNOSIS — G5793 Unspecified mononeuropathy of bilateral lower limbs: Secondary | ICD-10-CM

## 2017-07-18 NOTE — Patient Instructions (Addendum)
It was great seeing you today! We have addressed the following issues today  1. Increase your gabapentin at night and take one extra pill. 2. Continue to monitor your symptoms, if you have fever and chills and feel worse, please let me know. 3. Continue with Mucinex, stay well hydrated 4. You can get Delsym a cough syrup at the store.  If we did any lab work today, and the results require attention, either me or my nurse will get in touch with you. If everything is normal, you will get a letter in mail and a message via . If you don't hear from [place] in two weeks, please give [place] a call. Otherwise, we look forward to seeing you again at your next visit. If you have any questions or concerns before then, please call the clinic at [phone].  Please bring all your medications to every doctors visit  Sign up for My Chart to have easy access to your labs results, and communication with your Primary care physician. Please ask Front Desk for some assistance.   Please check-out at the front desk before leaving the clinic.    Take Care,   Dr. Andy Gauss

## 2017-07-18 NOTE — Progress Notes (Signed)
   Subjective:    Patient ID: Timothy Sheppard, male    DOB: 01/10/1946, 71 y.o.   MRN: [id number]   CC: Follow up for LE neuropathy and cough/ congestion symptoms  HPI: Patient is a 71 yo male who presents today to follow up on bilateral lower extremities neuropathy. Patient was started on gabapentin 300 mg tid. Patient reports improvement in tingling and burning sensation. Patient states he is still bother a bit at night but overall symptoms are much improved. Patient denies any dizziness or sleepiness.   Cold symptoms: Patient reports cough, rhinorrhea and congestion for the past three weeks. Patient has been using Mucinex which he reports has helped. Patient was given an antibiotic by his daughter but he cannot remember the name. Patient states that symptoms have improved in the past few days. He denies any fevers or chills.   Smoking status reviewed   ROS: all other systems were reviewed and are negative other than in the HPI   Past Medical History:  Diagnosis Date  . CHF (congestive heart failure) ([place])   . Hypertension     Past Surgical History:  Procedure Laterality Date  . BACK SURGERY  2000  . HERNIA REPAIR    . ORIF FEMUR FRACTURE Right 1967   rod insertion  . TONSILLECTOMY AND ADENOIDECTOMY      Past medical history, surgical, family, and social history reviewed and updated in the EMR as appropriate.  Objective:  BP 128/64   Pulse 99   Temp 98.2 F (36.8 C) (Oral)   Ht 6' (1.829 m)   Wt 264 lb (119.7 kg)   SpO2 97%   BMI 35.80 kg/m   Vitals and nursing note reviewed  General: NAD, pleasant, able to participate in exam Cardiac: RRR, normal heart sounds, no murmurs. 2+ radial and PT pulses bilaterally Respiratory: Bilateral upper airway noises transmitted, no wheezing or crackles noted on exam.  No increase work of breathing. Abdomen: soft, nontender, nondistended, no hepatic or splenomegaly, +BS Extremities: no edema or cyanosis. WWP. Skin: warm and dry,  no rashes noted Neuro: alert and oriented x4, no focal deficits Psych: Normal affect and mood   Assessment & Plan:   # Peripheral neuropathy, chronic, improving Patient endorses improvement in neuropathy with gabapentin, however still endorses mild discomfort at night. Will check B12 today. Patient reports history of back trauma in the past which could also be causing symptoms. A1c was 5.0 at least office visit, therefore neuopathy not secondary to diabetes. --Increase gabapentin at night 600 mg and 300 mg in the morning and mid day --Follow up in the next few weeks  #Cough, congestion, rhinorrhea Patient with symptoms consistent with viral URI. Currently using Mucinex for congestion and endorses improvement in symptoms. Patient does not appear toxic, with no constitutional signs concerning for pneumonia. Recommend increase hydration and dextromethorphan for his cough.   Marjie Skiff, MD [facility] PGY-2

## 2017-07-19 LAB — VITAMIN B12: VITAMIN B 12: 727 pg/mL (ref 232–1245)

## 2017-10-22 ENCOUNTER — Other Ambulatory Visit: Payer: Self-pay | Admitting: *Deleted

## 2017-10-22 DIAGNOSIS — G609 Hereditary and idiopathic neuropathy, unspecified: Secondary | ICD-10-CM

## 2017-10-22 MED ORDER — GABAPENTIN 300 MG PO CAPS: 300.0000 mg | ORAL_CAPSULE | Freq: Three times a day (TID) | ORAL | 3 refills | Status: DC

## 2017-11-25 ENCOUNTER — Ambulatory Visit (INDEPENDENT_AMBULATORY_CARE_PROVIDER_SITE_OTHER): Payer: Medicare Other | Admitting: Family Medicine

## 2017-11-25 ENCOUNTER — Other Ambulatory Visit: Payer: Self-pay

## 2017-11-25 ENCOUNTER — Encounter: Payer: Self-pay | Admitting: Family Medicine

## 2017-11-25 ENCOUNTER — Telehealth: Payer: Self-pay | Admitting: Family Medicine

## 2017-11-25 VITALS — BP 112/78 | HR 61 | Temp 97.8°F | Ht 72.0 in | Wt 278.6 lb

## 2017-11-25 DIAGNOSIS — M79604 Pain in right leg: Secondary | ICD-10-CM

## 2017-11-25 MED ORDER — TRAMADOL HCL 50 MG PO TABS: 50.0000 mg | ORAL_TABLET | Freq: Every day | ORAL | 0 refills | Status: AC | PRN

## 2017-11-25 NOTE — Progress Notes (Signed)
Subjective: Chief Complaint  Patient presents with  . possible blood clot in right leg     HPI: Timothy Sheppard is a 72 y.o. presenting to clinic today to discuss the following:  Right Leg pain with concern for blood clot in leg Patient states that in 1968 he fractured his right femur, had a rod placed, and it had never given him problems until 1 month ago. Approximately 1 month ago he began having right leg lateral thigh pain along the scar tissue that radiated down his leg. It is described as a "throbbing" pain that sometimes burns and it is worse at night. It is also worse with movements and when he bends over or lays flat on his back. The pain is intermittent and rated at a 10/10. He denies any redness, swelling, rash, or itching. He has tried OTC pain medications with only temporary relief and the pain is occurring about every day.  He is also concerned he may have a blood clot in his legs as his father died of one that "traveled to his heart and killed him".   Health Maintenance: none     ROS noted in HPI.   Past Medical, Surgical, Social, and Family History Reviewed & Updated per EMR.   Pertinent Historical Findings include:   Social History   Tobacco Use  Smoking Status Former Smoker  . Types: Pipe, Cigars  Smokeless Tobacco Never Used  Tobacco Comment   occasionally   Objective: BP 112/78   Pulse 61   Temp 97.8 F (36.6 C) (Oral)   Ht 6' (1.829 m)   Wt 278 lb 9.6 oz (126.4 kg)   SpO2 97%   BMI 37.78 kg/m  Vitals and nursing notes reviewed  Physical Exam  Constitutional: He is oriented to person, place, and time. He appears well-developed and well-nourished. No distress.  HENT:  Head: Normocephalic and atraumatic.  Eyes: Pupils are equal, round, and reactive to light. EOM are normal.  Neck: Normal range of motion. Neck supple. No JVD present. No tracheal deviation present.  Cardiovascular: Normal rate, regular rhythm, normal heart sounds and intact  distal pulses.  No murmur heard. Pulmonary/Chest: Effort normal and breath sounds normal. He has no wheezes. He has no rales. He exhibits no tenderness.  Abdominal: Soft. Bowel sounds are normal. He exhibits no distension. There is no tenderness. There is no guarding.  Musculoskeletal: He exhibits tenderness. He exhibits no edema or deformity.       Right hip: He exhibits decreased range of motion, tenderness and bony tenderness. He exhibits normal strength, no swelling, no deformity and no laceration.       Left hip: Normal.       Right knee: He exhibits no bony tenderness. No tenderness found.       Left knee: He exhibits no bony tenderness. No tenderness found.       Right lower leg: He exhibits tenderness. He exhibits no swelling and no edema.       Left lower leg: He exhibits tenderness. He exhibits no swelling and no edema.  FABER negative bilaterally, Greater trochanter of right hip not TTP Positive Homman's sign on the right  Neurological: He is alert and oriented to person, place, and time.  Skin: Skin is warm and dry. Capillary refill takes less than 2 seconds. No rash noted. No erythema.   No results found for this or any previous visit (from the past 72 hour(s)).  Assessment/Plan:  Right leg pain  Most likely diagnosis is arthritis of the hip but could also be due to intramedullary rod causing irritation or possibly becoming loose. No acute injury or fall so unlikely to be  Not likely to be infection as patient has had no temperature, good pain relief with Tylenol and Ibuprofen, and he shows no physical signs of infected joint.  Obtain right hip x-ray. Gave Tramadol for acute severe episodes of pain.  Unlikely to have DVT but will perform right leg u/s doppler to rule out given he did have calf tenderness and positive Homann sign  PATIENT EDUCATION PROVIDED: See AVS    Diagnosis and plan along with any newly prescribed medication(s) were discussed in detail with this patient  today. The patient verbalized understanding and agreed with the plan. Patient advised if symptoms worsen return to clinic or ER.   Health Maintainance:   Orders Placed This Encounter  Procedures  . US Venous Img Lower Unilateral Right    Standing Status:   Future    Standing Expiration Date:   05/28/2018    Order Specific Question:   Reason for Exam (SYMPTOM  OR DIAGNOSIS REQUIRED)    Answer:   right leg pain, rule out DVT    Order Specific Question:   Preferred imaging location?    Answer:   [facility]  . DG HIP UNILAT WITH PELVIS 2-3 VIEWS RIGHT    Standing Status:   Future    Number of Occurrences:   1    Standing Expiration Date:   01/26/2019    Order Specific Question:   Reason for Exam (SYMPTOM  OR DIAGNOSIS REQUIRED)    Answer:   right leg pain    Order Specific Question:   Preferred imaging location?    Answer:   [facility]    Order Specific Question:   Radiology Contrast Protocol - do NOT remove file path    Answer:   [email or web address]    Meds ordered this encounter  Medications  . traMADol (ULTRAM) 50 MG tablet    Sig: Take 1 tablet (50 mg total) by mouth daily as needed for up to 5 days for moderate pain or severe pain.    Dispense:  30 tablet    Refill:  0    Harolyn Rutherford, DO 11/27/2017, 11:40 AM PGY-1, [place]

## 2017-11-25 NOTE — Patient Instructions (Signed)
It was great to meet you today! Thank you for letting me participate in your care!  Today, we discussed your right leg pain. I think it is most likely arthritis pain. We will get an x-ray of the hip and a right leg ultrasound to ensure you do not have a blood clot but I do not suspect this is the case.  Please take Tylenol extra strength x 1 in combination with Ibuprofen 200mg  x 2 every 8 hours, use a heating pads, muscle creams, and stretching to help control the pain. I have given you a short term supply of Tramadol to use when the pain is out of control. Please use it only when necessary.  I will contact you with results of the imaging studies I have ordered today.  Be well, Harolyn Rutherford, DO PGY-1, [facility]

## 2017-11-25 NOTE — Telephone Encounter (Signed)
Spoke with pt and told him I was unsure if I could transfer a STAT test to another hospital. Pt stated he would keep his appt for tomorrow. Deseree Kennon Holter, CMA

## 2017-11-25 NOTE — Telephone Encounter (Signed)
Pt was seen this morning and had orders placed to have an Xray and ultra sound done tomorrow at [facility]. Pt said Dr Garlan Fillers told him it could be possible to have these done at [facility]. He wants to know if it is possible to cancel his appointments at [facility] tomorrow and have them done at [place] would like to be contacted ASAP at [phone]

## 2017-11-26 ENCOUNTER — Ambulatory Visit (HOSPITAL_COMMUNITY)
Admission: RE | Admit: 2017-11-26 | Discharge: 2017-11-26 | Disposition: A | Discharge status: Home / Self Care | Payer: Medicare Other | Source: Ambulatory Visit | Attending: Family Medicine | Admitting: Family Medicine

## 2017-11-26 DIAGNOSIS — S7291XD Unspecified fracture of right femur, subsequent encounter for closed fracture with routine healing: Secondary | ICD-10-CM | POA: Insufficient documentation

## 2017-11-26 DIAGNOSIS — M79604 Pain in right leg: Secondary | ICD-10-CM | POA: Diagnosis not present

## 2017-11-26 DIAGNOSIS — Z9689 Presence of other specified functional implants: Secondary | ICD-10-CM | POA: Insufficient documentation

## 2017-11-26 DIAGNOSIS — M25551 Pain in right hip: Secondary | ICD-10-CM | POA: Diagnosis not present

## 2017-11-26 NOTE — Progress Notes (Signed)
VASCULAR LAB PRELIMINARY  PRELIMINARY  PRELIMINARY  PRELIMINARY  Right lower extremity venous duplex completed.    Preliminary report:  There is no DVT or SVT noted in the right lower extremity.   Arshan Jabs, RVT 11/26/2017, 1:17 PM

## 2017-11-27 ENCOUNTER — Encounter: Payer: Self-pay | Admitting: Family Medicine

## 2017-11-27 ENCOUNTER — Other Ambulatory Visit: Payer: Self-pay | Admitting: Family Medicine

## 2017-11-27 DIAGNOSIS — G8929 Other chronic pain: Secondary | ICD-10-CM

## 2017-11-27 DIAGNOSIS — M79604 Pain in right leg: Secondary | ICD-10-CM

## 2017-11-27 HISTORY — DX: Other chronic pain: G89.29

## 2017-11-27 NOTE — Progress Notes (Signed)
Will call patient with results.   

## 2017-11-27 NOTE — Assessment & Plan Note (Addendum)
Most likely diagnosis is arthritis of the hip but could also be due to intramedullary rod causing irritation or possibly becoming loose. No acute injury or fall so unlikely to be  Not likely to be infection as patient has had no temperature, good pain relief with Tylenol and Ibuprofen, and he shows no physical signs of infected joint.  Obtain right hip x-ray. Gave Tramadol for acute severe episodes of pain.  Unlikely to have DVT but will perform right leg u/s doppler to rule out given he did have calf tenderness and positive Homann sign

## 2017-11-27 NOTE — Progress Notes (Unsigned)
Called patient and informed him that I will order a left leg x-ray and also a right leg x-ray for comparison.

## 2017-11-28 ENCOUNTER — Ambulatory Visit (HOSPITAL_COMMUNITY)
Admission: RE | Admit: 2017-11-28 | Discharge: 2017-11-28 | Disposition: A | Discharge status: Home / Self Care | Payer: Medicare Other | Source: Ambulatory Visit | Attending: Family Medicine | Admitting: Family Medicine

## 2017-11-28 DIAGNOSIS — M79604 Pain in right leg: Secondary | ICD-10-CM | POA: Diagnosis not present

## 2017-11-28 DIAGNOSIS — M79651 Pain in right thigh: Secondary | ICD-10-CM | POA: Diagnosis not present

## 2017-12-03 ENCOUNTER — Encounter: Payer: Self-pay | Admitting: Family Medicine

## 2017-12-03 ENCOUNTER — Telehealth: Payer: Self-pay

## 2017-12-03 NOTE — Telephone Encounter (Signed)
Pt requesting call back from MD about xray results, he has further questions. Call back [phone] Wallace Cullens, RN

## 2017-12-03 NOTE — Progress Notes (Signed)
Called patient to inform him of test results. Patient did not pick up and no voicemail available.

## 2017-12-03 NOTE — Telephone Encounter (Signed)
Attemtped to call back.  No machine and no answer.   Message from Dr. Garlan Fillers:  Garlan Fillers, Christia Reading, DO  P [facility]        Can we please reach out to him and let him know I would like him to be seen by his orthopedic doctor to evaluate whether or not his femoral rod needs revision. Thanks!   Tim

## 2017-12-04 NOTE — Telephone Encounter (Signed)
Attempted to call again.  Same response.  Will create and route letter to admin to mail. Fleeger, Salome Spotted, CMA

## 2017-12-11 ENCOUNTER — Telehealth: Payer: Self-pay

## 2017-12-11 NOTE — Telephone Encounter (Signed)
Pt left message on nurse line requesting call back from MD. Has questions about recent imaging. Call back [phone] Wallace Cullens, RN

## 2017-12-11 NOTE — Telephone Encounter (Signed)
Pt is returning Dr Clydene Fake phone call. Cell number [phone]

## 2017-12-12 NOTE — Telephone Encounter (Signed)
Timothy Sheppard,  Please have Dr.lockamy who saw this patient and order the imaging call back the patient and answer the questions. I'm currently on vacation.   Thanks   Neftali Abair

## 2017-12-19 ENCOUNTER — Other Ambulatory Visit: Payer: Self-pay | Admitting: Family Medicine

## 2017-12-19 DIAGNOSIS — M79604 Pain in right leg: Secondary | ICD-10-CM

## 2017-12-19 MED ORDER — TRAMADOL HCL 50 MG PO TABS: 50.0000 mg | ORAL_TABLET | Freq: Four times a day (QID) | ORAL | 0 refills | Status: AC | PRN

## 2017-12-19 NOTE — Progress Notes (Signed)
Called patient to inform him of x-ray results. Some mid femoral remodeling with cephalic migration of intramedullary femoral rod. Informed patient given his new onset leg pain that I think it is best for him to be seen by an orthopedic surgeon to see if a revision vs removal would be required.

## 2018-01-09 ENCOUNTER — Telehealth: Payer: Self-pay | Admitting: Primary Care

## 2018-01-09 ENCOUNTER — Encounter: Payer: Self-pay | Admitting: Primary Care

## 2018-01-09 ENCOUNTER — Ambulatory Visit (INDEPENDENT_AMBULATORY_CARE_PROVIDER_SITE_OTHER): Payer: Medicare Other | Admitting: Primary Care

## 2018-01-09 VITALS — BP 130/70 | HR 51 | Temp 98.1°F | Ht 71.0 in | Wt 279.0 lb

## 2018-01-09 DIAGNOSIS — N401 Enlarged prostate with lower urinary tract symptoms: Secondary | ICD-10-CM | POA: Diagnosis not present

## 2018-01-09 DIAGNOSIS — N529 Male erectile dysfunction, unspecified: Secondary | ICD-10-CM

## 2018-01-09 DIAGNOSIS — M79604 Pain in right leg: Secondary | ICD-10-CM

## 2018-01-09 DIAGNOSIS — I10 Essential (primary) hypertension: Secondary | ICD-10-CM | POA: Diagnosis not present

## 2018-01-09 DIAGNOSIS — R739 Hyperglycemia, unspecified: Secondary | ICD-10-CM

## 2018-01-09 DIAGNOSIS — R3912 Poor urinary stream: Secondary | ICD-10-CM | POA: Diagnosis not present

## 2018-01-09 DIAGNOSIS — G629 Polyneuropathy, unspecified: Secondary | ICD-10-CM

## 2018-01-09 DIAGNOSIS — I509 Heart failure, unspecified: Secondary | ICD-10-CM | POA: Diagnosis not present

## 2018-01-09 MED ORDER — TAMSULOSIN HCL 0.4 MG PO CAPS: ORAL_CAPSULE | ORAL | 0 refills | Status: DC

## 2018-01-09 NOTE — Assessment & Plan Note (Signed)
To lower extremities since back injury years ago. Doing well on gabapentin and is taking mostly 2 times daily. Continue same.

## 2018-01-09 NOTE — Assessment & Plan Note (Signed)
Could be secondary to orthopedic appliance vs osteoarthritis. Discussed to use Tramadol sparingly. Also recommended he follow through with the orthopedic surgeon as recommended.

## 2018-01-09 NOTE — Progress Notes (Signed)
Subjective:    Patient ID: Timothy Sheppard, male    DOB: April 29, 1946, 72 y.o.   MRN: [id number]  HPI  Timothy Sheppard is a 72 year old male who presents today to establish care and discuss the problems mentioned below. Will review old records.  1) Essential Hypertension: Currently managed on HCTZ 12.5 mg, lisinopril 20 mg, metoprolol succinate 50 mg. He was following with cardiology who released him in November 2018. Denies chest pain, dizziness, shortness of breath.  BP Readings from Last 3 Encounters:  01/09/18 130/70  11/25/17 112/78  07/18/17 128/64    2) Chest Pain: History of in the past, evaluated by cardiology in November 2018 also in 2015. Underwent echocardiogram in 2015 with LV EF of 51%, normal left ventricular function, normal wall motion. He also underwent Myoview Lexican with low exercise level which was unremarkable.   Lipid panel in September 2018 with LDL of 61. Not managed on statin.  3) Iron Deficiency Anemia: Prior history of last year, improved with oral iron supplementation. He is no longer taking oral iron.   4) Lower Extremity Pain: Evaluated in early May 2019 with complaints of right lateral thigh pain along the lin of his surgical scar from his accident in 1968. He has a rod in place. During his evaluation in May he underwent DVT evaluation which was negative. The xray of his right femur showed "chronic thickening/remodeling", also migration of intramedullary rod with 1/3 of the length within the soft tissues of the gluteal area. It was advised he see an orthopedic surgeon given his symptoms.  He has not followed up with the orthopedic surgeon as recommended. He is currently taking Tramadol 50 mg at bedtime for pain with improvement. He's been taking this for the past one month.   5) Erectile Dysfunction: Currently managed on sildenafil 100 mg for which he takes 1-2 times monthly.   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 Currently managed  on gabapentin 300 mg for which he takes twice daily. Overall feels well managed.   7) BPH: Symptoms of difficulty initiating urination, bladder emptying, also with slow stream. This has been problematic for years. He was once managed on Flomax, thinks this helped and is not sure why he came off. He is interested in treatment again. He denies hematuria, dysuria, nocturia.   Review of Systems  Constitutional: Negative for fatigue.  Eyes: Negative for visual disturbance.  Respiratory: Negative for shortness of breath.   Cardiovascular: Negative for chest pain.  Gastrointestinal: Negative for blood in stool.  Genitourinary: Positive for difficulty urinating.       Slow urinary stream, erectile dysfunction  Musculoskeletal: Positive for arthralgias and back pain.  Skin: Negative for color change.  Neurological:       Neuropathic pain from lower back to bilateral lower extremities   Hematological: Negative for adenopathy.       Past Medical History:  Diagnosis Date  . [place]   . Chronic hip pain    Right  . Erectile dysfunction   . Hyperlipidemia   . Hypertension   . Neuropathy      Social History   Socioeconomic History  . Marital status: Married    Spouse name: Not on file  . Number of children: Not on file  . Years of education: Not on file  . Highest education level: Not on file  Occupational History  . Not on file  Social Needs  . Emergency planning/management officer  strain: Not on file  . Food insecurity:    Worry: Not on file    Inability: Not on file  . Transportation needs:    Medical: Not on file    Non-medical: Not on file  Tobacco Use  . Smoking status: Former Smoker    Types: Pipe, Landscape architect  . Smokeless tobacco: Never Used  . Tobacco comment: occasionally  Substance and Sexual Activity  . Alcohol use: No  . Drug use: No  . Sexual activity: Not on file  Lifestyle  . Physical activity:    Days per week: Not on file    Minutes per session:  Not on file  . Stress: Not on file  Relationships  . Social connections:    Talks on phone: Not on file    Gets together: Not on file    Attends religious service: Not on file    Active member of club or organization: Not on file    Attends meetings of clubs or organizations: Not on file    Relationship status: Not on file  . Intimate partner violence:    Fear of current or ex partner: Not on file    Emotionally abused: Not on file    Physically abused: Not on file    Forced sexual activity: Not on file  Other Topics Concern  . Not on file  Social History Narrative   Married.   2 children, 2 grandchildren, 2 great grandchilden.   Retired.     Past Surgical History:  Procedure Laterality Date  . BACK SURGERY  2000  . HERNIA REPAIR    . ORIF FEMUR FRACTURE Right 1967   rod insertion  . TONSILLECTOMY AND ADENOIDECTOMY      Family History  Problem Relation Age of Onset  . Heart failure Mother   . Lupus Mother   . Heart failure Father     No Known Allergies  Current Outpatient Medications on File Prior to Visit  Medication Sig Dispense Refill  . aspirin 81 MG tablet Take 81 mg by mouth daily.    Marland Kitchen gabapentin (NEURONTIN) 300 MG capsule Take 1 capsule (300 mg total) by mouth 3 (three) times daily. 90 capsule 3  . hydrochlorothiazide (MICROZIDE) 12.5 MG capsule Take 1 capsule (12.5 mg total) by mouth daily. 90 capsule 3  . lisinopril (PRINIVIL,ZESTRIL) 20 MG tablet Take 1 tablet (20 mg total) by mouth daily. 90 tablet 3  . metoprolol succinate (TOPROL-XL) 50 MG 24 hr tablet Take 1 tablet (50 mg total) by mouth daily. Take with or immediately following a meal. 90 tablet 3  . sildenafil (VIAGRA) 100 MG tablet Take 1 tablet (100 mg total) by mouth daily as needed for erectile dysfunction. 60 tablet 0  . traMADol (ULTRAM) 50 MG tablet Take by mouth at bedtime as needed.     No current facility-administered medications on file prior to visit.     BP 130/70   Pulse (!) 51    Temp 98.1 F (36.7 C) (Oral)   Ht 5\' 11"  (1.803 m)   Wt 279 lb (126.6 kg)   SpO2 98%   BMI 38.91 kg/m    Objective:   Physical Exam  Constitutional: He is oriented to person, place, and time. He appears well-nourished.  Neck: Neck supple.  Cardiovascular: Normal rate and regular rhythm.  Respiratory: Effort normal and breath sounds normal.  Musculoskeletal:  Ambulates well in clinic without assistive device  Neurological: He is alert and oriented to person, place, and  time.  Skin: Skin is warm and dry.  Psychiatric: He has a normal mood and affect.           Assessment & Plan:

## 2018-01-09 NOTE — Assessment & Plan Note (Signed)
Stable in the office today, HR in lower 50's.  Recently evaluated by cardiology last Fall. Continue current regimen. BMP from September 2018 reviewed.

## 2018-01-09 NOTE — Assessment & Plan Note (Signed)
A1C of 5.0 in November 2018. Will continue to monitor.

## 2018-01-09 NOTE — Assessment & Plan Note (Signed)
PSA due this Fall during CPE. Symptoms today represent BPH. Will re-initiate Flomax as this was helpful in the past. Rx sent to pharmacy, he will update in a few weeks.

## 2018-01-09 NOTE — Telephone Encounter (Signed)
Appt scheduled with Dr Charlton Haws and patient notified.

## 2018-01-09 NOTE — Telephone Encounter (Signed)
Timothy Sheppard, this patient was referred to orthopedics in May 2019 for a shifted rod that's located to his right upper leg. He's not heard back regarding an appointment, is there anyway we can figure out what's going on?

## 2018-01-09 NOTE — Assessment & Plan Note (Signed)
Using Viagra sparingly. Continue same.

## 2018-01-09 NOTE — Patient Instructions (Addendum)
Speak with Rosaria Ferries about the orthopedic surgeon referral.  Please schedule a physical with me in November this year, schedule a Medicare Wellness Visit with our nurse. You will get labs done the day you meet with the nurse. We will discuss your lab results in detail during your physical.  Use the Tramadol sparingly as needed for pain. Speak with the orthopedic surgeon about this.  It was a pleasure to meet you today! Please don't hesitate to call or message me with any questions. Welcome to [facility]!

## 2018-01-09 NOTE — Assessment & Plan Note (Signed)
Previously following with cardiology, recently released. Reviewed prior echocardiogram and stress test. He is asymptomatic.

## 2018-01-15 DIAGNOSIS — M25551 Pain in right hip: Secondary | ICD-10-CM | POA: Diagnosis not present

## 2018-01-23 ENCOUNTER — Ambulatory Visit (INDEPENDENT_AMBULATORY_CARE_PROVIDER_SITE_OTHER): Payer: Medicare Other | Admitting: Internal Medicine

## 2018-01-23 ENCOUNTER — Ambulatory Visit: Payer: Self-pay | Admitting: *Deleted

## 2018-01-23 ENCOUNTER — Encounter: Payer: Self-pay | Admitting: Internal Medicine

## 2018-01-23 VITALS — BP 128/80 | HR 70 | Temp 98.0°F | Ht 71.0 in | Wt 276.0 lb

## 2018-01-23 DIAGNOSIS — L255 Unspecified contact dermatitis due to plants, except food: Secondary | ICD-10-CM | POA: Diagnosis not present

## 2018-01-23 HISTORY — DX: Unspecified contact dermatitis due to plants, except food: L25.5

## 2018-01-23 MED ORDER — PREDNISONE 20 MG PO TABS: 40.0000 mg | ORAL_TABLET | Freq: Every day | ORAL | 0 refills | Status: DC

## 2018-01-23 MED ORDER — METHYLPREDNISOLONE ACETATE 80 MG/ML IJ SUSP
80.0000 mg | Freq: Once | INTRAMUSCULAR | Status: AC
  Administered 2018-01-23: 80 mg via INTRAMUSCULAR

## 2018-01-23 NOTE — Telephone Encounter (Signed)
Will assess at OV 

## 2018-01-23 NOTE — Progress Notes (Signed)
Subjective:    Patient ID: Timothy Sheppard, male    DOB: 05-Aug-1945, 72 y.o.   MRN: [id number]  HPI Here due to rash--with wife  Was in yard playing with grandson Did reach and grab a handful of weeds---- about a week ago Noticed rash fairly quickly--arms, hands and then face Going on 6 days or so  Bad burning and itching  Current Outpatient Medications on File Prior to Visit  Medication Sig Dispense Refill  . aspirin 81 MG tablet Take 81 mg by mouth daily.    Marland Kitchen gabapentin (NEURONTIN) 300 MG capsule Take 1 capsule (300 mg total) by mouth 3 (three) times daily. 90 capsule 3  . metoprolol succinate (TOPROL-XL) 50 MG 24 hr tablet Take 1 tablet (50 mg total) by mouth daily. Take with or immediately following a meal. 90 tablet 3  . sildenafil (VIAGRA) 100 MG tablet Take 1 tablet (100 mg total) by mouth daily as needed for erectile dysfunction. 60 tablet 0  . tamsulosin (FLOMAX) 0.4 MG CAPS capsule Take 1 capsule by mouth every morning for urine stream. 90 capsule 0  . traMADol (ULTRAM) 50 MG tablet Take by mouth at bedtime as needed.    . hydrochlorothiazide (MICROZIDE) 12.5 MG capsule Take 1 capsule (12.5 mg total) by mouth daily. 90 capsule 3  . lisinopril (PRINIVIL,ZESTRIL) 20 MG tablet Take 1 tablet (20 mg total) by mouth daily. 90 tablet 3   No current facility-administered medications on file prior to visit.     No Known Allergies  Past Medical History:  Diagnosis Date  . CHF (congestive heart failure) ([place])   . Chronic hip pain    Right  . Erectile dysfunction   . Hyperlipidemia   . Hypertension   . Neuropathy     Past Surgical History:  Procedure Laterality Date  . BACK SURGERY  2000  . HERNIA REPAIR    . ORIF FEMUR FRACTURE Right 1967   rod insertion  . TONSILLECTOMY AND ADENOIDECTOMY      Family History  Problem Relation Age of Onset  . Heart failure Mother   . Lupus Mother   . Heart failure Father     Social History   Socioeconomic History  .  Marital status: Married    Spouse name: Not on file  . Number of children: Not on file  . Years of education: Not on file  . Highest education level: Not on file  Occupational History  . Not on file  Social Needs  . Financial resource strain: Not on file  . Food insecurity:    Worry: Not on file    Inability: Not on file  . Transportation needs:    Medical: Not on file    Non-medical: Not on file  Tobacco Use  . Smoking status: Former Smoker    Types: Pipe, Landscape architect  . Smokeless tobacco: Never Used  . Tobacco comment: occasionally  Substance and Sexual Activity  . Alcohol use: No  . Drug use: No  . Sexual activity: Not on file  Lifestyle  . Physical activity:    Days per week: Not on file    Minutes per session: Not on file  . Stress: Not on file  Relationships  . Social connections:    Talks on phone: Not on file    Gets together: Not on file    Attends religious service: Not on file    Active member of club or organization: Not on file    Attends  meetings of clubs or organizations: Not on file    Relationship status: Not on file  . Intimate partner violence:    Fear of current or ex partner: Not on file    Emotionally abused: Not on file    Physically abused: Not on file    Forced sexual activity: Not on file  Other Topics Concern  . Not on file  Social History Narrative   Married.   2 children, 2 grandchildren, 2 great grandchilden.   Retired.    Review of Systems  No fever Not feeling sick     Objective:   Physical Exam  Constitutional: He appears well-developed. No distress.  Skin:  Moderate rash around eyes and cheeks Also on forearms           Assessment & Plan:

## 2018-01-23 NOTE — Telephone Encounter (Signed)
Pt called with having been exposed to poison oak. He has a rash on his lower arms and now his face, around his eyes. It has not gotten in his eyes. Rash started on Saturday. No difficulty breathing or fever. He had been trying to treat this himself, using Benadryl.  Per protocol, pt has an appointment for today already scheduled. Advised to call back for worsening symptoms, pt voiced understanding. Home care advice also given. Will route to flow at [facility]   Reason for Disposition . SEVERE itching (e.g., interferes with sleep or normal activities)  Answer Assessment - Initial Assessment Questions 1. APPEARANCE of RASH: "Describe the rash."      Red and raised 2. LOCATION: "Where is the rash located?"      Lower arms, face and around eyes 3. SIZE: "How large is the rash?"      large 4. ONSET: "When did the rash begin?"      Saturday 5. ITCHING: "Does the rash itch?" If so, ask: "How bad is it?"   - MILD - doesn't interfere with normal activities   - MODERATE - SEVERE: interferes with work, school, sleep, or other activities      Moderate- severe  Protocols used: POISON IVY - OAK - SUMAC-A-AH

## 2018-01-23 NOTE — Assessment & Plan Note (Signed)
Sig rash still--especially on face Will give depomedrol 80mg  Prednisone with wean antihistamine

## 2018-01-23 NOTE — Addendum Note (Signed)
Addended by: Pilar Grammes on: 01/23/2018 12:58 PM   Modules accepted: Orders

## 2018-01-23 NOTE — Patient Instructions (Signed)
You can use benedryl at night---you may want to try cetirizine 10mg  or fexofenadine 180mg  daily.

## 2018-01-23 NOTE — Telephone Encounter (Signed)
Pt has appt 01/23/18 at 12:30 with Dr Silvio Pate.

## 2018-01-28 ENCOUNTER — Other Ambulatory Visit: Payer: Self-pay | Admitting: Primary Care

## 2018-01-28 NOTE — Telephone Encounter (Signed)
Patient called and states he was prescribed predniSONE (DELTASONE) 20 MG tablet for poison oak. Patient states it is getting better he just have a little bit left of poison oak. He would like a refill of this medication.  Last OV:01/23/18 Last refill:01/23/18 9 tab/0 refill [id number] Pharmacy: [id number] [place], [place] [phone] (Phone) [phone] (Fax)

## 2018-01-28 NOTE — Telephone Encounter (Signed)
Copied from [place] [id number]. Topic: Quick Communication - See Telephone Encounter >> Jan 28, 2018  2:20 PM Mylinda Latina, NT wrote: CRM for notification. See Telephone encounter for: 01/28/18. Patient called and states he was prescribed predniSONE (DELTASONE) 20 MG tablet for poison Oak. Patient states it is getting better he just have a little bit on his own. He would like a refill of this medication .  [id number] [place], [place] [phone] (Phone) [phone] (Fax)

## 2018-01-28 NOTE — Telephone Encounter (Signed)
Routing to treating MD. 

## 2018-01-29 ENCOUNTER — Ambulatory Visit: Payer: Medicare Other | Admitting: Internal Medicine

## 2018-01-29 MED ORDER — PREDNISONE 20 MG PO TABS: 20.0000 mg | ORAL_TABLET | Freq: Every day | ORAL | 0 refills | Status: DC

## 2018-01-29 NOTE — Telephone Encounter (Signed)
Spoke to  pt's wife .

## 2018-01-29 NOTE — Telephone Encounter (Signed)
Let him know I extended it to take 1 tab daily for an additional 5 days

## 2018-02-04 ENCOUNTER — Other Ambulatory Visit: Payer: Self-pay | Admitting: Primary Care

## 2018-02-04 DIAGNOSIS — M79604 Pain in right leg: Secondary | ICD-10-CM

## 2018-02-04 NOTE — Telephone Encounter (Signed)
Name of Medication: tramadol 50 mg Name of Pharmacy: [facility] [address] Last Fill or Written Date and Quantity: # 30 on 12/19/17 by Dr Nuala Alpha for rt leg pain Last Office Visit and Type: 01/09/18 to establish care Next Office Visit and Type:06/16/18 CPX Last Controlled Substance Agreement Date: none Last [id number]

## 2018-02-04 NOTE — Telephone Encounter (Signed)
Has he followed up with his orthopedic surgeon as discussed during his visit? How often is he taking this Tramadol?  Please notify him that this is a controlled substance medication that should not be taken daily.

## 2018-02-04 NOTE — Telephone Encounter (Signed)
Copied from [place] [id number]. Topic: General - Other >> Feb 04, 2018  9:04 AM Lennox Solders wrote: Reason for CRM: pt wife is calling and her husband needs a refill on tramadol. Pt wife said pharm told her to call md office . Pt had tramadol last refill 12-19-17. This med was last refill by another provider

## 2018-02-05 NOTE — Telephone Encounter (Signed)
Wife called back and stated the patient is taking the Tramadol one every other night.  They would like for the medication to be refilled and sent to [facility] [place].

## 2018-02-05 NOTE — Telephone Encounter (Signed)
Spoken and notified patient's wife of Tawni Millers comments. Patient's wife stated that he is no longer seeing the orthopedic surgeon. She stated that he need ask to primary. Patient is taking once a day at bedtime.

## 2018-02-06 MED ORDER — TRAMADOL HCL 50 MG PO TABS: 50.0000 mg | ORAL_TABLET | Freq: Every evening | ORAL | 0 refills | Status: DC | PRN

## 2018-02-06 NOTE — Telephone Encounter (Signed)
Will send a refill for now but if his pain persists he will need to see an orthopedic surgeon as originally told during his visit for his leg in May 2019 at [facility]. Does he need a referral?

## 2018-02-09 NOTE — Telephone Encounter (Signed)
Message left for patient to return my call.  

## 2018-02-10 NOTE — Telephone Encounter (Signed)
Spoken and notified patient's spouse of Timothy Sheppard comments. Patient's spouse stated that patient did see the orthopedic surgeon. Was told that his primacy md will need to continue to prescription for patient if he has pain.

## 2018-02-11 NOTE — Telephone Encounter (Signed)
Please get him in the office for pain evaluation and further discussion. This isn't match up to what he told me during his initial visit.

## 2018-02-12 NOTE — Telephone Encounter (Signed)
Attempted to contact pt; unable to leave vm. CRM created. pls schedule pt f/u appt with [facility] should he return call

## 2018-02-23 NOTE — Telephone Encounter (Signed)
Late entry. Spoken to patient's wife last week on 02/19/2018. Will schedule follow up when patient need a refill.

## 2018-03-02 ENCOUNTER — Other Ambulatory Visit: Payer: Self-pay | Admitting: Family Medicine

## 2018-03-02 DIAGNOSIS — G609 Hereditary and idiopathic neuropathy, unspecified: Secondary | ICD-10-CM

## 2018-03-04 ENCOUNTER — Encounter: Payer: Self-pay | Admitting: Primary Care

## 2018-03-04 ENCOUNTER — Ambulatory Visit (INDEPENDENT_AMBULATORY_CARE_PROVIDER_SITE_OTHER): Payer: Medicare Other | Admitting: Primary Care

## 2018-03-04 DIAGNOSIS — G8929 Other chronic pain: Secondary | ICD-10-CM

## 2018-03-04 DIAGNOSIS — G609 Hereditary and idiopathic neuropathy, unspecified: Secondary | ICD-10-CM

## 2018-03-04 DIAGNOSIS — G629 Polyneuropathy, unspecified: Secondary | ICD-10-CM

## 2018-03-04 DIAGNOSIS — M79604 Pain in right leg: Secondary | ICD-10-CM | POA: Diagnosis not present

## 2018-03-04 MED ORDER — GABAPENTIN 300 MG PO CAPS: 300.0000 mg | ORAL_CAPSULE | Freq: Three times a day (TID) | ORAL | 1 refills | Status: DC

## 2018-03-04 MED ORDER — AMITRIPTYLINE HCL 25 MG PO TABS: ORAL_TABLET | ORAL | 0 refills | Status: DC

## 2018-03-04 NOTE — Assessment & Plan Note (Signed)
Visited orthopedics, was told that the nail was in the muscle, may not improve with removal.  Discussed that Tramadol shouldn't be taken daily, discussed other options for treatment. He agreed.  Continue gabapentin, will trial amitriptyline 25 mg tablets HS rather than Tramadol. He will update in 2 weeks. May require dose increase.

## 2018-03-04 NOTE — Assessment & Plan Note (Signed)
Doing well on gabapentin, taking 900 mg HS, continue same. Refill sent to pharmacy.

## 2018-03-04 NOTE — Patient Instructions (Addendum)
Start amitriptyline 25 mg tablets at bedtime for pain. Please update me in 2 weeks.   I sent refills for gabapentin to your pharmacy.  We will see you in November this year for your physical.  It was a pleasure to see you today!
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
## 2018-03-31 ENCOUNTER — Telehealth: Payer: Self-pay | Admitting: Primary Care

## 2018-03-31 DIAGNOSIS — G8929 Other chronic pain: Secondary | ICD-10-CM

## 2018-03-31 DIAGNOSIS — M79604 Pain in right leg: Principal | ICD-10-CM

## 2018-03-31 NOTE — Telephone Encounter (Signed)
Please notify him that he is on a low dose of amitriptyline, I recommend we increase to 50 mg. Is he agreeable to try? I do recommend it.

## 2018-03-31 NOTE — Telephone Encounter (Signed)
Copied from [place] [id number]. Topic: Inquiry >> Mar 31, 2018 12:08 PM Oliver Pila B wrote: Reason for CRM: pt states the medication amitriptyline (ELAVIL) 25 MG tablet [[id number]] is not working and wants to go back to tramadol; contact pt to advise

## 2018-03-31 NOTE — Telephone Encounter (Signed)
Spoken to patient. He stated that amitriptyline makes him too drowsy. He even took 1/2 tablet and the next he still could not function. Also it did nothing for the pain he is having. He would like to go back to tramadol.

## 2018-04-02 ENCOUNTER — Ambulatory Visit (INDEPENDENT_AMBULATORY_CARE_PROVIDER_SITE_OTHER): Payer: Medicare Other | Admitting: Emergency Medicine

## 2018-04-02 DIAGNOSIS — Z23 Encounter for immunization: Secondary | ICD-10-CM | POA: Diagnosis not present

## 2018-04-02 NOTE — Telephone Encounter (Signed)
Message left for patient to return my call.  

## 2018-04-02 NOTE — Telephone Encounter (Signed)
Please notify patient that I'll go ahead with Tramadol 50 mg at bedtime. I encourage him to use this medication sparingly if possible. If he starts to require his Tramadol during the day or needs a dose increase then we'll have to send him to pain management.   I would like to see the notes and imaging from the orthopedic surgeon he saw recently, please have him sign for records and put the name of the surgeon he saw.   Please let me know if he's agreeable and I'll send the Rx.

## 2018-04-02 NOTE — Progress Notes (Signed)
Per orders of Allie Bossier, injection of influenza given by Elmon Kirschner. Patient tolerated injection well.

## 2018-04-03 MED ORDER — TRAMADOL HCL 50 MG PO TABS: 50.0000 mg | ORAL_TABLET | Freq: Every evening | ORAL | 0 refills | Status: DC | PRN

## 2018-04-03 NOTE — Telephone Encounter (Signed)
Spoken and notified patient of Timothy Sheppard comments. Patient stated that the reason why he request to go back to Tramadol because it helps with the pain that occurs when he staying still at night. Patient stated that he is willing to try any other pain medication if would be less of a hassle. Please advise.

## 2018-04-03 NOTE — Telephone Encounter (Signed)
I'll send a temporary supply to his pharmacy but I still need the notes from his recent orthopedic surgeon visit.  Please have him sign a medical release form, it looks like he may have seen Dr. Amedeo Plenty.

## 2018-04-03 NOTE — Telephone Encounter (Signed)
Pt returning your call/ he will be at home tomorrow

## 2018-04-06 NOTE — Telephone Encounter (Signed)
Spoken and notified patient of Timothy Sheppard comments. Patient verbalized understanding.  Patient will come in and fill out the form

## 2018-04-15 ENCOUNTER — Telehealth: Payer: Self-pay | Admitting: Primary Care

## 2018-04-15 NOTE — Telephone Encounter (Signed)
Timothy Sheppard, I'm not sure who to ask but I requested records from Dr. Vanetta Shawl office for an appointment that he stated he had this year. I received records from a visit from two different orthopedists in 2008 and 1999. 1. Did he actually see Dr. Amedeo Plenty? The referral was placed in May 2019 by a Dr. Garlan Fillers. 2. If so then I need records. I put the signed records request on your desk.

## 2018-04-16 NOTE — Telephone Encounter (Signed)
Called patient and it was Dr Mayer Camel at [facility] that he saw in June 2019. Faxed ROI over to [employer] to obtain that visit information.

## 2018-04-16 NOTE — Telephone Encounter (Signed)
Noted.     Will await records.

## 2018-05-12 ENCOUNTER — Ambulatory Visit: Payer: Medicare Other | Admitting: Internal Medicine

## 2018-05-12 DIAGNOSIS — Z0289 Encounter for other administrative examinations: Secondary | ICD-10-CM

## 2018-05-27 ENCOUNTER — Other Ambulatory Visit: Payer: Self-pay | Admitting: Primary Care

## 2018-05-27 DIAGNOSIS — M79604 Pain in right leg: Secondary | ICD-10-CM

## 2018-06-01 ENCOUNTER — Other Ambulatory Visit: Payer: Self-pay | Admitting: Primary Care

## 2018-06-01 DIAGNOSIS — R3912 Poor urinary stream: Principal | ICD-10-CM

## 2018-06-01 DIAGNOSIS — N401 Enlarged prostate with lower urinary tract symptoms: Secondary | ICD-10-CM

## 2018-06-05 ENCOUNTER — Other Ambulatory Visit: Payer: Self-pay | Admitting: Primary Care

## 2018-06-05 DIAGNOSIS — Z125 Encounter for screening for malignant neoplasm of prostate: Secondary | ICD-10-CM

## 2018-06-05 DIAGNOSIS — R739 Hyperglycemia, unspecified: Secondary | ICD-10-CM

## 2018-06-05 DIAGNOSIS — I10 Essential (primary) hypertension: Secondary | ICD-10-CM

## 2018-06-08 ENCOUNTER — Ambulatory Visit (INDEPENDENT_AMBULATORY_CARE_PROVIDER_SITE_OTHER): Payer: Medicare Other

## 2018-06-08 VITALS — BP 128/70 | HR 60 | Temp 97.9°F | Ht 71.5 in | Wt 278.8 lb

## 2018-06-08 DIAGNOSIS — I10 Essential (primary) hypertension: Secondary | ICD-10-CM | POA: Diagnosis not present

## 2018-06-08 DIAGNOSIS — Z0000 Encounter for general adult medical examination without abnormal findings: Secondary | ICD-10-CM

## 2018-06-08 DIAGNOSIS — Z1159 Encounter for screening for other viral diseases: Secondary | ICD-10-CM

## 2018-06-08 DIAGNOSIS — R739 Hyperglycemia, unspecified: Secondary | ICD-10-CM | POA: Diagnosis not present

## 2018-06-08 DIAGNOSIS — Z125 Encounter for screening for malignant neoplasm of prostate: Secondary | ICD-10-CM

## 2018-06-08 DIAGNOSIS — Z23 Encounter for immunization: Secondary | ICD-10-CM

## 2018-06-08 LAB — LIPID PANEL
Cholesterol: 152 mg/dL (ref 0–200)
HDL: 47.2 mg/dL (ref >39.00)
LDL Cholesterol: 81 mg/dL (ref 0–99)
NONHDL: 104.33
Total CHOL/HDL Ratio: 3
Triglycerides: 118 mg/dL (ref 0.0–149.0)
VLDL: 23.6 mg/dL (ref 0.0–40.0)

## 2018-06-08 LAB — COMPREHENSIVE METABOLIC PANEL
ALT: 10 U/L (ref 0–53)
AST: 15 U/L (ref 0–37)
Albumin: 4.2 g/dL (ref 3.5–5.2)
Alkaline Phosphatase: 43 U/L (ref 39–117)
BUN: 17 mg/dL (ref 6–23)
CALCIUM: 9.2 mg/dL (ref 8.4–10.5)
CHLORIDE: 101 meq/L (ref 96–112)
CO2: 29 meq/L (ref 19–32)
CREATININE: 1.1 mg/dL (ref 0.40–1.50)
GFR: 70.27 mL/min (ref >60.00)
Glucose, Bld: 103 mg/dL — ABNORMAL HIGH (ref 70–99)
POTASSIUM: 4.5 meq/L (ref 3.5–5.1)
SODIUM: 136 meq/L (ref 135–145)
TOTAL PROTEIN: 6.9 g/dL (ref 6.0–8.3)
Total Bilirubin: 0.7 mg/dL (ref 0.2–1.2)

## 2018-06-08 LAB — HEMOGLOBIN A1C: HEMOGLOBIN A1C: 5.9 % (ref 4.6–6.5)

## 2018-06-08 LAB — PSA, MEDICARE: PSA: 0.2 ng/mL (ref 0.10–4.00)

## 2018-06-08 NOTE — Patient Instructions (Signed)
[name] , Thank you for taking time to come for your Medicare Wellness Visit. I appreciate your ongoing commitment to your health goals. Please review the following plan we discussed and let me know if I can assist you in the future.   These are the goals we discussed: Goals    . Increase physical activity     Starting 06/08/2018, I will continue to walk at least 60 minutes every other day.        This is a list of the screening recommended for you and due dates:  Health Maintenance  Topic Date Due  .  Hepatitis C: One time screening is recommended by Center for Disease Control  (CDC) for  adults born from [birth year] through 1963.   05/23/2019*  . Pneumonia vaccines (2 of 2 - PPSV23) 06/09/2018  . Colon Cancer Screening  05/22/2021  . Tetanus Vaccine  05/11/2024  . Flu Shot  Completed  *Topic was postponed. The date shown is not the original due date.   Preventive Care for Adults  A healthy lifestyle and preventive care can promote health and wellness. Preventive health guidelines for adults include the following key practices.  . A routine yearly physical is a good way to check with your health care provider about your health and preventive screening. It is a chance to share any concerns and updates on your health and to receive a thorough exam.  . Visit your dentist for a routine exam and preventive care every 6 months. Brush your teeth twice a day and floss once a day. Good oral hygiene prevents tooth decay and gum disease.  . The frequency of eye exams is based on your age, health, family medical history, use  of contact lenses, and other factors. Follow your health care provider's recommendations for frequency of eye exams.  . Eat a healthy diet. Foods like vegetables, fruits, whole grains, low-fat dairy products, and lean protein foods contain the nutrients you need without too many calories. Decrease your intake of foods high in solid fats, added sugars, and salt. Eat the right  amount of calories for you. Get information about a proper diet from your health care provider, if necessary.  . Regular physical exercise is one of the most important things you can do for your health. Most adults should get at least 150 minutes of moderate-intensity exercise (any activity that increases your heart rate and causes you to sweat) each week. In addition, most adults need muscle-strengthening exercises on 2 or more days a week.  Silver Sneakers may be a benefit available to you. To determine eligibility, you may visit the website: www.silversneakers.com or contact program at (205) 020-9510 Mon-Fri between 8AM-8PM.   . Maintain a healthy weight. The body mass index (BMI) is a screening tool to identify possible weight problems. It provides an estimate of body fat based on height and weight. Your health care provider can find your BMI and can help you achieve or maintain a healthy weight.   For adults 20 years and older: ? A BMI below 18.5 is considered underweight. ? A BMI of 18.5 to 24.9 is normal. ? A BMI of 25 to 29.9 is considered overweight. ? A BMI of 30 and above is considered obese.   . Maintain normal blood lipids and cholesterol levels by exercising and minimizing your intake of saturated fat. Eat a balanced diet with plenty of fruit and vegetables. Blood tests for lipids and cholesterol should begin at age 48 and be repeated  every 5 years. If your lipid or cholesterol levels are high, you are over 50, or you are at high risk for heart disease, you may need your cholesterol levels checked more frequently. Ongoing high lipid and cholesterol levels should be treated with medicines if diet and exercise are not working.  . If you smoke, find out from your health care provider how to quit. If you do not use tobacco, please do not start.  . If you choose to drink alcohol, please do not consume more than 2 drinks per day. One drink is considered to be 12 ounces (355 mL) of beer, 5  ounces (148 mL) of wine, or 1.5 ounces (44 mL) of liquor.  . If you are 65-97 years old, ask your health care provider if you should take aspirin to prevent strokes.  . Use sunscreen. Apply sunscreen liberally and repeatedly throughout the day. You should seek shade when your shadow is shorter than you. Protect yourself by wearing long sleeves, pants, a wide-brimmed hat, and sunglasses year round, whenever you are outdoors.  . Once a month, do a whole body skin exam, using a mirror to look at the skin on your back. Tell your health care provider of new moles, moles that have irregular borders, moles that are larger than a pencil eraser, or moles that have changed in shape or color.

## 2018-06-08 NOTE — Progress Notes (Signed)
PCP notes:   Health maintenance:  PPSV23 - administered Hep C screening - future order created  Abnormal screenings:   None  Patient concerns:   Chronic back as well as bilateral leg and feet pain. Pain scale: 7/10.   Nurse concerns:  None  Next PCP appt:   06/11/18 @  1140

## 2018-06-08 NOTE — Progress Notes (Signed)
Subjective:   Timothy Sheppard is a 72 y.o. male who presents for Medicare Annual (Subsequent) preventive examination.  Review of Systems:  N/A Cardiac Risk Factors include: advanced age (>63men, >8 women)     Objective:     Vitals: BP 128/70 (BP Location: Right Arm, Patient Position: Sitting, Cuff Size: Large)   Pulse 60   Temp 97.9 F (36.6 C) (Oral)   Ht 5' 11.5" (1.816 m) Comment: shoes  Wt 278 lb 12 oz (126.4 kg)   SpO2 97%   BMI 38.34 kg/m   Body mass index is 38.34 kg/m.  Advanced Directives 06/08/2018 11/25/2017 07/18/2017 06/09/2017 05/05/2017 04/14/2017 04/08/2017  Does Patient Have a Medical Advance Directive? Yes No No No No No No  Type of Paramedic of [place];Living will - - - - - -  Copy of [place] in Chart? No - copy requested - - - - - -  Would patient like information on creating a medical advance directive? - No - Patient declined No - Patient declined No - Patient declined No - Patient declined No - Patient declined No - Patient declined  Pre-existing out of facility DNR order (yellow form or pink MOST form) - - - - - - -    Tobacco Social History   Tobacco Use  Smoking Status Light Tobacco Smoker  . Types: Pipe  Smokeless Tobacco Never Used  Tobacco Comment   occasionally     Ready to quit: No Counseling given: No Comment: occasionally   Clinical Intake:  Pre-visit preparation completed: Yes  Pain : 0-10 Pain Score: 7  Pain Type: Chronic pain Pain Location: Generalized Pain Onset: More than a month ago Pain Frequency: Constant     Nutritional Status: BMI > 30  Obese Nutritional Risks: None Diabetes: Yes CBG done?: No Did pt. bring in CBG monitor from home?: No  How often do you need to have someone help you when you read instructions, pamphlets, or other written materials from your doctor or pharmacy?: 1 - Never What is the last grade level you completed in school?: 12th  grade  Interpreter Needed?: No  Comments: pt lives with spouse Information entered by :: LPinson, LPN  Past Medical History:  Diagnosis Date  . CHF (congestive heart failure) ([place])   . Chronic hip pain    Right  . Erectile dysfunction   . Hyperlipidemia   . Hypertension   . Neuropathy    Past Surgical History:  Procedure Laterality Date  . BACK SURGERY  2000  . HERNIA REPAIR    . ORIF FEMUR FRACTURE Right 1967   rod insertion  . TONSILLECTOMY AND ADENOIDECTOMY     Family History  Problem Relation Age of Onset  . Heart failure Mother   . Lupus Mother   . Heart failure Father    Social History   Socioeconomic History  . Marital status: Married    Spouse name: Not on file  . Number of children: Not on file  . Years of education: Not on file  . Highest education level: Not on file  Occupational History  . Not on file  Social Needs  . Financial resource strain: Not on file  . Food insecurity:    Worry: Not on file    Inability: Not on file  . Transportation needs:    Medical: Not on file    Non-medical: Not on file  Tobacco Use  . Smoking status: Light Tobacco Smoker  Types: Pipe  . Smokeless tobacco: Never Used  . Tobacco comment: occasionally  Substance and Sexual Activity  . Alcohol use: No  . Drug use: No  . Sexual activity: Not on file  Lifestyle  . Physical activity:    Days per week: Not on file    Minutes per session: Not on file  . Stress: Not on file  Relationships  . Social connections:    Talks on phone: Not on file    Gets together: Not on file    Attends religious service: Not on file    Active member of club or organization: Not on file    Attends meetings of clubs or organizations: Not on file    Relationship status: Not on file  Other Topics Concern  . Not on file  Social History Narrative   Married.   2 children, 2 grandchildren, 2 great grandchilden.   Retired.     Outpatient Encounter Medications as of 06/08/2018   Medication Sig  . aspirin 81 MG tablet Take 81 mg by mouth daily.  Marland Kitchen gabapentin (NEURONTIN) 300 MG capsule Take 1 capsule (300 mg total) by mouth 3 (three) times daily.  . metoprolol succinate (TOPROL-XL) 50 MG 24 hr tablet Take 1 tablet (50 mg total) by mouth daily. Take with or immediately following a meal.  . tamsulosin (FLOMAX) 0.4 MG CAPS capsule TAKE 1 CAPSULE BY MOUTH EVERY MORNING FOR URINE STREAM.  . traMADol (ULTRAM) 50 MG tablet Take 1 tablet (50 mg total) by mouth at bedtime as needed for severe pain.  . hydrochlorothiazide (MICROZIDE) 12.5 MG capsule Take 1 capsule (12.5 mg total) by mouth daily.  Marland Kitchen lisinopril (PRINIVIL,ZESTRIL) 20 MG tablet Take 1 tablet (20 mg total) by mouth daily.  . [DISCONTINUED] sildenafil (VIAGRA) 100 MG tablet Take 1 tablet (100 mg total) by mouth daily as needed for erectile dysfunction. (Patient not taking: Reported on 06/08/2018)   No facility-administered encounter medications on file as of 06/08/2018.     Activities of Daily Living In your present state of health, do you have any difficulty performing the following activities: 06/08/2018  Hearing? Y  Comment ringing in right ear/hx of ruptured eardrum  Vision? N  Difficulty concentrating or making decisions? N  Walking or climbing stairs? N  Comment back and bilateral leg pain  Dressing or bathing? N  Doing errands, shopping? N  Preparing Food and eating ? N  Using the Toilet? N  In the past six months, have you accidently leaked urine? N  Do you have problems with loss of bowel control? N  Managing your Medications? N  Managing your Finances? N  Housekeeping or managing your Housekeeping? N  Some recent data might be hidden    Patient Care Team: [name], NP as PCP - General (Internal Medicine)    Assessment:   This is a routine wellness examination for Timothy Sheppard.   Hearing Screening   125Hz  250Hz  500Hz  1000Hz  2000Hz  3000Hz  4000Hz  6000Hz  8000Hz   Right ear:   40 40 40  40     Left ear:   40 40 40  40      Visual Acuity Screening   Right eye Left eye Both eyes  Without correction: 20/30 20/30 20/25   With correction:       Exercise Activities and Dietary recommendations Current Exercise Habits: Home exercise routine, Type of exercise: walking, Time (Minutes): 60, Frequency (Times/Week): 4, Weekly Exercise (Minutes/Week): 240, Intensity: Mild, Exercise limited by: None identified  Goals    .  Increase physical activity     Starting 06/08/2018, I will continue to walk at least 60 minutes every other day.        Fall Risk Fall Risk  06/08/2018 11/25/2017 07/18/2017 06/09/2017 04/14/2017  Falls in the past year? 0 No No No No   Depression Screen PHQ 2/9 Scores 06/08/2018 11/25/2017 07/18/2017 06/09/2017  PHQ - 2 Score 0 0 0 0  PHQ- 9 Score 0 - - -     Cognitive Function MMSE - Mini Mental State Exam 06/08/2018  Orientation to time 5  Orientation to Place 5  Registration 3  Attention/ Calculation 0  Recall 3  Language- name 2 objects 0  Language- repeat 1  Language- follow 3 step command 3  Language- read & follow direction 0  Write a sentence 0  Copy design 0  Total score 20     PLEASE NOTE: A Mini-Cog screen was completed. Maximum score is 20. A value of 0 denotes this part of [place] MMSE was not completed or the patient failed this part of the Mini-Cog screening.   Mini-Cog Screening Orientation to Time - Max 5 pts Orientation to Place - Max 5 pts Registration - Max 3 pts Recall - Max 3 pts Language Repeat - Max 1 pts Language Follow 3 Step Command - Max 3 pts     Immunization History  Administered Date(s) Administered  . Influenza,inj,Quad PF,6+ Mos 04/21/2013, 04/11/2015, 04/02/2018  . Influenza-Unspecified 04/14/2017  . Pneumococcal Conjugate-13 06/09/2017  . Pneumococcal Polysaccharide-23 06/08/2018  . Tdap 05/11/2014    Screening Tests Health Maintenance  Topic Date Due  . Hepatitis C Screening  05/23/2019 (Originally  05/29/1946)  . COLONOSCOPY  05/22/2021  . TETANUS/TDAP  05/11/2024  . INFLUENZA VACCINE  Completed  . PNA vac Low Risk Adult  Completed      Plan:     I have personally reviewed, addressed, and noted the following in the patient's chart:  A. Medical and social history B. Use of alcohol, tobacco or illicit drugs  C. Current medications and supplements D. Functional ability and status E.  Nutritional status F.  Physical activity G. Advance directives H. List of other physicians I.  Hospitalizations, surgeries, and ER visits in previous 12 months J.  [place] to include hearing, vision, cognitive, depression L. Referrals and appointments - none  In addition, I have reviewed and discussed with patient certain preventive protocols, quality metrics, and best practice recommendations. A written personalized care plan for preventive services as well as general preventive health recommendations were provided to patient.  See attached scanned questionnaire for additional information.   Signed,   Lindell Noe, MHA, BS, LPN Health Coach

## 2018-06-08 NOTE — Progress Notes (Signed)
I reviewed health advisor's note, was available for consultation, and agree with documentation and plan.  

## 2018-06-09 ENCOUNTER — Encounter: Payer: Medicare Other | Admitting: Primary Care

## 2018-06-11 ENCOUNTER — Encounter: Payer: Self-pay | Admitting: Primary Care

## 2018-06-11 ENCOUNTER — Ambulatory Visit (INDEPENDENT_AMBULATORY_CARE_PROVIDER_SITE_OTHER): Payer: Medicare Other | Admitting: Primary Care

## 2018-06-11 VITALS — BP 120/70 | HR 86 | Temp 98.0°F | Ht 71.0 in | Wt 281.8 lb

## 2018-06-11 DIAGNOSIS — G629 Polyneuropathy, unspecified: Secondary | ICD-10-CM

## 2018-06-11 DIAGNOSIS — M1990 Unspecified osteoarthritis, unspecified site: Secondary | ICD-10-CM

## 2018-06-11 DIAGNOSIS — G609 Hereditary and idiopathic neuropathy, unspecified: Secondary | ICD-10-CM

## 2018-06-11 DIAGNOSIS — Z0000 Encounter for general adult medical examination without abnormal findings: Secondary | ICD-10-CM | POA: Diagnosis not present

## 2018-06-11 DIAGNOSIS — N401 Enlarged prostate with lower urinary tract symptoms: Secondary | ICD-10-CM

## 2018-06-11 DIAGNOSIS — G8929 Other chronic pain: Secondary | ICD-10-CM

## 2018-06-11 DIAGNOSIS — R7303 Prediabetes: Secondary | ICD-10-CM

## 2018-06-11 DIAGNOSIS — I10 Essential (primary) hypertension: Secondary | ICD-10-CM

## 2018-06-11 DIAGNOSIS — I509 Heart failure, unspecified: Secondary | ICD-10-CM | POA: Diagnosis not present

## 2018-06-11 DIAGNOSIS — M79604 Pain in right leg: Secondary | ICD-10-CM

## 2018-06-11 DIAGNOSIS — R3912 Poor urinary stream: Secondary | ICD-10-CM

## 2018-06-11 HISTORY — DX: Encounter for general adult medical examination without abnormal findings: Z00.00

## 2018-06-11 HISTORY — DX: Prediabetes: R73.03

## 2018-06-11 MED ORDER — GABAPENTIN 300 MG PO CAPS: 300.0000 mg | ORAL_CAPSULE | Freq: Three times a day (TID) | ORAL | 0 refills | Status: DC

## 2018-06-11 MED ORDER — MELOXICAM 15 MG PO TABS: 15.0000 mg | ORAL_TABLET | Freq: Every day | ORAL | 0 refills | Status: DC

## 2018-06-11 NOTE — Assessment & Plan Note (Signed)
Immunizations up-to-date.  Insurance will not cover Shingrix vaccination. PSA up-to-date. Colonoscopy up-to-date. Strongly advised to work on improving his diet and getting some form of regular exercise. Exam today stable. Labs reviewed. Follow-up in 1 year for CPE.
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
## 2018-06-11 NOTE — Assessment & Plan Note (Addendum)
Asymptomatic, no longer following with cardiology. Exam unremarkable.  Appears euvolemic.  Continue to monitor.
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
## 2018-06-11 NOTE — Patient Instructions (Addendum)
We've increased your gabapentin to 300 mg in the morning and continued 900 mg at night.  Start Meloxicam 15 mg once daily for pain and inflammation.  Use the Tramadol sparingly.   Start exercising. You should be getting 150 minutes of exercise weekly.  You must work to improve your diet: Limit soda, sugary drinks, fast food, fried food, peanut butter Increase vegetables, fruit, whole grains, lean protein, water.  Schedule a lab only appointment for 6 months to repeat your blood sugar levels.  Please update me in 2 weeks regarding the change in the gabapentin dose and Meloxicam medication.  It was a pleasure to see you today!  Prediabetes Eating Plan Prediabetes-also called impaired glucose tolerance or impaired fasting glucose-is a condition that causes blood sugar (blood glucose) levels to be higher than normal. Following a healthy diet can help to keep prediabetes under control. It can also help to lower the risk of type 2 diabetes and heart disease, which are increased in people who have prediabetes. Along with regular exercise, a healthy diet:  Promotes weight loss.  Helps to control blood sugar levels.  Helps to improve the way that the body uses insulin.  What do I need to know about this eating plan?  Use the glycemic index (GI) to plan your meals. The index tells you how quickly a food will raise your blood sugar. Choose low-GI foods. These foods take a longer time to raise blood sugar.  Pay close attention to the amount of carbohydrates in the food that you eat. Carbohydrates increase blood sugar levels.  Keep track of how many calories you take in. Eating the right amount of calories will help you to achieve a healthy weight. Losing about 7 percent of your starting weight can help to prevent type 2 diabetes.  You may want to follow a Mediterranean diet. This diet includes a lot of vegetables, lean meats or fish, whole grains, fruits, and healthy oils and fats. What foods  can I eat? Grains Whole grains, such as whole-wheat or whole-grain breads, crackers, cereals, and pasta. Unsweetened oatmeal. Bulgur. Barley. Quinoa. Brown rice. Corn or whole-wheat flour tortillas or taco shells. Vegetables Lettuce. Spinach. Peas. Beets. Cauliflower. Cabbage. Broccoli. Carrots. Tomatoes. Squash. Eggplant. Herbs. Peppers. Onions. Cucumbers. Brussels sprouts. Fruits Berries. Bananas. Apples. Oranges. Grapes. Papaya. Mango. Pomegranate. Kiwi. Grapefruit. Cherries. Meats and Other Protein Sources Seafood. Lean meats, such as chicken and [place] or lean cuts of pork and beef. Tofu. Eggs. Nuts. Beans. Dairy Low-fat or fat-free dairy products, such as yogurt, cottage cheese, and cheese. Beverages Water. Tea. Coffee. Sugar-free or diet soda. Seltzer water. Milk. Milk alternatives, such as soy or almond milk. Condiments Mustard. Relish. Low-fat, low-sugar ketchup. Low-fat, low-sugar barbecue sauce. Low-fat or fat-free mayonnaise. Sweets and Desserts Sugar-free or low-fat pudding. Sugar-free or low-fat ice cream and other frozen treats. Fats and Oils Avocado. Walnuts. Olive oil. The items listed above may not be a complete list of recommended foods or beverages. Contact your dietitian for more options. What foods are not recommended? Grains Refined white flour and flour products, such as bread, pasta, snack foods, and cereals. Beverages Sweetened drinks, such as sweet iced tea and soda. Sweets and Desserts Baked goods, such as cake, cupcakes, pastries, cookies, and cheesecake. The items listed above may not be a complete list of foods and beverages to avoid. Contact your dietitian for more information. This information is not intended to replace advice given to you by your health care provider. Make sure you discuss any  questions you have with your health care provider. Document Released: 11/22/2014 Document Revised: 12/14/2015 Document Reviewed: 08/03/2014 Elsevier Interactive  Patient Education  2017 [employer].

## 2018-06-11 NOTE — Assessment & Plan Note (Signed)
Chronic, located to multiple sites.  Treat with meloxicam 15 mg once daily.  Renal function unremarkable.

## 2018-06-11 NOTE — Assessment & Plan Note (Signed)
Stable in the office today.  Continue current regimen. 

## 2018-06-11 NOTE — Assessment & Plan Note (Signed)
A1c of 5.9 on recent labs.  Discussed important changes needed to current dietary regimen.  Handout provided.  Repeat A1c in 6 months.

## 2018-06-11 NOTE — Assessment & Plan Note (Signed)
Overall improved to right lower extremity, now more so to left lower extremity.  Suspect osteoarthritis.  Will be treating with meloxicam 15 mg once daily.

## 2018-06-11 NOTE — Assessment & Plan Note (Signed)
Using tamsulosin every other day on average. Asymptomatic. Recent PSA unremarkable.

## 2018-06-16 ENCOUNTER — Encounter: Payer: Medicare Other | Admitting: Primary Care

## 2018-06-22 DIAGNOSIS — Z1212 Encounter for screening for malignant neoplasm of rectum: Secondary | ICD-10-CM | POA: Diagnosis not present

## 2018-06-22 DIAGNOSIS — Z1211 Encounter for screening for malignant neoplasm of colon: Secondary | ICD-10-CM | POA: Diagnosis not present

## 2018-06-25 ENCOUNTER — Encounter: Payer: Self-pay | Admitting: Primary Care

## 2018-06-25 LAB — COLOGUARD: Cologuard: NEGATIVE

## 2018-06-26 ENCOUNTER — Telehealth: Payer: Self-pay | Admitting: Primary Care

## 2018-06-26 NOTE — Telephone Encounter (Signed)
Please notify patient that his Cologuard report was negative.  We will repeat this in 3 years. Report sent for scanning.

## 2018-06-29 NOTE — Telephone Encounter (Signed)
Sending letter with results and Kate Clark's comments for patient.  

## 2018-07-01 ENCOUNTER — Telehealth: Payer: Self-pay | Admitting: *Deleted

## 2018-07-01 DIAGNOSIS — N529 Male erectile dysfunction, unspecified: Secondary | ICD-10-CM

## 2018-07-01 DIAGNOSIS — G609 Hereditary and idiopathic neuropathy, unspecified: Secondary | ICD-10-CM

## 2018-07-01 MED ORDER — SILDENAFIL CITRATE 100 MG PO TABS: ORAL_TABLET | ORAL | 0 refills | Status: DC

## 2018-07-01 MED ORDER — GABAPENTIN 300 MG PO CAPS: 300.0000 mg | ORAL_CAPSULE | Freq: Three times a day (TID) | ORAL | 1 refills | Status: DC

## 2018-07-01 NOTE — Telephone Encounter (Signed)
Please thank patient for the update. Just for clarification, how much gabapentin is he currently taking?  Is he taking 300 mg in the morning and 900 mg at bedtime?  Please notify patient that I am happy to refill his sildenafil but needs some additional information as we have never discussed this.  I see that he was once on the 100 mg dose, how often does he take this medication?  Does he have trouble obtaining or maintaining an erection?  Would he prefer if I sent the generic 20 mg tablets as they may be cheaper?

## 2018-07-01 NOTE — Telephone Encounter (Signed)
Spoken and notified patient of Tawni Millers comments. Patient stated that yes, he take 300 mg in the morning and 900 mg at bedtime.   Patient stated that he would like the 100 mg. He take this once a week.  He has trouble obtaining and maintaining erection. He had used a GoodRx card for 100 mg before at [facility] so he plans use it again.

## 2018-07-01 NOTE — Telephone Encounter (Signed)
Noted.  Both prescription sent to pharmacy.

## 2018-07-01 NOTE — Telephone Encounter (Signed)
Spoke to pts and his wife, who states they were to contact office back with an update of pts meds. Pt had discussed increasing the gabapentin with [place]. He has had some improvement and has decided he is wanting to do so. He also indicates that the Meloxicam is working as well. Should gabapentin be increased, Rx can be sent to [facility].  He also states that he is wanting a refill of sildenafil, but I advised a that an OV may be required as his last Rx was 04/2017. pls advise

## 2018-07-06 ENCOUNTER — Other Ambulatory Visit: Payer: Self-pay | Admitting: Primary Care

## 2018-07-06 DIAGNOSIS — G609 Hereditary and idiopathic neuropathy, unspecified: Secondary | ICD-10-CM

## 2018-07-07 ENCOUNTER — Other Ambulatory Visit: Payer: Self-pay | Admitting: Cardiovascular Disease

## 2018-07-28 ENCOUNTER — Encounter: Payer: Medicare Other | Admitting: Primary Care

## 2018-07-29 ENCOUNTER — Encounter: Payer: Medicare Other | Admitting: Primary Care

## 2018-08-31 ENCOUNTER — Ambulatory Visit: Payer: Medicare Other | Admitting: Cardiovascular Disease

## 2018-08-31 ENCOUNTER — Other Ambulatory Visit: Payer: Self-pay | Admitting: Cardiovascular Disease

## 2018-09-04 ENCOUNTER — Ambulatory Visit (INDEPENDENT_AMBULATORY_CARE_PROVIDER_SITE_OTHER): Payer: Medicare Other | Admitting: Primary Care

## 2018-09-04 ENCOUNTER — Other Ambulatory Visit: Payer: Self-pay

## 2018-09-04 ENCOUNTER — Encounter: Payer: Self-pay | Admitting: Primary Care

## 2018-09-04 VITALS — BP 128/76 | HR 56 | Temp 98.0°F | Ht 71.0 in | Wt 290.5 lb

## 2018-09-04 DIAGNOSIS — R7303 Prediabetes: Secondary | ICD-10-CM | POA: Diagnosis not present

## 2018-09-04 DIAGNOSIS — Z1159 Encounter for screening for other viral diseases: Secondary | ICD-10-CM

## 2018-09-04 DIAGNOSIS — R5383 Other fatigue: Secondary | ICD-10-CM

## 2018-09-04 DIAGNOSIS — G8929 Other chronic pain: Secondary | ICD-10-CM

## 2018-09-04 DIAGNOSIS — I509 Heart failure, unspecified: Secondary | ICD-10-CM

## 2018-09-04 DIAGNOSIS — G629 Polyneuropathy, unspecified: Secondary | ICD-10-CM

## 2018-09-04 DIAGNOSIS — I10 Essential (primary) hypertension: Secondary | ICD-10-CM | POA: Diagnosis not present

## 2018-09-04 DIAGNOSIS — M79604 Pain in right leg: Secondary | ICD-10-CM

## 2018-09-04 DIAGNOSIS — G609 Hereditary and idiopathic neuropathy, unspecified: Secondary | ICD-10-CM

## 2018-09-04 DIAGNOSIS — M1990 Unspecified osteoarthritis, unspecified site: Secondary | ICD-10-CM

## 2018-09-04 HISTORY — DX: Other fatigue: R53.83

## 2018-09-04 LAB — CBC
HCT: 29.8 % — ABNORMAL LOW (ref 39.0–52.0)
Hemoglobin: 9.4 g/dL — ABNORMAL LOW (ref 13.0–17.0)
MCHC: 31.5 g/dL (ref 30.0–36.0)
MCV: 75.9 fl — ABNORMAL LOW (ref 78.0–100.0)
Platelets: 264 10*3/uL (ref 150.0–400.0)
RBC: 3.93 Mil/uL — ABNORMAL LOW (ref 4.22–5.81)
RDW: 15.6 % — AB (ref 11.5–15.5)
WBC: 6.4 10*3/uL (ref 4.0–10.5)

## 2018-09-04 LAB — BRAIN NATRIURETIC PEPTIDE: Pro B Natriuretic peptide (BNP): 218 pg/mL — ABNORMAL HIGH (ref 0.0–100.0)

## 2018-09-04 LAB — HEMOGLOBIN A1C: Hgb A1c MFr Bld: 5.8 % (ref 4.6–6.5)

## 2018-09-04 LAB — TSH: TSH: 0.9 u[IU]/mL (ref 0.35–4.50)

## 2018-09-04 MED ORDER — METOPROLOL SUCCINATE ER 50 MG PO TB24: 50.0000 mg | ORAL_TABLET | Freq: Every day | ORAL | 0 refills | Status: DC

## 2018-09-04 MED ORDER — HYDROCHLOROTHIAZIDE 12.5 MG PO CAPS: 12.5000 mg | ORAL_CAPSULE | Freq: Every day | ORAL | 0 refills | Status: DC

## 2018-09-04 MED ORDER — GABAPENTIN 300 MG PO CAPS: ORAL_CAPSULE | ORAL | 1 refills | Status: DC

## 2018-09-04 NOTE — Assessment & Plan Note (Signed)
Chronic, but seems to have progressed. Labs pending today. Recommended he work on regular activity and a healthy diet.

## 2018-09-04 NOTE — Assessment & Plan Note (Signed)
Chronic, improved last night with initiation of amitriptyline. We will continue gabapentin, meloxicam, amitriptyline. Recommend we avoid narcotics. Consider pain management if symptoms persist.

## 2018-09-04 NOTE — Assessment & Plan Note (Signed)
Chronic, improved last night with addition of amitriptyline. Continue amitriptyline, gabapentin, meloxicam.

## 2018-09-04 NOTE — Assessment & Plan Note (Signed)
Recent symptoms could be secondary to mild CHF exacerbation, although he appears stable today. Labs pending today. Discussed to start weighing himself every morning with the same clothing, reporting a weight gain of greater than 2 pounds in 24 hours or greater than 5 pounds in 1 week. Follow-up with cardiology as scheduled.

## 2018-09-04 NOTE — Addendum Note (Signed)
Addended by: Ellamae Sia on: 09/04/2018 02:26 PM   Modules accepted: Orders

## 2018-09-04 NOTE — Assessment & Plan Note (Signed)
Repeat A1c pending. Weight gain of nearly 10 pounds since last visit, he does endorse eating a lot of junk food.

## 2018-09-04 NOTE — Assessment & Plan Note (Signed)
Stable in the office today.  Refill provided for hydrochlorothiazide until he can see his cardiologist.  Continue lisinopril.

## 2018-09-04 NOTE — Assessment & Plan Note (Signed)
Chronic, seems to be increased which could be from lower extremity swelling. We will have him trial gabapentin 600 mg 3 times daily, increasing by 1 pill daily if needed.

## 2018-09-04 NOTE — Patient Instructions (Addendum)
Start by taking 600 mg of your gabapentin three times daily. If needed you can increase to another pill at anytime.  Resume amitriptyline, start by taking 1/2 tablet daily for one week, then increase thereafter if needed.  I sent a refill of your hydrochlorothiazide to the pharmacy.  Start weighing yourself every morning with the same clothing. Record your weights and bring them to your visit with the cardiologist. Call him or me if you see a weight gain of greater than 2 pounds in 24 hours or greater than 5 pounds in one week.  Follow up with your cardiologist as scheduled.  It was a pleasure to see you today!

## 2018-09-04 NOTE — Progress Notes (Signed)
Subjective:    Patient ID: Timothy Sheppard, male    DOB: [birth year], 73 y.o.   MRN: [id number]  HPI  Mr. Mercer is a 73 year old male with a history of CHF, chronic pain, osteoarthritis, hypertension, neuropathy, BPH, lower back surgery, femur fracture who presents today with multiple complaints.  1) Cough: He also reports chest congestion, sore throat, fatigue. His symptoms began two days ago. He denies fevers. He's been around numerous people at church who have had the same symptoms. He's taken Tylenol and his usual medications without much improvement.  Overall he feels well.  2) Chronic Pain: Chronic pain to his lower extremities from knees down to his feet. Also with right hip pain that will radiate down to his right foot; pain to his bilateral feet that feels like "blocks of ice". He is more sluggish when getting up to walk. He is not exercising but is walking around [employer] 3-4 days weekly and active at home.   He is taking gabapentin 900 mg twice daily, Meloxicam 15 mg daily, applying icy hot with lidocaine. He was once on amitriptyline, no recent use until last night when he resumed.  He did notice improvement once he resumed amitriptyline.  3) Essential Hypertension/CHF: Currently managed on HCTZ 12.5 mg, lisinopril 20 mg, and metoprolol succinate 50 mg. He's not had his metoprolol succinate in months as it caused him to feel dizzy. He ran out of his HCTZ yesterday and doesn't have refills. He has an appointment scheduled with cardiology for February 27th.   He has noticed additional swelling to his lower extremities, also fatigue and some shortness of breath. He denies chest pain and recurrent cough. He is not weighing himself daily.  He finds that he is craving ice and has done so for years.  BP Readings from Last 3 Encounters:  09/04/18 128/76  06/11/18 120/70  06/08/18 128/70   Wt Readings from Last 3 Encounters:  09/04/18 290 lb 8 oz (131.8 kg)  06/11/18 281 lb 12 oz  (127.8 kg)  06/08/18 278 lb 12 oz (126.4 kg)     Review of Systems  Constitutional: Positive for fatigue. Negative for chills and fever.  HENT: Negative for congestion, postnasal drip and sore throat.   Respiratory: Positive for shortness of breath.   Cardiovascular: Positive for leg swelling. Negative for chest pain.  Musculoskeletal: Positive for arthralgias.  Neurological: Positive for numbness.       Past Medical History:  Diagnosis Date  . CHF (congestive heart failure) ([place])   . Chronic hip pain    Right  . Erectile dysfunction   . Hyperlipidemia   . Hypertension   . Iritis   . Neuropathy      Social History   Socioeconomic History  . Marital status: Married    Spouse name: Not on file  . Number of children: Not on file  . Years of education: Not on file  . Highest education level: Not on file  Occupational History  . Not on file  Social Needs  . Financial resource strain: Not on file  . Food insecurity:    Worry: Not on file    Inability: Not on file  . Transportation needs:    Medical: Not on file    Non-medical: Not on file  Tobacco Use  . Smoking status: Former Smoker    Types: Pipe  . Smokeless tobacco: Never Used  . Tobacco comment: Quit 1 mo ago  Substance and Sexual Activity  .  Alcohol use: No  . Drug use: No  . Sexual activity: Not on file  Lifestyle  . Physical activity:    Days per week: Not on file    Minutes per session: Not on file  . Stress: Not on file  Relationships  . Social connections:    Talks on phone: Not on file    Gets together: Not on file    Attends religious service: Not on file    Active member of club or organization: Not on file    Attends meetings of clubs or organizations: Not on file    Relationship status: Not on file  . Intimate partner violence:    Fear of current or ex partner: Not on file    Emotionally abused: Not on file    Physically abused: Not on file    Forced sexual activity: Not on file  Other  Topics Concern  . Not on file  Social History Narrative   Married.   2 children, 2 grandchildren, 2 great grandchilden.   Retired.     Past Surgical History:  Procedure Laterality Date  . BACK SURGERY  2000  . HERNIA REPAIR    . ORIF FEMUR FRACTURE Right 1967   rod insertion  . TONSILLECTOMY AND ADENOIDECTOMY      Family History  Problem Relation Age of Onset  . Heart failure Mother   . Lupus Mother   . Heart failure Father     No Known Allergies  Current Outpatient Medications on File Prior to Visit  Medication Sig Dispense Refill  . amitriptyline (ELAVIL) 25 MG tablet Take 25 mg by mouth at bedtime.    Marland Kitchen aspirin 81 MG tablet Take 81 mg by mouth daily.    Marland Kitchen lisinopril (PRINIVIL,ZESTRIL) 20 MG tablet Take 1 tablet (20 mg total) by mouth daily. Please keep upcoming appt in February for future refills. Thank you 90 tablet 0  . meloxicam (MOBIC) 15 MG tablet Take 1 tablet (15 mg total) by mouth daily. For pain. 90 tablet 0  . sildenafil (VIAGRA) 100 MG tablet Take 1 tablet by mouth 30 minutes prior to intercourse. 30 tablet 0  . tamsulosin (FLOMAX) 0.4 MG CAPS capsule TAKE 1 CAPSULE BY MOUTH EVERY MORNING FOR URINE STREAM. 90 capsule 0  . metoprolol succinate (TOPROL-XL) 50 MG 24 hr tablet Take 1 tablet (50 mg total) by mouth daily. Take with or immediately following a meal. (Patient not taking: Reported on 09/04/2018) 90 tablet 3   No current facility-administered medications on file prior to visit.     BP 128/76 (BP Location: Right Arm, Patient Position: Sitting, Cuff Size: Large)   Pulse (!) 56   Temp 98 F (36.7 C) (Oral)   Ht 5\' 11"  (1.803 m)   Wt 290 lb 8 oz (131.8 kg)   SpO2 98%   BMI 40.52 kg/m    Objective:   Physical Exam  Constitutional: He appears well-nourished.  Neck: Neck supple.  Cardiovascular: Normal rate and regular rhythm.  Mild bilateral lower extremity edema with trace pitting.  Respiratory: Effort normal and breath sounds normal.  Skin: Skin  is warm and dry.           Assessment & Plan:

## 2018-09-05 LAB — HEPATITIS C ANTIBODY
Hepatitis C Ab: NONREACTIVE
SIGNAL TO CUT-OFF: 0.03 (ref <1.00)

## 2018-09-08 ENCOUNTER — Other Ambulatory Visit: Payer: Self-pay | Admitting: Primary Care

## 2018-09-08 DIAGNOSIS — D649 Anemia, unspecified: Secondary | ICD-10-CM

## 2018-09-08 MED ORDER — OMEPRAZOLE 20 MG PO CPDR: 20.0000 mg | DELAYED_RELEASE_CAPSULE | Freq: Every day | ORAL | 0 refills | Status: DC

## 2018-09-09 NOTE — Progress Notes (Deleted)
CARDIOLOGY OFFICE NOTE  Date:  09/09/2018    Timothy Sheppard Date of Birth: September 03, 1945 Medical Record #[id number]  PCP:  [name], NP  Cardiologist:  Johnsie Cancel    No chief complaint on file.   History of Present Illness: Timothy Sheppard is a 73 y.o. male who presents for f/u HTN and atypical chest pain   Normal Myoview December of 2015. Remote echo from 2014 with EF of 50 to 55%.   Seen by NP September 2018   Has had significant anemia on iron Rx.  Hct 29.8 Neuropathy RLE and arthritis limit his activity Weight is up And eating a lot of junk food Primary seen 2/14 concerned about CHF but BNP only 218  ***  Past Medical History:  Diagnosis Date  . CHF (congestive heart failure) ([place])   . Chronic hip pain    Right  . Erectile dysfunction   . Hyperlipidemia   . Hypertension   . Iritis   . Neuropathy     Past Surgical History:  Procedure Laterality Date  . BACK SURGERY  2000  . HERNIA REPAIR    . ORIF FEMUR FRACTURE Right 1967   rod insertion  . TONSILLECTOMY AND ADENOIDECTOMY       Medications: No outpatient medications have been marked as taking for the 09/17/18 encounter (Appointment) with Josue Hector, MD.     Allergies: No Known Allergies  Social History: The patient  reports that he has quit smoking. His smoking use included pipe. He has never used smokeless tobacco. He reports that he does not drink alcohol or use drugs.   Family History: The patient's family history includes Heart failure in his father and mother; Lupus in his mother.   Review of Systems: Please see the history of present illness.   Otherwise, the review of systems is positive for none.   All other systems are reviewed and negative.   Physical Exam: VS:  There were no vitals taken for this visit. Marland Kitchen  BMI There is no height or weight on file to calculate BMI.  Wt Readings from Last 3 Encounters:  09/04/18 131.8 kg  06/11/18 127.8 kg  06/08/18 126.4 kg    Affect appropriate Overweight white male  HEENT: normal Neck supple with no adenopathy JVP normal no bruits no thyromegaly Lungs clear with no wheezing and good diaphragmatic motion Heart:  S1/S2 no murmur, no rub, gallop or click PMI normal Abdomen: benighn, BS positve, no tenderness, no AAA no bruit.  No HSM or HJR Distal pulses intact with no bruits No edema Neuro non-focal Skin warm and dry No muscular weakness   LABORATORY DATA:  EKG:  04/07/17  sinus bradycardia.otherwise normal   Lab Results  Component Value Date   WBC 6.4 09/04/2018   HGB 9.4 (L) 09/04/2018   HCT 29.8 (L) 09/04/2018   PLT 264.0 09/04/2018   GLUCOSE 103 (H) 06/08/2018   CHOL 152 06/08/2018   TRIG 118.0 06/08/2018   HDL 47.20 06/08/2018   LDLCALC 81 06/08/2018   ALT 10 06/08/2018   AST 15 06/08/2018   NA 136 06/08/2018   K 4.5 06/08/2018   CL 101 06/08/2018   CREATININE 1.10 06/08/2018   BUN 17 06/08/2018   CO2 29 06/08/2018   TSH 0.90 09/04/2018   PSA 0.20 06/08/2018   HGBA1C 5.8 09/04/2018     BNP (last 3 results) No results for input(s): BNP in the last 8760 hours.  ProBNP (last 3 results)  Recent Labs    09/04/18 1426  PROBNP 218.0*     Other Studies Reviewed Today:  Myoview Impression 2015 Exercise Capacity:  Lexiscan with low level exercise. BP Response:  Normal blood pressure response. Clinical Symptoms:  No significant symptoms noted. ECG Impression:  No significant ST segment change suggestive of ischemia. Comparison with Prior Nuclear Study: No images to compare.  By report, the EF has improved since the previous study in 2001.   Overall Impression:  Normal stress nuclear study.  LV Ejection Fraction: 51%.  LV Wall Motion:  NL LV Function; NL Wall Motion .   Thayer Headings, Brooke Bonito., MD, [facility] 07/18/2014, 5:01 PM [address]  [place] Pager [phone]  Echo Study Conclusions 2014  - Left ventricle: The cavity size was  normal. Wall thickness was increased in a pattern of moderate LVH. Systolic function was normal. The estimated ejection fraction was in the range of 50% to 55%. Wall motion was normal; there were no regional wall motion abnormalities. - Mitral valve: Calcified annulus. Mildly thickened leaflets . Mild regurgitation. - Left atrium: The atrium was mildly dilated.   Assessment/Plan: 1. HTN - Well controlled.  Continue current medications and low sodium Dash type diet.    2. Atypical chest pain - has not recurred Normal myovue 07/19/14   3. Anemia:  Improved with FeSo4 Hct 29.4 09/04/18   4. HLD -  At goal  Lab Results  Component Value Date   LDLCALC 81 06/08/2018   5. Weight Gain:  And mildly elevated BNP suspect just too many calories will order TTE to make Sure EF not worse and assess diastolic parameters with history of HTN.  Continue HCTZ   Jenkins Rouge

## 2018-09-17 ENCOUNTER — Ambulatory Visit: Payer: Medicare Other | Admitting: Cardiovascular Disease

## 2018-09-25 ENCOUNTER — Other Ambulatory Visit (INDEPENDENT_AMBULATORY_CARE_PROVIDER_SITE_OTHER): Payer: Medicare Other

## 2018-09-25 DIAGNOSIS — D649 Anemia, unspecified: Secondary | ICD-10-CM | POA: Diagnosis not present

## 2018-09-25 LAB — CBC
HEMATOCRIT: 38.6 % — AB (ref 39.0–52.0)
Hemoglobin: 12.5 g/dL — ABNORMAL LOW (ref 13.0–17.0)
MCHC: 32.3 g/dL (ref 30.0–36.0)
MCV: 81.6 fl (ref 78.0–100.0)
Platelets: 293 10*3/uL (ref 150.0–400.0)
RBC: 4.74 Mil/uL (ref 4.22–5.81)
RDW: 24.8 % — ABNORMAL HIGH (ref 11.5–15.5)
WBC: 7.4 10*3/uL (ref 4.0–10.5)

## 2018-09-25 LAB — IBC + FERRITIN
Ferritin: 46.3 ng/mL (ref 22.0–322.0)
Iron: 31 ug/dL — ABNORMAL LOW (ref 42–165)
Saturation Ratios: 9.1 % — ABNORMAL LOW (ref 20.0–50.0)
Transferrin: 243 mg/dL (ref 212.0–360.0)

## 2018-09-26 ENCOUNTER — Other Ambulatory Visit: Payer: Self-pay | Admitting: Cardiovascular Disease

## 2018-09-26 DIAGNOSIS — I10 Essential (primary) hypertension: Secondary | ICD-10-CM

## 2018-09-30 ENCOUNTER — Other Ambulatory Visit: Payer: Self-pay | Admitting: Primary Care

## 2018-09-30 DIAGNOSIS — R3912 Poor urinary stream: Principal | ICD-10-CM

## 2018-09-30 DIAGNOSIS — N401 Enlarged prostate with lower urinary tract symptoms: Secondary | ICD-10-CM

## 2018-10-04 NOTE — Progress Notes (Signed)
CARDIOLOGY OFFICE NOTE  Date:  10/06/2018    Timothy Sheppard Date of Birth: 06/25/1946 Medical Record #[id number]  PCP:  [name], NP  Cardiologist:  Johnsie Cancel    No chief complaint on file.   History of Present Illness: Timothy Sheppard is a 73 y.o. male who presents for f/u HTN and atypical chest pain   Normal Myoview December of 2015. Remote echo from 2014 with EF of 50 to 55%.   Seen by NP September 2018   Has had significant anemia on iron Rx.  Hct 29.8 Neuropathy RLE and arthritis limit his activity Weight is up And eating a lot of junk food Primary seen 2/14 concerned about CHF but BNP only 218  Had some SSCP 2 weeks ago Took all his meds at once not exertional resolved spontaneously after about an hour Activity limited by bad rod in right femur   Past Medical History:  Diagnosis Date  . CHF (congestive heart failure) ([place])   . Chronic hip pain    Right  . Erectile dysfunction   . Hyperlipidemia   . Hypertension   . Iritis   . Neuropathy     Past Surgical History:  Procedure Laterality Date  . BACK SURGERY  2000  . HERNIA REPAIR    . ORIF FEMUR FRACTURE Right 1967   rod insertion  . TONSILLECTOMY AND ADENOIDECTOMY       Medications: Current Meds  Medication Sig  . amitriptyline (ELAVIL) 25 MG tablet Take 25 mg by mouth at bedtime.  Marland Kitchen aspirin 81 MG tablet Take 81 mg by mouth daily.  Marland Kitchen gabapentin (NEURONTIN) 300 MG capsule Take 2-3 capsules by mouth three times daily.  . hydrochlorothiazide (MICROZIDE) 12.5 MG capsule TAKE 1 CAPSULE (12.5 MG TOTAL) BY MOUTH DAILY.  Marland Kitchen lisinopril (PRINIVIL,ZESTRIL) 20 MG tablet Take 1 tablet (20 mg total) by mouth daily.  . metoprolol succinate (TOPROL-XL) 50 MG 24 hr tablet Take 1 tablet (50 mg total) by mouth daily.  . sildenafil (VIAGRA) 100 MG tablet Take 1 tablet by mouth 30 minutes prior to intercourse.  . tamsulosin (FLOMAX) 0.4 MG CAPS capsule TAKE 1 CAPSULE BY MOUTH ONCE DAILY IN THE MORNING FOR   URINE  STREAM  . [DISCONTINUED] hydrochlorothiazide (MICROZIDE) 12.5 MG capsule TAKE 1 CAPSULE (12.5 MG TOTAL) BY MOUTH DAILY. PLEASE MAKE APPT WITH DR. Johnsie Cancel FOR MORE REFILLS  . [DISCONTINUED] lisinopril (PRINIVIL,ZESTRIL) 20 MG tablet Take 1 tablet (20 mg total) by mouth daily. Please keep upcoming appt in February for future refills. Thank you  . [DISCONTINUED] metoprolol succinate (TOPROL-XL) 50 MG 24 hr tablet Take 1 tablet (50 mg total) by mouth daily. Take with or immediately following a meal. Please keep upcoming appt for future refills. Thank you     Allergies: No Known Allergies  Social History: The patient  reports that he has quit smoking. His smoking use included pipe. He has never used smokeless tobacco. He reports that he does not drink alcohol or use drugs.   Family History: The patient's family history includes Heart failure in his father and mother; Lupus in his mother.   Review of Systems: Please see the history of present illness.   Otherwise, the review of systems is positive for none.   All other systems are reviewed and negative.   Physical Exam: VS:  BP 130/80   Pulse (!) 49   Ht 5\' 11"  (1.803 m)   Wt 124 kg   SpO2 96%  BMI 38.13 kg/m  .  BMI Body mass index is 38.13 kg/m.  Wt Readings from Last 3 Encounters:  10/06/18 124 kg  09/04/18 131.8 kg  06/11/18 127.8 kg   Affect appropriate Overweight white male  HEENT: normal Neck supple with no adenopathy JVP normal no bruits no thyromegaly Lungs clear with no wheezing and good diaphragmatic motion Heart:  S1/S2 no murmur, no rub, gallop or click PMI normal Abdomen: benighn, BS positve, no tenderness, no AAA no bruit.  No HSM or HJR Distal pulses intact with no bruits No edema Neuro non-focal Skin warm and dry No muscular weakness   LABORATORY DATA:  EKG:  04/07/17  sinus bradycardia.otherwise normal   Lab Results  Component Value Date   WBC 7.4 09/25/2018   HGB 12.5 (L) 09/25/2018   HCT  38.6 (L) 09/25/2018   PLT 293.0 09/25/2018   GLUCOSE 103 (H) 06/08/2018   CHOL 152 06/08/2018   TRIG 118.0 06/08/2018   HDL 47.20 06/08/2018   LDLCALC 81 06/08/2018   ALT 10 06/08/2018   AST 15 06/08/2018   NA 136 06/08/2018   K 4.5 06/08/2018   CL 101 06/08/2018   CREATININE 1.10 06/08/2018   BUN 17 06/08/2018   CO2 29 06/08/2018   TSH 0.90 09/04/2018   PSA 0.20 06/08/2018   HGBA1C 5.8 09/04/2018     BNP (last 3 results) No results for input(s): BNP in the last 8760 hours.  ProBNP (last 3 results) Recent Labs    09/04/18 1426  PROBNP 218.0*     Other Studies Reviewed Today:  Myoview Impression 2015 Exercise Capacity:  Lexiscan with low level exercise. BP Response:  Normal blood pressure response. Clinical Symptoms:  No significant symptoms noted. ECG Impression:  No significant ST segment change suggestive of ischemia. Comparison with Prior Nuclear Study: No images to compare.  By report, the EF has improved since the previous study in 2001.   Overall Impression:  Normal stress nuclear study.  LV Ejection Fraction: 51%.  LV Wall Motion:  NL LV Function; NL Wall Motion .   Thayer Headings, Brooke Bonito., MD, [facility] 07/18/2014, 5:01 PM [address]  [place] Pager [phone]  Echo Study Conclusions 2014  - Left ventricle: The cavity size was normal. Wall thickness was increased in a pattern of moderate LVH. Systolic function was normal. The estimated ejection fraction was in the range of 50% to 55%. Wall motion was normal; there were no regional wall motion abnormalities. - Mitral valve: Calcified annulus. Mildly thickened leaflets . Mild regurgitation. - Left atrium: The atrium was mildly dilated.   Assessment/Plan: 1. HTN - Well controlled.  Continue current medications and low sodium Dash type diet.    2. Atypical chest pain - Normal myovue 07/19/14 will update Has bad femur Rod in right leg so will need  lexiscan  3. Anemia:  Improved with FeSo4 Hct 29.4 09/04/18   4. HLD -  At goal  Lab Results  Component Value Date   LDLCALC 81 06/08/2018   5. Weight Gain:  BNP minimally elevated This is not CHF discussed better diet Low sodium compression stockings. He has bad varicose veins in both legs which causes his edema. May need lasix rather than HCTZ f/u with primary will have more updated EF with myovue   Jenkins Rouge

## 2018-10-06 ENCOUNTER — Other Ambulatory Visit: Payer: Self-pay

## 2018-10-06 ENCOUNTER — Ambulatory Visit (INDEPENDENT_AMBULATORY_CARE_PROVIDER_SITE_OTHER): Payer: Medicare Other | Admitting: Cardiovascular Disease

## 2018-10-06 ENCOUNTER — Encounter: Payer: Self-pay | Admitting: Cardiovascular Disease

## 2018-10-06 VITALS — BP 130/80 | HR 49 | Ht 71.0 in | Wt 273.4 lb

## 2018-10-06 DIAGNOSIS — R0602 Shortness of breath: Secondary | ICD-10-CM

## 2018-10-06 DIAGNOSIS — R0789 Other chest pain: Secondary | ICD-10-CM | POA: Diagnosis not present

## 2018-10-06 DIAGNOSIS — I10 Essential (primary) hypertension: Secondary | ICD-10-CM

## 2018-10-06 DIAGNOSIS — R079 Chest pain, unspecified: Secondary | ICD-10-CM

## 2018-10-06 MED ORDER — HYDROCHLOROTHIAZIDE 12.5 MG PO CAPS: ORAL_CAPSULE | ORAL | 3 refills | Status: DC

## 2018-10-06 MED ORDER — LISINOPRIL 20 MG PO TABS: 20.0000 mg | ORAL_TABLET | Freq: Every day | ORAL | 3 refills | Status: DC

## 2018-10-06 MED ORDER — METOPROLOL SUCCINATE ER 50 MG PO TB24: 50.0000 mg | ORAL_TABLET | Freq: Every day | ORAL | 3 refills | Status: DC

## 2018-10-06 NOTE — Patient Instructions (Addendum)
Medication Instructions:  If you need a refill on your cardiac medications before your next appointment, please call your pharmacy.   Lab work:  If you have labs (blood work) drawn today and your tests are completely normal, you will receive your results only by: . MyChart Message (if you have MyChart) OR . A paper copy in the mail If you have any lab test that is abnormal or we need to change your treatment, we will call you to review the results.  Testing/Procedures: Your physician has requested that you have a lexiscan myoview. For further information please visit [email or web address]. Please follow instruction sheet, as given.  Follow-Up: At [facility], you and your health needs are our priority.  As part of our continuing mission to provide you with exceptional heart care, we have created designated Provider Care Teams.  These Care Teams include your primary Cardiologist (physician) and Advanced Practice Providers (APPs -  Physician Assistants and Nurse Practitioners) who all work together to provide you with the care you need, when you need it. You will need a follow up appointment in 12 months.  Please call our office 2 months in advance to schedule this appointment.  You may see Dr. Nishan or one of the following Advanced Practice Providers on your designated Care Team:   Lori Gerhardt, NP Laura Ingold, NP . Jill McDaniel, NP    

## 2018-10-07 ENCOUNTER — Other Ambulatory Visit: Payer: Self-pay | Admitting: Cardiovascular Disease

## 2018-10-07 ENCOUNTER — Other Ambulatory Visit: Payer: Self-pay

## 2018-10-07 MED ORDER — HYDROCHLOROTHIAZIDE 25 MG PO TABS: 25.0000 mg | ORAL_TABLET | Freq: Every day | ORAL | 3 refills | Status: DC

## 2018-10-07 NOTE — Telephone Encounter (Signed)
Informed pt of HCTZ increase to 25mg  per Dr. Johnsie Cancel Informed pt to call back with any symptom changes  Pt verbalized understanding

## 2018-10-07 NOTE — Addendum Note (Signed)
Addended by: De Burrs on: 10/07/2018 03:32 PM   Modules accepted: Orders

## 2018-10-07 NOTE — Telephone Encounter (Signed)
New Message:    Pt said he thought Dr Johnsie Cancel wanted him to increase his Hydrochlorothiazide. If so, please call in a new prescription for this to [facility] [phone].

## 2018-10-07 NOTE — Addendum Note (Signed)
Addended by: Cheral Marker R on: 10/07/2018 03:27 PM   Modules accepted: Orders

## 2018-10-07 NOTE — Telephone Encounter (Signed)
Pt called in stating that his HCTZ was supposed to be increased from his visit yesterday. The same prescription was called in to the pharmacy and he would like clarification. He would like a call back on [phone]. Please address. Thank you

## 2018-10-07 NOTE — Telephone Encounter (Signed)
Patient should have HCTZ 25 mg daily called in for now he can take HCTZ 12.5 bid  Needs BMET and BNP in 3 weeks

## 2018-10-07 NOTE — Telephone Encounter (Signed)
Seen my previous note He was taking HCTZ 12.5 daily Wanted to increase to 25 mg daily and if this didn't help Change to lasix 20 mg daily after 3 weeks. Of trying higher dose HCTZ. He has 12.5 mg tablets of HCTZ so call in The 25 mg dose and have him take his 12.5 mg bid till he needs to fill larger dose

## 2018-10-07 NOTE — Telephone Encounter (Signed)
LMTCB

## 2018-10-08 NOTE — Telephone Encounter (Signed)
See previous note

## 2018-12-23 ENCOUNTER — Other Ambulatory Visit: Payer: Self-pay | Admitting: Primary Care

## 2018-12-23 DIAGNOSIS — G609 Hereditary and idiopathic neuropathy, unspecified: Secondary | ICD-10-CM

## 2019-02-08 ENCOUNTER — Encounter (HOSPITAL_COMMUNITY): Payer: Medicare Other

## 2019-02-22 ENCOUNTER — Other Ambulatory Visit: Payer: Self-pay | Admitting: Primary Care

## 2019-02-22 DIAGNOSIS — G609 Hereditary and idiopathic neuropathy, unspecified: Secondary | ICD-10-CM

## 2019-05-13 DIAGNOSIS — C44311 Basal cell carcinoma of skin of nose: Secondary | ICD-10-CM | POA: Diagnosis not present

## 2019-05-13 DIAGNOSIS — L578 Other skin changes due to chronic exposure to nonionizing radiation: Secondary | ICD-10-CM | POA: Diagnosis not present

## 2019-05-13 DIAGNOSIS — C4491 Basal cell carcinoma of skin, unspecified: Secondary | ICD-10-CM

## 2019-05-13 DIAGNOSIS — D485 Neoplasm of uncertain behavior of skin: Secondary | ICD-10-CM | POA: Diagnosis not present

## 2019-05-13 HISTORY — DX: Basal cell carcinoma of skin, unspecified: C44.91

## 2019-05-30 ENCOUNTER — Other Ambulatory Visit: Payer: Self-pay | Admitting: Primary Care

## 2019-05-30 DIAGNOSIS — N401 Enlarged prostate with lower urinary tract symptoms: Secondary | ICD-10-CM

## 2019-06-13 ENCOUNTER — Other Ambulatory Visit: Payer: Self-pay | Admitting: Primary Care

## 2019-06-13 DIAGNOSIS — I10 Essential (primary) hypertension: Secondary | ICD-10-CM

## 2019-06-13 DIAGNOSIS — R7303 Prediabetes: Secondary | ICD-10-CM

## 2019-06-13 DIAGNOSIS — D509 Iron deficiency anemia, unspecified: Secondary | ICD-10-CM

## 2019-06-14 ENCOUNTER — Other Ambulatory Visit: Payer: Self-pay

## 2019-06-14 ENCOUNTER — Ambulatory Visit: Payer: Medicare Other

## 2019-06-14 ENCOUNTER — Other Ambulatory Visit (INDEPENDENT_AMBULATORY_CARE_PROVIDER_SITE_OTHER): Payer: Medicare Other

## 2019-06-14 DIAGNOSIS — D509 Iron deficiency anemia, unspecified: Secondary | ICD-10-CM | POA: Diagnosis not present

## 2019-06-14 DIAGNOSIS — R7303 Prediabetes: Secondary | ICD-10-CM

## 2019-06-14 DIAGNOSIS — I10 Essential (primary) hypertension: Secondary | ICD-10-CM

## 2019-06-14 LAB — COMPREHENSIVE METABOLIC PANEL
ALT: 10 U/L (ref 0–53)
AST: 16 U/L (ref 0–37)
Albumin: 3.8 g/dL (ref 3.5–5.2)
Alkaline Phosphatase: 50 U/L (ref 39–117)
BUN: 22 mg/dL (ref 6–23)
CO2: 29 mEq/L (ref 19–32)
Calcium: 9.8 mg/dL (ref 8.4–10.5)
Chloride: 101 mEq/L (ref 96–112)
Creatinine, Ser: 1.11 mg/dL (ref 0.40–1.50)
GFR: 65.24 mL/min (ref >60.00)
Glucose, Bld: 151 mg/dL — ABNORMAL HIGH (ref 70–99)
Potassium: 4.1 mEq/L (ref 3.5–5.1)
Sodium: 139 mEq/L (ref 135–145)
Total Bilirubin: 0.6 mg/dL (ref 0.2–1.2)
Total Protein: 6.7 g/dL (ref 6.0–8.3)

## 2019-06-14 LAB — IBC + FERRITIN
Ferritin: 52.7 ng/mL (ref 22.0–322.0)
Iron: 84 ug/dL (ref 42–165)
Saturation Ratios: 29.6 % (ref 20.0–50.0)
Transferrin: 203 mg/dL — ABNORMAL LOW (ref 212.0–360.0)

## 2019-06-14 LAB — CBC
HCT: 41 % (ref 39.0–52.0)
Hemoglobin: 14 g/dL (ref 13.0–17.0)
MCHC: 34.2 g/dL (ref 30.0–36.0)
MCV: 93.9 fl (ref 78.0–100.0)
Platelets: 217 10*3/uL (ref 150.0–400.0)
RBC: 4.37 Mil/uL (ref 4.22–5.81)
RDW: 13.8 % (ref 11.5–15.5)
WBC: 7.5 10*3/uL (ref 4.0–10.5)

## 2019-06-14 LAB — LIPID PANEL
Cholesterol: 148 mg/dL (ref 0–200)
HDL: 47.1 mg/dL (ref >39.00)
LDL Cholesterol: 88 mg/dL (ref 0–99)
NonHDL: 101.14
Total CHOL/HDL Ratio: 3
Triglycerides: 67 mg/dL (ref 0.0–149.0)
VLDL: 13.4 mg/dL (ref 0.0–40.0)

## 2019-06-14 LAB — HEMOGLOBIN A1C: Hgb A1c MFr Bld: 5.6 % (ref 4.6–6.5)

## 2019-06-21 ENCOUNTER — Encounter: Payer: Self-pay | Admitting: Primary Care

## 2019-06-21 ENCOUNTER — Ambulatory Visit: Payer: Medicare Other

## 2019-06-21 ENCOUNTER — Other Ambulatory Visit: Payer: Self-pay

## 2019-06-21 ENCOUNTER — Ambulatory Visit (INDEPENDENT_AMBULATORY_CARE_PROVIDER_SITE_OTHER): Payer: Medicare Other | Admitting: Primary Care

## 2019-06-21 VITALS — BP 124/78 | HR 60 | Temp 97.8°F | Ht 71.0 in | Wt 272.5 lb

## 2019-06-21 DIAGNOSIS — I509 Heart failure, unspecified: Secondary | ICD-10-CM | POA: Diagnosis not present

## 2019-06-21 DIAGNOSIS — Z23 Encounter for immunization: Secondary | ICD-10-CM

## 2019-06-21 DIAGNOSIS — M1990 Unspecified osteoarthritis, unspecified site: Secondary | ICD-10-CM | POA: Diagnosis not present

## 2019-06-21 DIAGNOSIS — Z0000 Encounter for general adult medical examination without abnormal findings: Secondary | ICD-10-CM

## 2019-06-21 DIAGNOSIS — I10 Essential (primary) hypertension: Secondary | ICD-10-CM

## 2019-06-21 DIAGNOSIS — R3912 Poor urinary stream: Secondary | ICD-10-CM

## 2019-06-21 DIAGNOSIS — N529 Male erectile dysfunction, unspecified: Secondary | ICD-10-CM

## 2019-06-21 DIAGNOSIS — G629 Polyneuropathy, unspecified: Secondary | ICD-10-CM | POA: Diagnosis not present

## 2019-06-21 DIAGNOSIS — N401 Enlarged prostate with lower urinary tract symptoms: Secondary | ICD-10-CM

## 2019-06-21 DIAGNOSIS — R7303 Prediabetes: Secondary | ICD-10-CM

## 2019-06-21 MED ORDER — ZOSTER VAC RECOMB ADJUVANTED 50 MCG/0.5ML IM SUSR: 0.5000 mL | Freq: Once | INTRAMUSCULAR | 1 refills | Status: AC

## 2019-06-21 NOTE — Assessment & Plan Note (Addendum)
Chronic and only bothersome at night. Will have him move his AM dose to PM for a total of 1200 mg HS. Offered to change to 600 mg dose, he would like to continue with 300 mg due to pricing.  Continue PRN amitriptyline.

## 2019-06-21 NOTE — Patient Instructions (Signed)
You can take Ibuprofen 800 mg twice daily as needed. Do not exceed 2400 mg in 24 hours.  You can take Tylenol as needed for pain. Do not exceed 3000 mg in 24 hours.  Continue oral iron pills.   Start exercising. You should be getting 150 minutes of moderate intensity exercise weekly.  It's important to improve your diet by reducing consumption of fast food, fried food, processed snack foods, sugary drinks. Increase consumption of fresh vegetables and fruits, whole grains, water.  Ensure you are drinking 64 ounces of water daily.  It was a pleasure to see you today!   Preventive Care 27 Years and Older, Male Preventive care refers to lifestyle choices and visits with your health care provider that can promote health and wellness. This includes:  A yearly physical exam. This is also called an annual well check.  Regular dental and eye exams.  Immunizations.  Screening for certain conditions.  Healthy lifestyle choices, such as diet and exercise. What can I expect for my preventive care visit? Physical exam Your health care provider will check:  Height and weight. These may be used to calculate body mass index (BMI), which is a measurement that tells if you are at a healthy weight.  Heart rate and blood pressure.  Your skin for abnormal spots. Counseling Your health care provider may ask you questions about:  Alcohol, tobacco, and drug use.  Emotional well-being.  Home and relationship well-being.  Sexual activity.  Eating habits.  History of falls.  Memory and ability to understand (cognition).  Work and work Statistician. What immunizations do I need?  Influenza (flu) vaccine  This is recommended every year. Tetanus, diphtheria, and pertussis (Tdap) vaccine  You may need a Td booster every 10 years. Varicella (chickenpox) vaccine  You may need this vaccine if you have not already been vaccinated. Zoster (shingles) vaccine  You may need this after age  75. Pneumococcal conjugate (PCV13) vaccine  One dose is recommended after age 35. Pneumococcal polysaccharide (PPSV23) vaccine  One dose is recommended after age 15. Measles, mumps, and rubella (MMR) vaccine  You may need at least one dose of MMR if you were born in 1957 or later. You may also need a second dose. Meningococcal conjugate (MenACWY) vaccine  You may need this if you have certain conditions. Hepatitis A vaccine  You may need this if you have certain conditions or if you travel or work in places where you may be exposed to hepatitis A. Hepatitis B vaccine  You may need this if you have certain conditions or if you travel or work in places where you may be exposed to hepatitis B. Haemophilus influenzae type b (Hib) vaccine  You may need this if you have certain conditions. You may receive vaccines as individual doses or as more than one vaccine together in one shot (combination vaccines). Talk with your health care provider about the risks and benefits of combination vaccines. What tests do I need? Blood tests  Lipid and cholesterol levels. These may be checked every 5 years, or more frequently depending on your overall health.  Hepatitis C test.  Hepatitis B test. Screening  Lung cancer screening. You may have this screening every year starting at age 42 if you have a 30-pack-year history of smoking and currently smoke or have quit within the past 15 years.  Colorectal cancer screening. All adults should have this screening starting at age 20 and continuing until age 73. Your health care provider may  recommend screening at age 50 if you are at increased risk. You will have tests every 1-10 years, depending on your results and the type of screening test.  Prostate cancer screening. Recommendations will vary depending on your family history and other risks.  Diabetes screening. This is done by checking your blood sugar (glucose) after you have not eaten for a while  (fasting). You may have this done every 1-3 years.  Abdominal aortic aneurysm (AAA) screening. You may need this if you are a current or former smoker.  Sexually transmitted disease (STD) testing. Follow these instructions at home: Eating and drinking  Eat a diet that includes fresh fruits and vegetables, whole grains, lean protein, and low-fat dairy products. Limit your intake of foods with high amounts of sugar, saturated fats, and salt.  Take vitamin and mineral supplements as recommended by your health care provider.  Do not drink alcohol if your health care provider tells you not to drink.  If you drink alcohol: ? Limit how much you have to 0-2 drinks a day. ? Be aware of how much alcohol is in your drink. In the [place] one drink equals one 12 oz bottle of beer (355 mL), one 5 oz glass of wine (148 mL), or one 1 oz glass of hard liquor (44 mL). Lifestyle  Take daily care of your teeth and gums.  Stay active. Exercise for at least 30 minutes on 5 or more days each week.  Do not use any products that contain nicotine or tobacco, such as cigarettes, e-cigarettes, and chewing tobacco. If you need help quitting, ask your health care provider.  If you are sexually active, practice safe sex. Use a condom or other form of protection to prevent STIs (sexually transmitted infections).  Talk with your health care provider about taking a low-dose aspirin or statin. What's next?  Visit your health care provider once a year for a well check visit.  Ask your health care provider how often you should have your eyes and teeth checked.  Stay up to date on all vaccines. This information is not intended to replace advice given to you by your health care provider. Make sure you discuss any questions you have with your health care provider. Document Released: 08/04/2015 Document Revised: 07/02/2018 Document Reviewed: 07/02/2018 Elsevier Patient Education  2020 [employer].

## 2019-06-21 NOTE — Assessment & Plan Note (Signed)
Immunizations UTD. Rx for Shingrix provided. PSA UTD. Colon cancer screening UTD, due in 2022. Encouraged regular exercise, healthy diet. Exam today stable. Labs reviewed.

## 2019-06-21 NOTE — Assessment & Plan Note (Signed)
Chronic, worse in the AM.  Discussed importance of weight loss, regular exercise. Exam today stable. Continue amitriptyline, gabapentin, PRN Ibuprofen/Tylenol.

## 2019-06-21 NOTE — Assessment & Plan Note (Signed)
Doing well on tamsulosin, continue same. 

## 2019-06-21 NOTE — Assessment & Plan Note (Signed)
Evaluated by cardiology in March 2020 and released. Appears euvolemic today. Continue BP control and beta blocker.

## 2019-06-21 NOTE — Assessment & Plan Note (Signed)
Resolved with recent A1C of 5.6. Encouraged weight loss through diet and exercise. Continue to monitor.

## 2019-06-21 NOTE — Progress Notes (Signed)
Subjective:    Patient ID: Timothy Sheppard, male    DOB: [birth year], 73 y.o.   MRN: [id number]  HPI  [name] is a 73 year old male who presents today for complete physical.  Immunizations: -Tetanus: Completed in 2015 -Influenza: Completed this season  -Shingles: Never completed -Pneumonia: Completed in 2018 and 2019  Diet: He endorses a fair diet. Some take out food on occasion. Drinking water, coffee, occasional soda, sweet tea.  Exercise: He is walking some, some free weights at home.  Eye exam: No recent exam Dental exam: No recent exam  Colonoscopy: Cologuard completed in 2019, due in 2022 PSA: 0.20 in 2019 Hep C Screen: Negative   BP Readings from Last 3 Encounters:  06/21/19 124/78  10/06/18 130/80  09/04/18 128/76     Review of Systems  Constitutional: Negative for unexpected weight change.  HENT: Negative for rhinorrhea.   Respiratory: Negative for cough and shortness of breath.   Cardiovascular: Negative for chest pain.  Gastrointestinal: Negative for constipation and diarrhea.  Genitourinary: Negative for difficulty urinating.  Musculoskeletal: Positive for arthralgias and back pain.  Skin: Negative for rash.  Allergic/Immunologic: Negative for environmental allergies.  Neurological: Negative for dizziness, numbness and headaches.  Psychiatric/Behavioral: The patient is not nervous/anxious.        Past Medical History:  Diagnosis Date  . CHF (congestive heart failure) ([place])   . Chronic hip pain    Right  . Erectile dysfunction   . Hyperlipidemia   . Hypertension   . Iritis   . Neuropathy      Social History   Socioeconomic History  . Marital status: Married    Spouse name: Not on file  . Number of children: Not on file  . Years of education: Not on file  . Highest education level: Not on file  Occupational History  . Not on file  Social Needs  . Financial resource strain: Not on file  . Food insecurity    Worry: Not on file   Inability: Not on file  . Transportation needs    Medical: Not on file    Non-medical: Not on file  Tobacco Use  . Smoking status: Former Smoker    Types: Pipe  . Smokeless tobacco: Never Used  . Tobacco comment: Quit 1 mo ago  Substance and Sexual Activity  . Alcohol use: No  . Drug use: No  . Sexual activity: Not on file  Lifestyle  . Physical activity    Days per week: Not on file    Minutes per session: Not on file  . Stress: Not on file  Relationships  . Social Herbalist on phone: Not on file    Gets together: Not on file    Attends religious service: Not on file    Active member of club or organization: Not on file    Attends meetings of clubs or organizations: Not on file    Relationship status: Not on file  . Intimate partner violence    Fear of current or ex partner: Not on file    Emotionally abused: Not on file    Physically abused: Not on file    Forced sexual activity: Not on file  Other Topics Concern  . Not on file  Social History Narrative   Married.   2 children, 2 grandchildren, 2 great grandchilden.   Retired.     Past Surgical History:  Procedure Laterality Date  . BACK SURGERY  2000  . HERNIA REPAIR    . ORIF FEMUR FRACTURE Right 1967   rod insertion  . TONSILLECTOMY AND ADENOIDECTOMY      Family History  Problem Relation Age of Onset  . Heart failure Mother   . Lupus Mother   . Heart failure Father     No Known Allergies  Current Outpatient Medications on File Prior to Visit  Medication Sig Dispense Refill  . amitriptyline (ELAVIL) 25 MG tablet Take 25 mg by mouth at bedtime.    Marland Kitchen aspirin 81 MG tablet Take 81 mg by mouth daily.    Marland Kitchen gabapentin (NEURONTIN) 300 MG capsule TAKE 1 CAPSULE BY MOUTH EVERY MORNING AND TAKE 3 CAPSULES AT BEDTIME FOR NEUROPATHY 360 capsule 1  . hydrochlorothiazide (HYDRODIURIL) 25 MG tablet Take 1 tablet (25 mg total) by mouth daily. 90 tablet 3  . lisinopril (PRINIVIL,ZESTRIL) 20 MG tablet Take 1  tablet (20 mg total) by mouth daily. 90 tablet 3  . metoprolol succinate (TOPROL-XL) 50 MG 24 hr tablet Take 1 tablet (50 mg total) by mouth daily. 90 tablet 3  . sildenafil (VIAGRA) 100 MG tablet Take 1 tablet by mouth 30 minutes prior to intercourse. 30 tablet 0  . tamsulosin (FLOMAX) 0.4 MG CAPS capsule TAKE 1 CAPSULE BY MOUTH IN THE MORNING FOR  URINE  STREAM 90 capsule 0   No current facility-administered medications on file prior to visit.     BP 124/78   Pulse 60   Temp 97.8 F (36.6 C) (Temporal)   Ht 5\' 11"  (1.803 m)   Wt 272 lb 8 oz (123.6 kg)   SpO2 98%   BMI 38.01 kg/m    Objective:   Physical Exam  Constitutional: He is oriented to person, place, and time. He appears well-nourished.  HENT:  Right Ear: Tympanic membrane and ear canal normal.  Left Ear: Tympanic membrane and ear canal normal.  Mouth/Throat: Oropharynx is clear and moist.  Eyes: Pupils are equal, round, and reactive to light. EOM are normal.  Neck: Neck supple.  Cardiovascular: Normal rate and regular rhythm.  Respiratory: Effort normal and breath sounds normal.  GI: Soft. Bowel sounds are normal. There is no abdominal tenderness.  Musculoskeletal: Normal range of motion.     Comments: Generalized decrease in ROM to lower back, stable. Ambulates well in clinic.   Neurological: He is alert and oriented to person, place, and time. No cranial nerve deficit.  Reflex Scores:      Patellar reflexes are 2+ on the right side and 2+ on the left side. Skin: Skin is warm and dry.  Psychiatric: He has a normal mood and affect.           Assessment & Plan:

## 2019-06-21 NOTE — Assessment & Plan Note (Signed)
Doing well on PRN sildenafil, continue same.

## 2019-06-21 NOTE — Assessment & Plan Note (Signed)
Stable in the office today, CMP reviewed. Continue metoprolol, HCTZ, lisinopril.

## 2019-08-15 ENCOUNTER — Other Ambulatory Visit: Payer: Self-pay | Admitting: Primary Care

## 2019-08-15 DIAGNOSIS — G609 Hereditary and idiopathic neuropathy, unspecified: Secondary | ICD-10-CM

## 2019-09-23 ENCOUNTER — Other Ambulatory Visit: Payer: Self-pay | Admitting: Primary Care

## 2019-09-23 DIAGNOSIS — R3912 Poor urinary stream: Secondary | ICD-10-CM

## 2019-09-23 DIAGNOSIS — N401 Enlarged prostate with lower urinary tract symptoms: Secondary | ICD-10-CM

## 2019-09-27 ENCOUNTER — Ambulatory Visit: Payer: Medicare Other

## 2019-10-01 ENCOUNTER — Encounter: Payer: Self-pay | Admitting: Primary Care

## 2019-10-01 ENCOUNTER — Ambulatory Visit (INDEPENDENT_AMBULATORY_CARE_PROVIDER_SITE_OTHER): Payer: Medicare Other | Admitting: Primary Care

## 2019-10-01 ENCOUNTER — Other Ambulatory Visit: Payer: Self-pay

## 2019-10-01 VITALS — BP 130/72 | HR 82 | Temp 96.6°F | Ht 71.0 in | Wt 278.5 lb

## 2019-10-01 DIAGNOSIS — I509 Heart failure, unspecified: Secondary | ICD-10-CM | POA: Diagnosis not present

## 2019-10-01 DIAGNOSIS — M79604 Pain in right leg: Secondary | ICD-10-CM | POA: Diagnosis not present

## 2019-10-01 DIAGNOSIS — M1990 Unspecified osteoarthritis, unspecified site: Secondary | ICD-10-CM

## 2019-10-01 DIAGNOSIS — G629 Polyneuropathy, unspecified: Secondary | ICD-10-CM | POA: Diagnosis not present

## 2019-10-01 DIAGNOSIS — G8929 Other chronic pain: Secondary | ICD-10-CM

## 2019-10-01 MED ORDER — MELOXICAM 15 MG PO TABS: 15.0000 mg | ORAL_TABLET | Freq: Every day | ORAL | 0 refills | Status: DC

## 2019-10-01 NOTE — Patient Instructions (Addendum)
Start meloxicam 15 mg once daily for arthritis and neuropathy.  Do not take Advil, Ibuprofen, Motrin, naproxen, Aleve with the meloxicam medication.  You may take Tylenol if needed for pain.  It was a pleasure to see you today!

## 2019-10-01 NOTE — Assessment & Plan Note (Signed)
Taking gabapentin 900 mg BID. Agree to addition of Meloxicam 15 mg daily. Discussed to avoid NSAID's with Meloxicam, okay to take Tylenol.  He will update.

## 2019-10-01 NOTE — Assessment & Plan Note (Signed)
Off lisinopril and metoprolol per cardiology. No longer seeing cardiology as he has been released.

## 2019-10-01 NOTE — Progress Notes (Signed)
Subjective:    Patient ID: Timothy Sheppard, male    DOB: 02/18/1946, 74 y.o.   MRN: [id number]  HPI  This visit occurred during the SARS-CoV-2 public health emergency.  Safety protocols were in place, including screening questions prior to the visit, additional usage of staff PPE, and extensive cleaning of exam room while observing appropriate contact time as indicated for disinfecting solutions.   Timothy Sheppard is a 74 year old male with a history of hypertension, CHF, neuropathy, osteoarthritis, chronic right lower extremity pain, prediabetes who presents today to discuss arthralgias.   Currently managed on amitriptyline 25 mg HS and gabapentin 300 mg for neuropathy and chronic pain. He would like to try Meloxicam as a family friend has tried this and speaks highly of the medication. He actually stopped his amitriptyline months ago due to next daytime drowsiness.   He will take gabapentin 900 mg twice daily. He is also taking ibuprofen and tylenol OTC.   Also off of his lisinopril and metoprolol succinate per cardiology. He has officially been released by his cardiologist.   BP Readings from Last 3 Encounters:  10/01/19 130/72  06/21/19 124/78  10/06/18 130/80     Review of Systems  Musculoskeletal: Positive for arthralgias.  Skin: Negative for color change.  Neurological: Positive for numbness.       Chronic neuropathy        Past Medical History:  Diagnosis Date  . CHF (congestive heart failure) ([place])   . Chronic hip pain    Right  . Erectile dysfunction   . Hyperlipidemia   . Hypertension   . Iritis   . Neuropathy      Social History   Socioeconomic History  . Marital status: Married    Spouse name: Not on file  . Number of children: Not on file  . Years of education: Not on file  . Highest education level: Not on file  Occupational History  . Not on file  Tobacco Use  . Smoking status: Former Smoker    Types: Pipe  . Smokeless tobacco: Never Used  .  Tobacco comment: Quit 1 mo ago  Substance and Sexual Activity  . Alcohol use: No  . Drug use: No  . Sexual activity: Not on file  Other Topics Concern  . Not on file  Social History Narrative   Married.   2 children, 2 grandchildren, 2 great grandchilden.   Retired.    Social Determinants of Health   Financial Resource Strain:   . Difficulty of Paying Living Expenses:   Food Insecurity:   . Worried About Charity fundraiser in the Last Year:   . Arboriculturist in the Last Year:   Transportation Needs:   . Film/video editor (Medical):   Marland Kitchen Lack of Transportation (Non-Medical):   Physical Activity:   . Days of Exercise per Week:   . Minutes of Exercise per Session:   Stress:   . Feeling of Stress :   Social Connections:   . Frequency of Communication with Friends and Family:   . Frequency of Social Gatherings with Friends and Family:   . Attends Religious Services:   . Active Member of Clubs or Organizations:   . Attends Archivist Meetings:   Marland Kitchen Marital Status:   Intimate Partner Violence:   . Fear of Current or Ex-Partner:   . Emotionally Abused:   Marland Kitchen Physically Abused:   . Sexually Abused:     Past  Surgical History:  Procedure Laterality Date  . BACK SURGERY  2000  . HERNIA REPAIR    . ORIF FEMUR FRACTURE Right 1967   rod insertion  . TONSILLECTOMY AND ADENOIDECTOMY      Family History  Problem Relation Age of Onset  . Heart failure Mother   . Lupus Mother   . Heart failure Father     No Known Allergies  Current Outpatient Medications on File Prior to Visit  Medication Sig Dispense Refill  . aspirin 81 MG tablet Take 81 mg by mouth daily.    Marland Kitchen gabapentin (NEURONTIN) 300 MG capsule TAKE 1 CAPSULE BY MOUTH EVERY MORNING AND TAKE 3 CAPSULES AT BEDTIME FOR NEUROPATHY 360 capsule 1  . hydrochlorothiazide (HYDRODIURIL) 25 MG tablet Take 1 tablet (25 mg total) by mouth daily. 90 tablet 3  . sildenafil (VIAGRA) 100 MG tablet Take 1 tablet by mouth  30 minutes prior to intercourse. 30 tablet 0  . tamsulosin (FLOMAX) 0.4 MG CAPS capsule TAKE 1 CAPSULE BY MOUTH IN THE MORNING FOR  URINE  STREAM 90 capsule 1   No current facility-administered medications on file prior to visit.    BP 130/72   Pulse 82   Temp (!) 96.6 F (35.9 C) (Temporal)   Ht 5\' 11"  (1.803 m)   Wt 278 lb 8 oz (126.3 kg)   SpO2 98%   BMI 38.84 kg/m    Objective:   Physical Exam  Constitutional: He appears well-nourished.  Cardiovascular: Normal rate and regular rhythm.  Respiratory: Effort normal and breath sounds normal.  Musculoskeletal:     Cervical back: Neck supple.  Skin: Skin is warm and dry.           Assessment & Plan:

## 2019-10-18 ENCOUNTER — Other Ambulatory Visit: Payer: Self-pay

## 2019-10-18 ENCOUNTER — Encounter: Payer: Self-pay | Admitting: Internal Medicine

## 2019-10-18 ENCOUNTER — Ambulatory Visit (INDEPENDENT_AMBULATORY_CARE_PROVIDER_SITE_OTHER): Payer: Medicare Other | Admitting: Internal Medicine

## 2019-10-18 VITALS — BP 142/82 | HR 63 | Temp 98.0°F | Resp 20 | Wt 283.0 lb

## 2019-10-18 DIAGNOSIS — R609 Edema, unspecified: Secondary | ICD-10-CM | POA: Diagnosis not present

## 2019-10-18 DIAGNOSIS — I509 Heart failure, unspecified: Secondary | ICD-10-CM

## 2019-10-18 MED ORDER — FUROSEMIDE 20 MG PO TABS: 20.0000 mg | ORAL_TABLET | Freq: Every day | ORAL | 0 refills | Status: DC

## 2019-10-18 NOTE — Progress Notes (Signed)
Subjective:    Patient ID: Timothy Sheppard, male    DOB: March 19, 1946, 74 y.o.   MRN: [id number]  HPI  Pt presents to the clinic today with c/o swelling in his bilateral feet. This started a few months ago but has worsened in the last few weeks. He reports the swelling causes some tightness in his lower extremities but he denies pain or difficulty with gait. He has not noticed any redness or warmth of the lower extremities. He denies chest pain or SOB. He has a history of CHF, taking HCTZ daily. He denies recent changes in diet or medications. Echo from 04/2013 reviewed.  Review of Systems  Past Medical History:  Diagnosis Date  . CHF (congestive heart failure) ([place])   . Chronic hip pain    Right  . Erectile dysfunction   . Hyperlipidemia   . Hypertension   . Iritis   . Neuropathy     Current Outpatient Medications  Medication Sig Dispense Refill  . aspirin 81 MG tablet Take 81 mg by mouth daily.    Marland Kitchen gabapentin (NEURONTIN) 300 MG capsule TAKE 1 CAPSULE BY MOUTH EVERY MORNING AND TAKE 3 CAPSULES AT BEDTIME FOR NEUROPATHY 360 capsule 1  . hydrochlorothiazide (HYDRODIURIL) 25 MG tablet Take 1 tablet (25 mg total) by mouth daily. 90 tablet 3  . meloxicam (MOBIC) 15 MG tablet Take 1 tablet (15 mg total) by mouth daily. As needed for pain. 90 tablet 0  . sildenafil (VIAGRA) 100 MG tablet Take 1 tablet by mouth 30 minutes prior to intercourse. 30 tablet 0  . tamsulosin (FLOMAX) 0.4 MG CAPS capsule TAKE 1 CAPSULE BY MOUTH IN THE MORNING FOR  URINE  STREAM 90 capsule 1   No current facility-administered medications for this visit.    No Known Allergies  Family History  Problem Relation Age of Onset  . Heart failure Mother   . Lupus Mother   . Heart failure Father     Social History   Socioeconomic History  . Marital status: Married    Spouse name: Not on file  . Number of children: Not on file  . Years of education: Not on file  . Highest education level: Not on file    Occupational History  . Not on file  Tobacco Use  . Smoking status: Former Smoker    Types: Pipe  . Smokeless tobacco: Never Used  . Tobacco comment: Quit 1 mo ago  Substance and Sexual Activity  . Alcohol use: No  . Drug use: No  . Sexual activity: Not on file  Other Topics Concern  . Not on file  Social History Narrative   Married.   2 children, 2 grandchildren, 2 great grandchilden.   Retired.    Social Determinants of Health   Financial Resource Strain:   . Difficulty of Paying Living Expenses:   Food Insecurity:   . Worried About Charity fundraiser in the Last Year:   . Arboriculturist in the Last Year:   Transportation Needs:   . Film/video editor (Medical):   Marland Kitchen Lack of Transportation (Non-Medical):   Physical Activity:   . Days of Exercise per Week:   . Minutes of Exercise per Session:   Stress:   . Feeling of Stress :   Social Connections:   . Frequency of Communication with Friends and Family:   . Frequency of Social Gatherings with Friends and Family:   . Attends Religious Services:   .  Active Member of Clubs or Organizations:   . Attends Archivist Meetings:   Marland Kitchen Marital Status:   Intimate Partner Violence:   . Fear of Current or Ex-Partner:   . Emotionally Abused:   Marland Kitchen Physically Abused:   . Sexually Abused:      Constitutional: Denies fever, malaise, fatigue, headache or abrupt weight changes.  Respiratory: Denies difficulty breathing, shortness of breath, cough or sputum production.   Cardiovascular: Pt reports swelling in bilateral feet. Denies chest pain, chest tightness, palpitations or swelling in the hands.  Musculoskeletal: Denies decrease in range of motion, difficulty with gait, muscle pain or joint pain and swelling.  Skin: Denies redness, rashes, lesions or ulcercations.    No other specific complaints in a complete review of systems (except as listed in HPI above).     Objective:   Physical Exam  BP (!) 142/82    Pulse 63   Temp 98 F (36.7 C)   Resp 20   Wt 283 lb (128.4 kg)   SpO2 97%   BMI 39.47 kg/m   Wt Readings from Last 3 Encounters:  10/01/19 278 lb 8 oz (126.3 kg)  06/21/19 272 lb 8 oz (123.6 kg)  10/06/18 273 lb 6.4 oz (124 kg)    General: Appears  his stated age, obese, in NAD. Skin: Warm, dry and intact. No redness or warmth noted. Cardiovascular: Normal rate and rhythm. S1,S2 noted.  No murmur, rubs or gallops noted. No JVD. 1+ pitting BLE edema.  Pulmonary/Chest: Normal effort and positive vesicular breath sounds. No respiratory distress. No wheezes, rales or ronchi noted.  Musculoskeletal: Gait slow and steady without device. Neurological: Alert and oriented.    BMET    Component Value Date/Time   NA 139 06/14/2019 1050   NA 136 04/07/2017 0826   K 4.1 06/14/2019 1050   CL 101 06/14/2019 1050   CO2 29 06/14/2019 1050   GLUCOSE 151 (H) 06/14/2019 1050   BUN 22 06/14/2019 1050   BUN 14 04/07/2017 0826   CREATININE 1.11 06/14/2019 1050   CREATININE 1.10 05/23/2014 0922   CALCIUM 9.8 06/14/2019 1050   GFRNONAA 71 04/07/2017 0826   GFRAA 82 04/07/2017 0826    Lipid Panel     Component Value Date/Time   CHOL 148 06/14/2019 1050   CHOL 131 04/07/2017 0826   TRIG 67.0 06/14/2019 1050   HDL 47.10 06/14/2019 1050   HDL 59 04/07/2017 0826   CHOLHDL 3 06/14/2019 1050   VLDL 13.4 06/14/2019 1050   LDLCALC 88 06/14/2019 1050   LDLCALC 61 04/07/2017 0826    CBC    Component Value Date/Time   WBC 7.5 06/14/2019 1050   RBC 4.37 06/14/2019 1050   HGB 14.0 06/14/2019 1050   HGB 8.5 (L) 04/08/2017 1022   HCT 41.0 06/14/2019 1050   HCT 28.1 (L) 04/08/2017 1022   PLT 217.0 06/14/2019 1050   PLT 328 04/08/2017 1022   MCV 93.9 06/14/2019 1050   MCV 68 (L) 04/08/2017 1022   MCH 20.4 (L) 04/08/2017 1022   MCH 28.9 04/25/2013 1215   MCHC 34.2 06/14/2019 1050   RDW 13.8 06/14/2019 1050   RDW 16.3 (H) 04/08/2017 1022   LYMPHSABS 1.1 04/08/2017 1022   MONOABS 0.4  04/25/2013 1215   EOSABS 0.1 04/08/2017 1022   BASOSABS 0.0 04/08/2017 1022    Hgb A1C Lab Results  Component Value Date   HGBA1C 5.6 06/14/2019  Assessment & Plan:   Peripheral Edema, CHF:  Stop HCTZ RX for Furosemide 20 mg daily  Encouraged low salt diet, increased aerobic exercise, weight loss, elevation of the legs Monitor weights daily  RTC in 2 weeks for follow up with PCP, BMET  Webb Silversmith, NP This visit occurred during the SARS-CoV-2 public health emergency.  Safety protocols were in place, including screening questions prior to the visit, additional usage of staff PPE, and extensive cleaning of exam room while observing appropriate contact time as indicated for disinfecting solutions.

## 2019-10-18 NOTE — Patient Instructions (Signed)

## 2019-11-02 ENCOUNTER — Ambulatory Visit (INDEPENDENT_AMBULATORY_CARE_PROVIDER_SITE_OTHER): Payer: Medicare Other | Admitting: Primary Care

## 2019-11-02 ENCOUNTER — Ambulatory Visit (INDEPENDENT_AMBULATORY_CARE_PROVIDER_SITE_OTHER): Payer: Medicare Other

## 2019-11-02 ENCOUNTER — Encounter: Payer: Self-pay | Admitting: Primary Care

## 2019-11-02 ENCOUNTER — Other Ambulatory Visit: Payer: Self-pay

## 2019-11-02 VITALS — BP 140/80 | HR 65 | Temp 96.8°F | Ht 71.0 in | Wt 268.5 lb

## 2019-11-02 DIAGNOSIS — R609 Edema, unspecified: Secondary | ICD-10-CM

## 2019-11-02 DIAGNOSIS — Z0000 Encounter for general adult medical examination without abnormal findings: Secondary | ICD-10-CM

## 2019-11-02 DIAGNOSIS — R600 Localized edema: Secondary | ICD-10-CM

## 2019-11-02 DIAGNOSIS — I509 Heart failure, unspecified: Secondary | ICD-10-CM | POA: Diagnosis not present

## 2019-11-02 LAB — BASIC METABOLIC PANEL
BUN: 26 mg/dL — ABNORMAL HIGH (ref 6–23)
CO2: 32 mEq/L (ref 19–32)
Calcium: 9.4 mg/dL (ref 8.4–10.5)
Chloride: 101 mEq/L (ref 96–112)
Creatinine, Ser: 1.29 mg/dL (ref 0.40–1.50)
GFR: 54.79 mL/min — ABNORMAL LOW (ref >60.00)
Glucose, Bld: 90 mg/dL (ref 70–99)
Potassium: 3.8 mEq/L (ref 3.5–5.1)
Sodium: 139 mEq/L (ref 135–145)

## 2019-11-02 LAB — BRAIN NATRIURETIC PEPTIDE: Pro B Natriuretic peptide (BNP): 84 pg/mL (ref 0.0–100.0)

## 2019-11-02 NOTE — Progress Notes (Signed)
Subjective:   Timothy Sheppard is a 74 y.o. male who presents for Medicare Annual/Subsequent preventive examination.  Review of Systems: N/A   This visit is being conducted through telemedicine via telephone at the nurse health advisor's home address due to the COVID-19 pandemic. This patient has given me verbal consent via doximity to conduct this visit, patient states they are participating from their home address. Patient and myself are on the telephone call. There is no referral for this visit. Some vital signs may be absent or patient reported.    Patient identification: identified by name, DOB, and current address   Cardiac Risk Factors include: advanced age (>66men, >66 women);hypertension;male gender     Objective:    Vitals: BP 140/80   Pulse 65   Temp (!) 96.8 F (36 C)   Ht 5\' 11"  (1.803 m)   Wt 268 lb 8 oz (121.8 kg)   BMI 37.45 kg/m   Body mass index is 37.45 kg/m.  Advanced Directives 11/02/2019 06/08/2018 11/25/2017 07/18/2017 06/09/2017 05/05/2017 04/14/2017  Does Patient Have a Medical Advance Directive? Yes Yes No No No No No  Type of Paramedic of [place];Living will [place];Living will - - - - -  Copy of [place] in Chart? No - copy requested No - copy requested - - - - -  Would patient like information on creating a medical advance directive? - - No - Patient declined No - Patient declined No - Patient declined No - Patient declined No - Patient declined  Pre-existing out of facility DNR order (yellow form or pink MOST form) - - - - - - -    Tobacco Social History   Tobacco Use  Smoking Status Former Smoker  . Types: Pipe  Smokeless Tobacco Never Used  Tobacco Comment   Quit 1 mo ago     Counseling given: Not Answered Comment: Quit 1 mo ago   Clinical Intake:  Pre-visit preparation completed: Yes  Pain : No/denies pain     Nutritional Risks: None Diabetes: No  How often do  you need to have someone help you when you read instructions, pamphlets, or other written materials from your doctor or pharmacy?: 1 - Never What is the last grade level you completed in school?: 12th  Interpreter Needed?: No  Information entered by :: CJohnson, LPN  Past Medical History:  Diagnosis Date  . CHF (congestive heart failure) ([place])   . Chronic hip pain    Right  . Erectile dysfunction   . Hyperlipidemia   . Hypertension   . Iritis   . Neuropathy    Past Surgical History:  Procedure Laterality Date  . BACK SURGERY  2000  . HERNIA REPAIR    . ORIF FEMUR FRACTURE Right 1967   rod insertion  . TONSILLECTOMY AND ADENOIDECTOMY     Family History  Problem Relation Age of Onset  . Heart failure Mother   . Lupus Mother   . Heart failure Father    Social History   Socioeconomic History  . Marital status: Married    Spouse name: Not on file  . Number of children: Not on file  . Years of education: Not on file  . Highest education level: Not on file  Occupational History  . Not on file  Tobacco Use  . Smoking status: Former Smoker    Types: Pipe  . Smokeless tobacco: Never Used  . Tobacco comment: Quit 1 mo  ago  Substance and Sexual Activity  . Alcohol use: No  . Drug use: No  . Sexual activity: Not on file  Other Topics Concern  . Not on file  Social History Narrative   Married.   2 children, 2 grandchildren, 2 great grandchilden.   Retired.    Social Determinants of Health   Financial Resource Strain: Low Risk   . Difficulty of Paying Living Expenses: Not hard at all  Food Insecurity: No Food Insecurity  . Worried About Charity fundraiser in the Last Year: Never true  . Ran Out of Food in the Last Year: Never true  Transportation Needs: No Transportation Needs  . Lack of Transportation (Medical): No  . Lack of Transportation (Non-Medical): No  Physical Activity: Inactive  . Days of Exercise per Week: 0 days  . Minutes of Exercise per Session: 0  min  Stress: No Stress Concern Present  . Feeling of Stress : Not at all  Social Connections:   . Frequency of Communication with Friends and Family:   . Frequency of Social Gatherings with Friends and Family:   . Attends Religious Services:   . Active Member of Clubs or Organizations:   . Attends Archivist Meetings:   Marland Kitchen Marital Status:     Outpatient Encounter Medications as of 11/02/2019  Medication Sig  . aspirin 81 MG tablet Take 81 mg by mouth daily.  . furosemide (LASIX) 20 MG tablet Take 1 tablet (20 mg total) by mouth daily.  Marland Kitchen gabapentin (NEURONTIN) 300 MG capsule TAKE 1 CAPSULE BY MOUTH EVERY MORNING AND TAKE 3 CAPSULES AT BEDTIME FOR NEUROPATHY  . meloxicam (MOBIC) 15 MG tablet Take 1 tablet (15 mg total) by mouth daily. As needed for pain.  . sildenafil (VIAGRA) 100 MG tablet Take 1 tablet by mouth 30 minutes prior to intercourse.  . tamsulosin (FLOMAX) 0.4 MG CAPS capsule TAKE 1 CAPSULE BY MOUTH IN THE MORNING FOR  URINE  STREAM   No facility-administered encounter medications on file as of 11/02/2019.    Activities of Daily Living In your present state of health, do you have any difficulty performing the following activities: 11/02/2019  Hearing? N  Vision? N  Difficulty concentrating or making decisions? N  Walking or climbing stairs? N  Dressing or bathing? N  Doing errands, shopping? N  Preparing Food and eating ? N  Using the Toilet? N  In the past six months, have you accidently leaked urine? N  Do you have problems with loss of bowel control? N  Managing your Medications? N  Managing your Finances? N  Housekeeping or managing your Housekeeping? N  Some recent data might be hidden    Patient Care Team: [name], NP as PCP - General (Internal Medicine)   Assessment:   This is a routine wellness examination for Timothy Sheppard.  Exercise Activities and Dietary recommendations Current Exercise Habits: The patient does not participate in regular  exercise at present, Exercise limited by: None identified  Goals    . Increase physical activity     Starting 06/08/2018, I will continue to walk at least 60 minutes every other day.     . Patient Stated     11/02/2019, I will maintain and continue medications as prescribed.        Fall Risk Fall Risk  11/02/2019 06/08/2018 11/25/2017 07/18/2017 06/09/2017  Falls in the past year? 1 0 No No No  Comment fell out of bed - - - -  Number falls in past yr: 0 - - - -  Injury with Fall? 0 - - - -  Risk for fall due to : Medication side effect - - - -  Follow up Falls evaluation completed;Falls prevention discussed - - - -   Is the patient's home free of loose throw rugs in walkways, pet beds, electrical cords, etc?   yes      Grab bars in the bathroom? yes      Handrails on the stairs?   yes      Adequate lighting?   yes  Timed Get Up and Go Performed: N/A  Depression Screen PHQ 2/9 Scores 11/02/2019 06/08/2018 11/25/2017 07/18/2017  PHQ - 2 Score 0 0 0 0  PHQ- 9 Score 0 0 - -    Cognitive Function MMSE - Mini Mental State Exam 11/02/2019 06/08/2018  Orientation to time 5 5  Orientation to Place 5 5  Registration 3 3  Attention/ Calculation 5 0  Recall 3 3  Language- name 2 objects - 0  Language- repeat 1 1  Language- follow 3 step command - 3  Language- read & follow direction - 0  Write a sentence - 0  Copy design - 0  Total score - 20  Mini Cog  Mini-Cog screen was completed. Maximum score is 22. A value of 0 denotes this part of the MMSE was not completed or the patient failed this part of the Mini-Cog screening.       Immunization History  Administered Date(s) Administered  . Influenza,inj,Quad PF,6+ Mos 04/21/2013, 04/11/2015, 04/02/2018  . Influenza-Unspecified 04/14/2017  . Pneumococcal Conjugate-13 06/09/2017  . Pneumococcal Polysaccharide-23 06/08/2018  . Tdap 05/11/2014    Qualifies for Shingles Vaccine: Yes  Screening Tests Health Maintenance  Topic  Date Due  . INFLUENZA VACCINE  02/20/2020  . Fecal DNA (Cologuard)  06/22/2021  . TETANUS/TDAP  05/11/2024  . Hepatitis C Screening  Completed  . PNA vac Low Risk Adult  Completed   Cancer Screenings: Lung: Low Dose CT Chest recommended if Age 14-80 years, 30 pack-year currently smoking OR have quit w/in 15 years. Patient does not qualify. Colorectal: Cologuard completed 06/22/2018  Additional Screenings:  Hepatitis C Screening: 09/04/2018      Plan:   Patient will maintain and continue medications as prescribed.   I have personally reviewed and noted the following in the patient's chart:   . Medical and social history . Use of alcohol, tobacco or illicit drugs  . Current medications and supplements . Functional ability and status . Nutritional status . Physical activity . Advanced directives . List of other physicians . Hospitalizations, surgeries, and ER visits in previous 12 months . Vitals . Screenings to include cognitive, depression, and falls . Referrals and appointments  In addition, I have reviewed and discussed with patient certain preventive protocols, quality metrics, and best practice recommendations. A written personalized care plan for preventive services as well as general preventive health recommendations were provided to patient.     Andrez Grime, LPN  [id number]

## 2019-11-02 NOTE — Patient Instructions (Addendum)
Stop by the lab prior to leaving today. I will notify you of your results once received.   You will be contacted regarding your echocardiogram.  Please let us know if you have not been contacted within two weeks.   It was a pleasure to see you today!

## 2019-11-02 NOTE — Progress Notes (Signed)
PCP notes:  Health Maintenance: No gaps noted    Abnormal Screenings: none   Patient concerns: none   Nurse concerns: none   Next PCP appt: none 

## 2019-11-02 NOTE — Patient Instructions (Signed)
[name] , Thank you for taking time to come for your Medicare Wellness Visit. I appreciate your ongoing commitment to your health goals. Please review the following plan we discussed and let me know if I can assist you in the future.   Screening recommendations/referrals: Colonoscopy: Cologuard completed 06/22/2018 Recommended yearly ophthalmology/optometry visit for glaucoma screening and checkup Recommended yearly dental visit for hygiene and checkup  Vaccinations: Influenza vaccine: Fall 2021 Pneumococcal vaccine: Completed series Tdap vaccine: Up to date, completed 05/11/2014 Shingles vaccine: discussed     Advanced directives: Please bring a copy of your POA (Power of [place]) and/or Living Will to your next appointment.   Conditions/risks identified: hypertension  Next appointment: none  Preventive Care 72 Years and Older, Male Preventive care refers to lifestyle choices and visits with your health care provider that can promote health and wellness. What does preventive care include?  A yearly physical exam. This is also called an annual well check.  Dental exams once or twice a year.  Routine eye exams. Ask your health care provider how often you should have your eyes checked.  Personal lifestyle choices, including:  Daily care of your teeth and gums.  Regular physical activity.  Eating a healthy diet.  Avoiding tobacco and drug use.  Limiting alcohol use.  Practicing safe sex.  Taking low doses of aspirin every day.  Taking vitamin and mineral supplements as recommended by your health care provider. What happens during an annual well check? The services and screenings done by your health care provider during your annual well check will depend on your age, overall health, lifestyle risk factors, and family history of disease. Counseling  Your health care provider may ask you questions about your:  Alcohol use.  Tobacco use.  Drug use.  Emotional  well-being.  Home and relationship well-being.  Sexual activity.  Eating habits.  History of falls.  Memory and ability to understand (cognition).  Work and work Statistician. Screening  You may have the following tests or measurements:  Height, weight, and BMI.  Blood pressure.  Lipid and cholesterol levels. These may be checked every 5 years, or more frequently if you are over 32 years old.  Skin check.  Lung cancer screening. You may have this screening every year starting at age 15 if you have a 30-pack-year history of smoking and currently smoke or have quit within the past 15 years.  Fecal occult blood test (FOBT) of the stool. You may have this test every year starting at age 20.  Flexible sigmoidoscopy or colonoscopy. You may have a sigmoidoscopy every 5 years or a colonoscopy every 10 years starting at age 14.  Prostate cancer screening. Recommendations will vary depending on your family history and other risks.  Hepatitis C blood test.  Hepatitis B blood test.  Sexually transmitted disease (STD) testing.  Diabetes screening. This is done by checking your blood sugar (glucose) after you have not eaten for a while (fasting). You may have this done every 1-3 years.  Abdominal aortic aneurysm (AAA) screening. You may need this if you are a current or former smoker.  Osteoporosis. You may be screened starting at age 72 if you are at high risk. Talk with your health care provider about your test results, treatment options, and if necessary, the need for more tests. Vaccines  Your health care provider may recommend certain vaccines, such as:  Influenza vaccine. This is recommended every year.  Tetanus, diphtheria, and acellular pertussis (Tdap, Td) vaccine. You  may need a Td booster every 10 years.  Zoster vaccine. You may need this after age 23.  Pneumococcal 13-valent conjugate (PCV13) vaccine. One dose is recommended after age 76.  Pneumococcal  polysaccharide (PPSV23) vaccine. One dose is recommended after age 19. Talk to your health care provider about which screenings and vaccines you need and how often you need them. This information is not intended to replace advice given to you by your health care provider. Make sure you discuss any questions you have with your health care provider. Document Released: 08/04/2015 Document Revised: 03/27/2016 Document Reviewed: 05/09/2015 Elsevier Interactive Patient Education  2017 [place] Prevention in the Home Falls can cause injuries. They can happen to people of all ages. There are many things you can do to make your home safe and to help prevent falls. What can I do on the outside of my home?  Regularly fix the edges of walkways and driveways and fix any cracks.  Remove anything that might make you trip as you walk through a door, such as a raised step or threshold.  Trim any bushes or trees on the path to your home.  Use bright outdoor lighting.  Clear any walking paths of anything that might make someone trip, such as rocks or tools.  Regularly check to see if handrails are loose or broken. Make sure that both sides of any steps have handrails.  Any raised decks and porches should have guardrails on the edges.  Have any leaves, snow, or ice cleared regularly.  Use sand or salt on walking paths during winter.  Clean up any spills in your garage right away. This includes oil or grease spills. What can I do in the bathroom?  Use night lights.  Install grab bars by the toilet and in the tub and shower. Do not use towel bars as grab bars.  Use non-skid mats or decals in the tub or shower.  If you need to sit down in the shower, use a plastic, non-slip stool.  Keep the floor dry. Clean up any water that spills on the floor as soon as it happens.  Remove soap buildup in the tub or shower regularly.  Attach bath mats securely with double-sided non-slip rug  tape.  Do not have throw rugs and other things on the floor that can make you trip. What can I do in the bedroom?  Use night lights.  Make sure that you have a light by your bed that is easy to reach.  Do not use any sheets or blankets that are too big for your bed. They should not hang down onto the floor.  Have a firm chair that has side arms. You can use this for support while you get dressed.  Do not have throw rugs and other things on the floor that can make you trip. What can I do in the kitchen?  Clean up any spills right away.  Avoid walking on wet floors.  Keep items that you use a lot in easy-to-reach places.  If you need to reach something above you, use a strong step stool that has a grab bar.  Keep electrical cords out of the way.  Do not use floor polish or wax that makes floors slippery. If you must use wax, use non-skid floor wax.  Do not have throw rugs and other things on the floor that can make you trip. What can I do with my stairs?  Do not leave any items  on the stairs.  Make sure that there are handrails on both sides of the stairs and use them. Fix handrails that are broken or loose. Make sure that handrails are as long as the stairways.  Check any carpeting to make sure that it is firmly attached to the stairs. Fix any carpet that is loose or worn.  Avoid having throw rugs at the top or bottom of the stairs. If you do have throw rugs, attach them to the floor with carpet tape.  Make sure that you have a light switch at the top of the stairs and the bottom of the stairs. If you do not have them, ask someone to add them for you. What else can I do to help prevent falls?  Wear shoes that:  Do not have high heels.  Have rubber bottoms.  Are comfortable and fit you well.  Are closed at the toe. Do not wear sandals.  If you use a stepladder:  Make sure that it is fully opened. Do not climb a closed stepladder.  Make sure that both sides of the  stepladder are locked into place.  Ask someone to hold it for you, if possible.  Clearly mark and make sure that you can see:  Any grab bars or handrails.  First and last steps.  Where the edge of each step is.  Use tools that help you move around (mobility aids) if they are needed. These include:  Canes.  Walkers.  Scooters.  Crutches.  Turn on the lights when you go into a dark area. Replace any light bulbs as soon as they burn out.  Set up your furniture so you have a clear path. Avoid moving your furniture around.  If any of your floors are uneven, fix them.  If there are any pets around you, be aware of where they are.  Review your medicines with your doctor. Some medicines can make you feel dizzy. This can increase your chance of falling. Ask your doctor what other things that you can do to help prevent falls. This information is not intended to replace advice given to you by your health care provider. Make sure you discuss any questions you have with your health care provider. Document Released: 05/04/2009 Document Revised: 12/14/2015 Document Reviewed: 08/12/2014 Elsevier Interactive Patient Education  2017 [employer].

## 2019-11-02 NOTE — Assessment & Plan Note (Signed)
Increased lower extremity edema over the last several weeks, slightly improved with changing from thiazide to loop diuretic.   Check BMP and BNP today. Check echocardiogram, last one in 2014.  Pedal pulses good today. Consider increasing furosemide to 40 mg if BMP is stable.

## 2019-11-02 NOTE — Progress Notes (Signed)
Subjective:    Patient ID: Timothy Sheppard, male    DOB: 06-22-1946, 74 y.o.   MRN: [id number]  HPI  This visit occurred during the SARS-CoV-2 public health emergency.  Safety protocols were in place, including screening questions prior to the visit, additional usage of staff PPE, and extensive cleaning of exam room while observing appropriate contact time as indicated for disinfecting solutions.   Timothy Sheppard is a 74 year old male with a history of hypertension, CHF, neuropathy, BPH, prediabetes, erectile dysfunction, arthritis who presents today for follow up.   He was last evaluated by Webb Silversmith on 10/18/19 for complaints of bilateral lower extremity edema that began several weeks prior, increased during time of visit. HCTZ was discontinued and furosemide 20 mg was initiated. He was encouraged to return today for evaluation.  Since his last visit his swelling has improved slightly, he would like an increase in the furosemide. His feet are so swollen at times that he cannot wear anything but bedroom slippers. He is very active during the day, working around his home and in his house. Swelling is typically not much better during the morning when he wakes. He denies cough, exertional dyspnea, chest pain. He has noticed chronic redness to bilateral lower extremities, no worse. Non smoker, denies lower extremity pain with ambulation.  Previously following with cardiology, released about one year ago. Last echocardiogram on file was from 2014.     BP Readings from Last 3 Encounters:  11/02/19 140/80  10/18/19 (!) 142/82  10/01/19 130/72     Review of Systems  Respiratory: Negative for shortness of breath.   Cardiovascular: Positive for leg swelling. Negative for chest pain.  Musculoskeletal: Positive for arthralgias.  Skin: Negative for color change.       Past Medical History:  Diagnosis Date  . CHF (congestive heart failure) ([place])   . Chronic hip pain    Right  . Erectile  dysfunction   . Hyperlipidemia   . Hypertension   . Iritis   . Neuropathy      Social History   Socioeconomic History  . Marital status: Married    Spouse name: Not on file  . Number of children: Not on file  . Years of education: Not on file  . Highest education level: Not on file  Occupational History  . Not on file  Tobacco Use  . Smoking status: Former Smoker    Types: Pipe  . Smokeless tobacco: Never Used  . Tobacco comment: Quit 1 mo ago  Substance and Sexual Activity  . Alcohol use: No  . Drug use: No  . Sexual activity: Not on file  Other Topics Concern  . Not on file  Social History Narrative   Married.   2 children, 2 grandchildren, 2 great grandchilden.   Retired.    Social Determinants of Health   Financial Resource Strain:   . Difficulty of Paying Living Expenses:   Food Insecurity:   . Worried About Charity fundraiser in the Last Year:   . Arboriculturist in the Last Year:   Transportation Needs:   . Film/video editor (Medical):   Marland Kitchen Lack of Transportation (Non-Medical):   Physical Activity:   . Days of Exercise per Week:   . Minutes of Exercise per Session:   Stress:   . Feeling of Stress :   Social Connections:   . Frequency of Communication with Friends and Family:   . Frequency of Social  Gatherings with Friends and Family:   . Attends Religious Services:   . Active Member of Clubs or Organizations:   . Attends Archivist Meetings:   Marland Kitchen Marital Status:   Intimate Partner Violence:   . Fear of Current or Ex-Partner:   . Emotionally Abused:   Marland Kitchen Physically Abused:   . Sexually Abused:     Past Surgical History:  Procedure Laterality Date  . BACK SURGERY  2000  . HERNIA REPAIR    . ORIF FEMUR FRACTURE Right 1967   rod insertion  . TONSILLECTOMY AND ADENOIDECTOMY      Family History  Problem Relation Age of Onset  . Heart failure Mother   . Lupus Mother   . Heart failure Father     No Known Allergies  Current  Outpatient Medications on File Prior to Visit  Medication Sig Dispense Refill  . aspirin 81 MG tablet Take 81 mg by mouth daily.    . furosemide (LASIX) 20 MG tablet Take 1 tablet (20 mg total) by mouth daily. 30 tablet 0  . gabapentin (NEURONTIN) 300 MG capsule TAKE 1 CAPSULE BY MOUTH EVERY MORNING AND TAKE 3 CAPSULES AT BEDTIME FOR NEUROPATHY 360 capsule 1  . meloxicam (MOBIC) 15 MG tablet Take 1 tablet (15 mg total) by mouth daily. As needed for pain. 90 tablet 0  . sildenafil (VIAGRA) 100 MG tablet Take 1 tablet by mouth 30 minutes prior to intercourse. 30 tablet 0  . tamsulosin (FLOMAX) 0.4 MG CAPS capsule TAKE 1 CAPSULE BY MOUTH IN THE MORNING FOR  URINE  STREAM 90 capsule 1   No current facility-administered medications on file prior to visit.    BP 140/80   Pulse 65   Temp (!) 96.8 F (36 C) (Temporal)   Ht 5\' 11"  (1.803 m)   Wt 268 lb 8 oz (121.8 kg)   SpO2 97%   BMI 37.45 kg/m    Objective:   Physical Exam  Constitutional: He appears well-nourished.  Cardiovascular: Normal rate and regular rhythm.  Moderate lower extremity edema from ankle to toes. No pitting. Mild rubor color bilaterally.   Skin: Skin is warm and dry.  Mild rubor bilaterally to lower extremities            Assessment & Plan:

## 2019-11-03 ENCOUNTER — Other Ambulatory Visit: Payer: Self-pay | Admitting: Primary Care

## 2019-11-03 DIAGNOSIS — R609 Edema, unspecified: Secondary | ICD-10-CM

## 2019-11-03 DIAGNOSIS — N529 Male erectile dysfunction, unspecified: Secondary | ICD-10-CM

## 2019-11-09 ENCOUNTER — Other Ambulatory Visit: Payer: Self-pay | Admitting: Internal Medicine

## 2019-11-11 ENCOUNTER — Other Ambulatory Visit (INDEPENDENT_AMBULATORY_CARE_PROVIDER_SITE_OTHER): Payer: Medicare Other

## 2019-11-11 ENCOUNTER — Other Ambulatory Visit: Payer: Self-pay | Admitting: Primary Care

## 2019-11-11 DIAGNOSIS — R609 Edema, unspecified: Secondary | ICD-10-CM | POA: Diagnosis not present

## 2019-11-11 LAB — BASIC METABOLIC PANEL WITH GFR
BUN: 28 mg/dL — ABNORMAL HIGH (ref 6–23)
CO2: 32 meq/L (ref 19–32)
Calcium: 9.5 mg/dL (ref 8.4–10.5)
Chloride: 102 meq/L (ref 96–112)
Creatinine, Ser: 1.12 mg/dL (ref 0.40–1.50)
GFR: 64.49 mL/min
Glucose, Bld: 114 mg/dL — ABNORMAL HIGH (ref 70–99)
Potassium: 4.2 meq/L (ref 3.5–5.1)
Sodium: 141 meq/L (ref 135–145)

## 2019-11-11 MED ORDER — FUROSEMIDE 20 MG PO TABS: 40.0000 mg | ORAL_TABLET | Freq: Every day | ORAL | 0 refills | Status: DC

## 2019-12-02 ENCOUNTER — Telehealth: Payer: Self-pay | Admitting: Primary Care

## 2019-12-02 NOTE — Telephone Encounter (Signed)
Patient called requesting a walker with seat  He stated he spoke with his insurance and they will cover but patient will need a script for this  Patient would like to use [facility] medical supply    [place]

## 2019-12-03 NOTE — Telephone Encounter (Signed)
Faxed Rx to [facility] and patient have been notified.

## 2019-12-03 NOTE — Telephone Encounter (Signed)
Completed and placed in [place] inbox. 

## 2019-12-06 DIAGNOSIS — M79606 Pain in leg, unspecified: Secondary | ICD-10-CM | POA: Diagnosis not present

## 2019-12-07 NOTE — Telephone Encounter (Signed)
[place] faxed over paperwork to be completed and sign. Placed in [place]

## 2019-12-07 NOTE — Telephone Encounter (Signed)
Please notify patient that his insurance is requiring a visit with office notes to support the need for the walker. We can complete a phone visit for this since we just saw them in the office.

## 2019-12-07 NOTE — Telephone Encounter (Signed)
Pt has been scheduled for a phone call on 5/20

## 2019-12-09 ENCOUNTER — Encounter: Payer: Medicare Other | Admitting: Primary Care

## 2019-12-09 ENCOUNTER — Other Ambulatory Visit: Payer: Medicare Other

## 2019-12-09 NOTE — Progress Notes (Signed)
Called patient numerous times for appointment and he did not answer either phone number. Will attempt again tomorrow.

## 2019-12-09 NOTE — Patient Instructions (Signed)
Error

## 2019-12-17 ENCOUNTER — Other Ambulatory Visit: Payer: Self-pay | Admitting: Cardiovascular Disease

## 2019-12-30 ENCOUNTER — Other Ambulatory Visit: Payer: Self-pay

## 2019-12-30 ENCOUNTER — Encounter: Payer: Self-pay | Admitting: Primary Care

## 2019-12-30 ENCOUNTER — Ambulatory Visit (INDEPENDENT_AMBULATORY_CARE_PROVIDER_SITE_OTHER): Payer: Medicare Other | Admitting: Primary Care

## 2019-12-30 ENCOUNTER — Telehealth: Payer: Self-pay

## 2019-12-30 DIAGNOSIS — L237 Allergic contact dermatitis due to plants, except food: Secondary | ICD-10-CM | POA: Insufficient documentation

## 2019-12-30 DIAGNOSIS — L255 Unspecified contact dermatitis due to plants, except food: Secondary | ICD-10-CM

## 2019-12-30 HISTORY — DX: Allergic contact dermatitis due to plants, except food: L23.7

## 2019-12-30 MED ORDER — METHYLPREDNISOLONE ACETATE 80 MG/ML IJ SUSP
80.0000 mg | Freq: Once | INTRAMUSCULAR | Status: AC
  Administered 2019-12-30: 80 mg via INTRAMUSCULAR

## 2019-12-30 NOTE — Progress Notes (Signed)
Subjective:    Patient ID: Timothy Sheppard, male    DOB: 02-11-1946, 74 y.o.   MRN: [id number]  HPI  This visit occurred during the SARS-CoV-2 public health emergency.  Safety protocols were in place, including screening questions prior to the visit, additional usage of staff PPE, and extensive cleaning of exam room while observing appropriate contact time as indicated for disinfecting solutions.   Timothy Sheppard is a 74 year old male with a history of hypertension, neuropathy, BPH, erectile dysfunction, prediabetes who presents today with a chief complaint of rash.  He's been mowing over the last two days on a tractor, he's also been tending to some poison ivy with last contact yesterday. He did not wear glasses when mowing. This morning he woke up with itchy skin around both of his eyes and also on the penis.   He denies visual changes, pain. He took some Benadryl this morning with some improvement in swelling and itching.   Review of Systems  Eyes: Negative for visual disturbance.  Skin: Positive for rash. Negative for wound.       Past Medical History:  Diagnosis Date  . CHF (congestive heart failure) ([place])   . Chronic hip pain    Right  . Erectile dysfunction   . Hyperlipidemia   . Hypertension   . Iritis   . Neuropathy      Social History   Socioeconomic History  . Marital status: Married    Spouse name: Not on file  . Number of children: Not on file  . Years of education: Not on file  . Highest education level: Not on file  Occupational History  . Not on file  Tobacco Use  . Smoking status: Former Smoker    Types: Pipe  . Smokeless tobacco: Never Used  . Tobacco comment: Quit 1 mo ago  Vaping Use  . Vaping Use: Never used  Substance and Sexual Activity  . Alcohol use: No  . Drug use: No  . Sexual activity: Not on file  Other Topics Concern  . Not on file  Social History Narrative   Married.   2 children, 2 grandchildren, 2 great grandchilden.    Retired.    Social Determinants of Health   Financial Resource Strain: Low Risk   . Difficulty of Paying Living Expenses: Not hard at all  Food Insecurity: No Food Insecurity  . Worried About Charity fundraiser in the Last Year: Never true  . Ran Out of Food in the Last Year: Never true  Transportation Needs: No Transportation Needs  . Lack of Transportation (Medical): No  . Lack of Transportation (Non-Medical): No  Physical Activity: Inactive  . Days of Exercise per Week: 0 days  . Minutes of Exercise per Session: 0 min  Stress: No Stress Concern Present  . Feeling of Stress : Not at all  Social Connections:   . Frequency of Communication with Friends and Family:   . Frequency of Social Gatherings with Friends and Family:   . Attends Religious Services:   . Active Member of Clubs or Organizations:   . Attends Archivist Meetings:   Marland Kitchen Marital Status:   Intimate Partner Violence: Not At Risk  . Fear of Current or Ex-Partner: No  . Emotionally Abused: No  . Physically Abused: No  . Sexually Abused: No    Past Surgical History:  Procedure Laterality Date  . BACK SURGERY  2000  . HERNIA REPAIR    .  ORIF FEMUR FRACTURE Right 1967   rod insertion  . TONSILLECTOMY AND ADENOIDECTOMY      Family History  Problem Relation Age of Onset  . Heart failure Mother   . Lupus Mother   . Heart failure Father     No Known Allergies  Current Outpatient Medications on File Prior to Visit  Medication Sig Dispense Refill  . aspirin 81 MG tablet Take 81 mg by mouth daily.    . furosemide (LASIX) 20 MG tablet Take 2 tablets (40 mg total) by mouth daily. For leg swelling. 180 tablet 0  . gabapentin (NEURONTIN) 300 MG capsule TAKE 1 CAPSULE BY MOUTH EVERY MORNING AND TAKE 3 CAPSULES AT BEDTIME FOR NEUROPATHY 360 capsule 1  . meloxicam (MOBIC) 15 MG tablet Take 1 tablet (15 mg total) by mouth daily. As needed for pain. 90 tablet 0  . sildenafil (VIAGRA) 100 MG tablet TAKE ONE  TABLET BY MOUTH 30 MINUTES PRIOR TO INTERCOURSE 30 tablet 0  . tamsulosin (FLOMAX) 0.4 MG CAPS capsule TAKE 1 CAPSULE BY MOUTH IN THE MORNING FOR  URINE  STREAM 90 capsule 1   No current facility-administered medications on file prior to visit.    BP 132/80   Pulse 60   Temp (!) 95.1 F (35.1 C) (Temporal)   Ht 5\' 11"  (1.803 m)   Wt 272 lb 4 oz (123.5 kg)   SpO2 98%   BMI 37.97 kg/m    Objective:   Physical Exam  Cardiovascular: Normal rate.  Respiratory: Effort normal.  Skin: Skin is warm and dry. Rash noted.  Mild redness around bilateral eyes with mild swelling. Slight rash to bilateral temporal region of head. No vesicles.            Assessment & Plan:

## 2019-12-30 NOTE — Patient Instructions (Signed)
Please update me Monday if no improvement.  Try taking Zyrtec, Claritin, or Allegra rather than Benadryl.  It was a pleasure to see you today!

## 2019-12-30 NOTE — Addendum Note (Signed)
Addended by: Jacqualin Combes on: 12/30/2019 10:09 AM   Modules accepted: Orders

## 2019-12-30 NOTE — Assessment & Plan Note (Signed)
Early case of poison ivy dermatitis suspected.  No evidence of shingles, especially given bilateral presentation. Suspect itching to penis due to him using the bathroom just after mowing.   Overall stable, mild and early case. IM Depo medrol 80 mg provided. He will update in a few days. Discussed use of antihistamine.

## 2019-12-30 NOTE — Telephone Encounter (Signed)
Mrs [place] [facility] signed) said pt was outside yesterday and this morning he has poison oak or ivy in both eyes., both eyes are swollen. Pt took benadryl but has not helped and has ice on eyes for itching and swelling. Pt scheduled in office appt today at 9:20 with Gentry Fitz NP. Pt has no covid symptoms, no travel and no known exposure to + covid.

## 2019-12-30 NOTE — Telephone Encounter (Signed)
Patient evaluated.  

## 2020-01-03 MED ORDER — PREDNISONE 20 MG PO TABS: ORAL_TABLET | ORAL | 0 refills | Status: DC

## 2020-01-03 NOTE — Telephone Encounter (Signed)
Please notify patient that we can try oral prednisone, this is a steroid similar to the shot we provided in the office.  I'll send this to his pharmacy now. Take 2 tablets for four days, then 1 tablet for four days.

## 2020-01-03 NOTE — Addendum Note (Signed)
Addended by: [name] on: 01/03/2020 04:28 PM   Modules accepted: Orders

## 2020-01-03 NOTE — Telephone Encounter (Signed)
Patient's wife called. She stated the patient was in to see you last week for the poison oak in his eyes.  She said that it is drying up on his forehead but he is still having trouble with his eyes and eye lids. He was not sure if he needed to make another appointment to come in or if there is something else he could do ,.

## 2020-01-04 NOTE — Telephone Encounter (Signed)
Spoken and notified patient of Kate Clark's comments. Verbalized understanding. ° °

## 2020-01-04 NOTE — Telephone Encounter (Signed)
Message left for patient to return my call.  

## 2020-02-02 ENCOUNTER — Other Ambulatory Visit: Payer: Self-pay | Admitting: Primary Care

## 2020-02-02 DIAGNOSIS — R609 Edema, unspecified: Secondary | ICD-10-CM

## 2020-02-02 NOTE — Telephone Encounter (Signed)
Refills sent to pharmacy. 

## 2020-02-02 NOTE — Telephone Encounter (Signed)
Last prescribed on 11/11/2019 Last OV (acute ) with Allie Bossier on  12/30/2019 No future OV scheduled

## 2020-02-16 ENCOUNTER — Other Ambulatory Visit: Payer: Self-pay | Admitting: Primary Care

## 2020-02-16 DIAGNOSIS — G609 Hereditary and idiopathic neuropathy, unspecified: Secondary | ICD-10-CM

## 2020-02-18 ENCOUNTER — Telehealth: Payer: Self-pay

## 2020-02-18 DIAGNOSIS — G609 Hereditary and idiopathic neuropathy, unspecified: Secondary | ICD-10-CM

## 2020-02-18 NOTE — Telephone Encounter (Signed)
Pt said not sleeping well at night due to neuropathy in feet. Pt said sleeps about 4 - 5 hrs per night. Pt said taking gabapentin 300 mg one cap in AM and 3 caps at hs as instructed. Pt said has discussed with Anda Kraft earlier and was advised that Anda Kraft could increase the gabapentin. Pt would like to try taking 2 gabapentin 300 mg in AM, 2 gabapentin in evening and 2 gabapentin 300 mg when goes to bed around 2 AM. Pt request cb after Anda Kraft reviews note and pt said if cb next wk that would be OK.[facility] [place].

## 2020-02-18 NOTE — Telephone Encounter (Signed)
If his neuropathy pain is worse at night, then I recommend we start with 300 mg in AM, 600 mg in afternoon, 600 mg at bedtime. Let me know if he's agreeable and then I'll update his medication.

## 2020-02-22 MED ORDER — GABAPENTIN 300 MG PO CAPS: ORAL_CAPSULE | ORAL | 0 refills | Status: DC

## 2020-02-22 NOTE — Telephone Encounter (Signed)
Spoken to patient yesterday afternoon and he is agreeable, [facility]

## 2020-02-22 NOTE — Telephone Encounter (Signed)
We just sent a refill on 02/17/20, so I'll tweak the Rx in our system, have him update [place] in 2 weeks. We can always switch to the 600 mg dose at that time.

## 2020-02-22 NOTE — Telephone Encounter (Signed)
Message left for patient to return my call on home number. Try calling patient on mobile number 2 times today.

## 2020-02-23 NOTE — Telephone Encounter (Signed)
Spoken and notified patient of Kate Clark's comments. Verbalized understanding. ° °

## 2020-03-29 ENCOUNTER — Other Ambulatory Visit: Payer: Self-pay

## 2020-03-29 ENCOUNTER — Other Ambulatory Visit: Payer: Medicare Other

## 2020-03-29 DIAGNOSIS — Z20822 Contact with and (suspected) exposure to covid-19: Secondary | ICD-10-CM | POA: Diagnosis not present

## 2020-03-31 ENCOUNTER — Other Ambulatory Visit: Payer: Self-pay

## 2020-03-31 ENCOUNTER — Other Ambulatory Visit: Payer: Self-pay | Admitting: Primary Care

## 2020-03-31 ENCOUNTER — Telehealth (INDEPENDENT_AMBULATORY_CARE_PROVIDER_SITE_OTHER): Payer: Medicare Other | Admitting: Primary Care

## 2020-03-31 DIAGNOSIS — U071 COVID-19: Secondary | ICD-10-CM

## 2020-03-31 HISTORY — DX: COVID-19: U07.1

## 2020-03-31 LAB — NOVEL CORONAVIRUS, NAA: SARS-CoV-2, NAA: DETECTED — AB

## 2020-03-31 LAB — SARS-COV-2, NAA 2 DAY TAT

## 2020-03-31 NOTE — Progress Notes (Signed)
I connected by phone with Timothy Sheppard on 03/31/2020 at 1:05 PM to discuss the potential use of a new treatment for mild to moderate COVID-19 viral infection in non-hospitalized patients.  This patient is a 74 y.o. male that meets the FDA criteria for Emergency Use Authorization of COVID monoclonal antibody casirivimab/imdevimab.  Has a (+) direct SARS-CoV-2 viral test result  Has mild or moderate COVID-19   Is NOT hospitalized due to COVID-19  Is within 10 days of symptom onset  Has at least one of the high risk factor(s) for progression to severe COVID-19 and/or hospitalization as defined in EUA.  Specific high risk criteria : Older age (>/= 72 yo), BMI > 25 and Cardiovascular disease or hypertension   I have spoken and communicated the following to the patient or parent/caregiver regarding COVID monoclonal antibody treatment:  1. FDA has authorized the emergency use for the treatment of mild to moderate COVID-19 in adults and pediatric patients with positive results of direct SARS-CoV-2 viral testing who are 69 years of age and older weighing at least 40 kg, and who are at high risk for progressing to severe COVID-19 and/or hospitalization.  2. The significant known and potential risks and benefits of COVID monoclonal antibody, and the extent to which such potential risks and benefits are unknown.  3. Information on available alternative treatments and the risks and benefits of those alternatives, including clinical trials.  4. Patients treated with COVID monoclonal antibody should continue to self-isolate and use infection control measures (e.g., wear mask, isolate, social distance, avoid sharing personal items, clean and disinfect "high touch" surfaces, and frequent handwashing) according to CDC guidelines.   5. The patient or parent/caregiver has the option to accept or refuse COVID monoclonal antibody treatment.  After reviewing this information with the patient, The patient  agreed to proceed with receiving casirivimab\imdevimab infusion and will be provided a copy of the Fact sheet prior to receiving the infusion. [name] 03/31/2020 1:05 PM

## 2020-03-31 NOTE — Patient Instructions (Signed)
Hello Timothy Sheppard,   You have been scheduled to receive Regeneron (the monoclonal antibody we discussed) on : 04/01/20 At 8:30 am.  If you have been tested outside of a [facility] - you MUST bring a copy of your positive test with you the morning of your appointment. You may take a photo of this and upload to your MyChart portal or have the testing facility fax the result to [phone]    The address for the infusion clinic site is:  --GPS address is [place] - the parking is located near [employer] building where you will see  COVID19 Infusion feather banner marking the entrance to parking.   (see photos below)            --Enter into the 2nd entrance where the "wave, flag banner" is at the road. Turn into this 2nd entrance and immediately turn left to park in 1 of the 5 parking spots.   --Please stay in your car and call the desk for assistance inside [phone].   --Average time in department is roughly 2 hours for Regeneron treatment - this includes preparation of the medication, IV start and the required 1 hour monitoring after the infusion.    Should you develop worsening shortness of breath, chest pain or severe breathing problems please do not wait for this appointment and go to the Emergency room for evaluation and treatment. You will undergo another oxygen screen before your infusion to ensure this is the best treatment option for you. There is a chance that the best decision may be to send you to the Emergency Room for evaluation at the time of your appointment.   The day of your visit you should: Marland Kitchen Get plenty of rest the night before and drink plenty of water . Eat a light meal/snack before coming and take your medications as prescribed  . Wear warm, comfortable clothes with a shirt that can roll-up over the elbow (will need IV start).  . Wear a mask  . Consider bringing some activity to help pass the time  Many commercial insurers are  waiving bills related to [place] treatment however some have ranged from $300-640. We are starting to see some insurers send bills to patients later for the administration of the medication - we are learning more information but you may receive a bill after your appointment.  Please contact your insurance agent to discuss prior to your appointment if you would like further details about billing specific to your policy.    I hope this helps find you feeling better,  Timothy Sheppard

## 2020-03-31 NOTE — Assessment & Plan Note (Signed)
Positive for COVID-19 as of 03/29/2020 with symptom onset of 03/24/2020.  Appears to be struggling with symptoms of COVID-19 despite conservative treatment at home.  He does qualify for monoclonal antibody infusion, and will be scheduled for 8:30 AM on 04/01/2020.  We'll place orders. Strict ED precautions provided in the meantime.

## 2020-03-31 NOTE — Progress Notes (Signed)
Subjective:    Patient ID: Timothy Sheppard, male    DOB: 07/19/1946, 74 y.o.   MRN: [id number]  HPI     Timothy Sheppard - 74 y.o. male   MRN [id number]   Date of Birth: 08/27/1945  PCP: [name], NP  This service was provided via telemedicine. Phone Visit performed on 03/31/2020    Rationale for phone visit along with limitations reviewed. I discussed the limitations, risks, security and privacy concerns of performing a phone visit and the availability of in person appointments. I also discussed with the patient that there may be a patient responsible charge related to this service. Patient consented to telephone encounter.    Location of patient: Home Location of provider: Office at [employer] @ [facility] Name of referring provider: N/A Participants: Patient and myself   Names of persons and role in encounter: Provider: [name], NP  Patient: Timothy Sheppard  Other: N/A   Time on call: 11 min - 03 sec   Subjective: Chief Complaint  Patient presents with   URI     HPI:  Mr. Wehner is a 74 year old male with a history of hypertension, CHF, allergic rhinitis, prediabetes who presents today with a chief complaint of cough.  He also reports loss of taste and smell, cough, body aches. He was exposed to Covid-19 by his daughter about 8 days ago, he developed symptoms the following day. He was tested two days ago for Covid-19 and is unaware of his results.   He denies fevers, but is feeling poorly.   Objective/Observations:  No physical exam or vital signs collected unless specifically identified below.   There were no vitals taken for this visit.   Respiratory status: speaks in complete sentences without evident shortness of breath.   Assessment/Plan:  Covid-19 positive as evidenced in Epic, notified patient of results. Given his BMI, age, and medical history he qualifies for monoclonal antibody treatment.  Orders placed for treatment. He has  been scheduled for 08:30 am on 04/01/20. ED precautions provided in the mean time.  No problem-specific Assessment & Plan notes found for this encounter.   I discussed the assessment and treatment plan with the patient. The patient was provided an opportunity to ask questions and all were answered. The patient agreed with the plan and demonstrated an understanding of the instructions.  Lab Orders  No laboratory test(s) ordered today    No orders of the defined types were placed in this encounter.   The patient was advised to call back or seek an in-person evaluation if the symptoms worsen or if the condition fails to improve as anticipated.  [name], NP    Review of Systems  Constitutional: Positive for chills and fatigue. Negative for fever.  HENT:       Loss of taste and smell  Respiratory: Positive for cough.   Gastrointestinal: Negative for diarrhea.  Musculoskeletal: Positive for arthralgias and myalgias.       Past Medical History:  Diagnosis Date   CHF (congestive heart failure) ([facility])    Chronic hip pain    Right   Erectile dysfunction    Hyperlipidemia    Hypertension    Iritis    Neuropathy      Social History   Socioeconomic History   Marital status: Married    Spouse name: Not on file   Number of children: Not on file   Years of education: Not on file  Highest education level: Not on file  Occupational History   Not on file  Tobacco Use   Smoking status: Former Smoker    Types: Pipe   Smokeless tobacco: Never Used   Tobacco comment: Quit 1 mo ago  Vaping Use   Vaping Use: Never used  Substance and Sexual Activity   Alcohol use: No   Drug use: No   Sexual activity: Not on file  Other Topics Concern   Not on file  Social History Narrative   Married.   2 children, 2 grandchildren, 2 great grandchilden.   Retired.    Social Determinants of Health   Financial Resource Strain: Low Risk    Difficulty of  Paying Living Expenses: Not hard at all  Food Insecurity: No Food Insecurity   Worried About Charity fundraiser in the Last Year: Never true   [place] in the Last Year: Never true  Transportation Needs: No Transportation Needs   Lack of Transportation (Medical): No   Lack of Transportation (Non-Medical): No  Physical Activity: Inactive   Days of Exercise per Week: 0 days   Minutes of Exercise per Session: 0 min  Stress: No Stress Concern Present   Feeling of Stress : Not at all  Social Connections:    Frequency of Communication with Friends and Family: Not on file   Frequency of Social Gatherings with Friends and Family: Not on file   Attends Religious Services: Not on Electrical engineer or Organizations: Not on file   Attends Archivist Meetings: Not on file   Marital Status: Not on file  Intimate Partner Violence: Not At Risk   Fear of Current or Ex-Partner: No   Emotionally Abused: No   Physically Abused: No   Sexually Abused: No    Past Surgical History:  Procedure Laterality Date   BACK SURGERY  2000   HERNIA REPAIR     ORIF FEMUR FRACTURE Right 1967   rod insertion   TONSILLECTOMY AND ADENOIDECTOMY      Family History  Problem Relation Age of Onset   Heart failure Mother    Lupus Mother    Heart failure Father     No Known Allergies  Current Outpatient Medications on File Prior to Visit  Medication Sig Dispense Refill   aspirin 81 MG tablet Take 81 mg by mouth daily.     furosemide (LASIX) 20 MG tablet TAKE 2 TABLETS (40 MG TOTAL) BY MOUTH DAILY. FOR LEG SWELLING. 180 tablet 0   gabapentin (NEURONTIN) 300 MG capsule Take 1 capsule by mouth in the morning, 2 capsules in the afternoon, 2 capsules in the evening. 150 capsule 0   meloxicam (MOBIC) 15 MG tablet Take 1 tablet (15 mg total) by mouth daily. As needed for pain. 90 tablet 0   predniSONE (DELTASONE) 20 MG tablet Take 2 tablets by mouth daily for  four days, then 1 tablet once daily for four days. 12 tablet 0   sildenafil (VIAGRA) 100 MG tablet TAKE ONE TABLET BY MOUTH 30 MINUTES PRIOR TO INTERCOURSE 30 tablet 0   tamsulosin (FLOMAX) 0.4 MG CAPS capsule TAKE 1 CAPSULE BY MOUTH IN THE MORNING FOR  URINE  STREAM 90 capsule 1   No current facility-administered medications on file prior to visit.    There were no vitals taken for this visit.   Objective:   Physical Exam Constitutional:      General: He is not in acute  distress. Pulmonary:     Effort: Pulmonary effort is normal.     Comments: No cough during visit Neurological:     Mental Status: He is alert and oriented to person, place, and time.            Assessment & Plan:

## 2020-04-01 ENCOUNTER — Ambulatory Visit (HOSPITAL_COMMUNITY)
Admission: RE | Admit: 2020-04-01 | Discharge: 2020-04-01 | Disposition: A | Discharge status: Home / Self Care | Payer: Medicare Other | Source: Ambulatory Visit | Attending: Pulmonary Disease | Admitting: Pulmonary Disease

## 2020-04-01 DIAGNOSIS — U071 COVID-19: Secondary | ICD-10-CM | POA: Diagnosis present

## 2020-04-01 DIAGNOSIS — Z23 Encounter for immunization: Secondary | ICD-10-CM | POA: Insufficient documentation

## 2020-04-01 MED ORDER — DIPHENHYDRAMINE HCL 50 MG/ML IJ SOLN: 50.0000 mg | Freq: Once | INTRAMUSCULAR | Status: DC | PRN

## 2020-04-01 MED ORDER — FAMOTIDINE IN NACL 20-0.9 MG/50ML-% IV SOLN: 20.0000 mg | Freq: Once | INTRAVENOUS | Status: DC | PRN

## 2020-04-01 MED ORDER — EPINEPHRINE 0.3 MG/0.3ML IJ SOAJ: 0.3000 mg | Freq: Once | INTRAMUSCULAR | Status: DC | PRN

## 2020-04-01 MED ORDER — SODIUM CHLORIDE 0.9 % IV SOLN
1200.0000 mg | Freq: Once | INTRAVENOUS | Status: AC
  Administered 2020-04-01: 1200 mg via INTRAVENOUS
  Filled 2020-04-01: qty 10

## 2020-04-01 MED ORDER — SODIUM CHLORIDE 0.9 % IV SOLN: INTRAVENOUS | Status: DC | PRN

## 2020-04-01 MED ORDER — METHYLPREDNISOLONE SODIUM SUCC 125 MG IJ SOLR: 125.0000 mg | Freq: Once | INTRAMUSCULAR | Status: DC | PRN

## 2020-04-01 MED ORDER — ALBUTEROL SULFATE HFA 108 (90 BASE) MCG/ACT IN AERS: 2.0000 | INHALATION_SPRAY | Freq: Once | RESPIRATORY_TRACT | Status: DC | PRN

## 2020-04-01 NOTE — Progress Notes (Signed)
  Diagnosis: COVID-19  Physician: Dr. Joya Gaskins  Procedure: Covid Infusion Clinic Med: casirivimab\imdevimab infusion - Provided patient with casirivimab\imdevimab fact sheet for patients, parents and caregivers prior to infusion.  Complications: No immediate complications noted.  Discharge: Discharged home   Cheri Guppy 04/01/2020

## 2020-04-01 NOTE — Discharge Instructions (Signed)

## 2020-04-03 ENCOUNTER — Telehealth: Payer: Self-pay

## 2020-04-03 ENCOUNTER — Other Ambulatory Visit: Payer: Self-pay | Admitting: Primary Care

## 2020-04-03 DIAGNOSIS — N401 Enlarged prostate with lower urinary tract symptoms: Secondary | ICD-10-CM

## 2020-04-03 DIAGNOSIS — U071 COVID-19: Secondary | ICD-10-CM

## 2020-04-03 MED ORDER — GUAIFENESIN-CODEINE 100-10 MG/5ML PO SYRP: 5.0000 mL | ORAL_SOLUTION | Freq: Three times a day (TID) | ORAL | 0 refills | Status: DC | PRN

## 2020-04-03 NOTE — Telephone Encounter (Signed)
Noted, Rx sent to pharmacy. 

## 2020-04-03 NOTE — Telephone Encounter (Signed)
Is he requesting cough medication with codeine? Has he ever taken this before? How has he done with this in the past?

## 2020-04-03 NOTE — Telephone Encounter (Signed)
Patient contacted the office and states he was dx with COVID on 03/22/20. He states he had an the infusion this past Saturday 04/01/20. He states he has a very productive cough, body aches and a sore throat. He denies any fever or shortness of breath. He states he does feel a little winded after a coughing spell, but feels ok once it has calmed down. He states the coughing is worse at night, and he would like a prescription for a cough medication, so he can get some rest.  Please advise.

## 2020-04-03 NOTE — Addendum Note (Signed)
Addended by: [name] on: 04/03/2020 01:56 PM   Modules accepted: Orders

## 2020-04-03 NOTE — Telephone Encounter (Signed)
Called patient has taken in the past with no issues [id number] [place] [place]

## 2020-04-05 ENCOUNTER — Telehealth: Payer: Self-pay | Admitting: *Deleted

## 2020-04-05 DIAGNOSIS — U071 COVID-19: Secondary | ICD-10-CM

## 2020-04-05 DIAGNOSIS — R110 Nausea: Secondary | ICD-10-CM

## 2020-04-05 NOTE — Telephone Encounter (Signed)
Patient's wife called stated that her husband has been nauseated and needs something for this. Patient got on the phone and stated that he has been nauseated off and on since being diagnosed with covid,. Patient stated that he has not had any vomiting. Patient denies any SOB and stated that his breathing is good. Patient stated the only problem that he is having now is the nausea. Patient requested something to help with the nausea. [facility]

## 2020-04-06 MED ORDER — ONDANSETRON 4 MG PO TBDP: 4.0000 mg | ORAL_TABLET | Freq: Three times a day (TID) | ORAL | 0 refills | Status: DC | PRN

## 2020-04-06 NOTE — Telephone Encounter (Signed)
Please notify patient that I am sorry to hear about his nausea, I will send in some ondansetron (Zofran) orally disintegrating tablets to the pharmacy.  He can use these every 8 hours as needed for nausea.

## 2020-04-06 NOTE — Telephone Encounter (Signed)
Called patient reviewed all information and repeated back to me. Will call if any questions. Will pick up meds and give try.

## 2020-04-10 ENCOUNTER — Other Ambulatory Visit: Payer: Self-pay

## 2020-04-10 ENCOUNTER — Other Ambulatory Visit: Payer: Medicare Other

## 2020-04-10 DIAGNOSIS — Z20822 Contact with and (suspected) exposure to covid-19: Secondary | ICD-10-CM | POA: Diagnosis not present

## 2020-04-11 LAB — NOVEL CORONAVIRUS, NAA: SARS-CoV-2, NAA: NOT DETECTED

## 2020-04-11 LAB — SARS-COV-2, NAA 2 DAY TAT

## 2020-04-30 ENCOUNTER — Other Ambulatory Visit: Payer: Self-pay | Admitting: Primary Care

## 2020-04-30 DIAGNOSIS — R609 Edema, unspecified: Secondary | ICD-10-CM

## 2020-04-30 DIAGNOSIS — R600 Localized edema: Secondary | ICD-10-CM

## 2020-05-03 ENCOUNTER — Telehealth: Payer: Self-pay

## 2020-05-03 ENCOUNTER — Ambulatory Visit (INDEPENDENT_AMBULATORY_CARE_PROVIDER_SITE_OTHER): Payer: Medicare Other

## 2020-05-03 ENCOUNTER — Other Ambulatory Visit: Payer: Self-pay

## 2020-05-03 DIAGNOSIS — Z23 Encounter for immunization: Secondary | ICD-10-CM

## 2020-05-03 NOTE — Telephone Encounter (Signed)
Per immunization record pt got flu shot earlier today.

## 2020-05-03 NOTE — Telephone Encounter (Signed)
[place] Night - Client Nonclinical Telephone Record AccessNurse Client [place] Night - Client Client Site [place] - Night Contact Type Call Who Is Calling Patient / Member / Family / Caregiver Caller Name Timothy Sheppard Phone Number [phone] Patient Name Timothy Sheppard Patient DOB [name] 10-20-1945-- [place] Setting 10/24/1948 Call Type Message Only Information Provided Reason for Call Request for General Office Information Initial Comment PT says they have flu shot at 0700 but no one is there. Disp. Time Disposition Final User 05/03/2020 7:01:44 AM General Information Provided Yes Silvano Rusk Call Closed By: Silvano Rusk Transaction Date/Time: 05/03/2020 6:58:55 AM (ET)

## 2020-05-08 ENCOUNTER — Emergency Department: Payer: Medicare Other

## 2020-05-08 ENCOUNTER — Other Ambulatory Visit: Payer: Self-pay

## 2020-05-08 ENCOUNTER — Telehealth: Payer: Self-pay

## 2020-05-08 ENCOUNTER — Emergency Department
Admission: EM | Admit: 2020-05-08 | Discharge: 2020-05-08 | Disposition: A | Discharge status: Home / Self Care | Payer: Medicare Other | Attending: Emergency Medicine | Admitting: Emergency Medicine

## 2020-05-08 ENCOUNTER — Encounter: Payer: Self-pay | Admitting: Emergency Medicine

## 2020-05-08 DIAGNOSIS — R531 Weakness: Secondary | ICD-10-CM | POA: Insufficient documentation

## 2020-05-08 DIAGNOSIS — R110 Nausea: Secondary | ICD-10-CM | POA: Diagnosis not present

## 2020-05-08 DIAGNOSIS — R296 Repeated falls: Secondary | ICD-10-CM | POA: Diagnosis not present

## 2020-05-08 DIAGNOSIS — R112 Nausea with vomiting, unspecified: Secondary | ICD-10-CM

## 2020-05-08 DIAGNOSIS — E860 Dehydration: Secondary | ICD-10-CM

## 2020-05-08 DIAGNOSIS — I110 Hypertensive heart disease with heart failure: Secondary | ICD-10-CM | POA: Diagnosis not present

## 2020-05-08 DIAGNOSIS — Z87891 Personal history of nicotine dependence: Secondary | ICD-10-CM | POA: Insufficient documentation

## 2020-05-08 DIAGNOSIS — Z7982 Long term (current) use of aspirin: Secondary | ICD-10-CM | POA: Diagnosis not present

## 2020-05-08 DIAGNOSIS — I509 Heart failure, unspecified: Secondary | ICD-10-CM | POA: Diagnosis not present

## 2020-05-08 DIAGNOSIS — R197 Diarrhea, unspecified: Secondary | ICD-10-CM | POA: Diagnosis not present

## 2020-05-08 DIAGNOSIS — Z8616 Personal history of COVID-19: Secondary | ICD-10-CM | POA: Insufficient documentation

## 2020-05-08 DIAGNOSIS — M419 Scoliosis, unspecified: Secondary | ICD-10-CM | POA: Diagnosis not present

## 2020-05-08 DIAGNOSIS — M2578 Osteophyte, vertebrae: Secondary | ICD-10-CM | POA: Diagnosis not present

## 2020-05-08 DIAGNOSIS — S0990XA Unspecified injury of head, initial encounter: Secondary | ICD-10-CM | POA: Diagnosis not present

## 2020-05-08 DIAGNOSIS — M47812 Spondylosis without myelopathy or radiculopathy, cervical region: Secondary | ICD-10-CM | POA: Diagnosis not present

## 2020-05-08 DIAGNOSIS — I10 Essential (primary) hypertension: Secondary | ICD-10-CM | POA: Diagnosis not present

## 2020-05-08 DIAGNOSIS — M4802 Spinal stenosis, cervical region: Secondary | ICD-10-CM | POA: Diagnosis not present

## 2020-05-08 LAB — URINALYSIS, COMPLETE (UACMP) WITH MICROSCOPIC
Bacteria, UA: NONE SEEN
Bilirubin Urine: NEGATIVE
Glucose, UA: NEGATIVE mg/dL
Hgb urine dipstick: NEGATIVE
Ketones, ur: 5 mg/dL — AB
Leukocytes,Ua: NEGATIVE
Nitrite: NEGATIVE
Protein, ur: NEGATIVE mg/dL
Specific Gravity, Urine: 1.021 (ref 1.005–1.030)
Squamous Epithelial / HPF: NONE SEEN (ref 0–5)
pH: 5 (ref 5.0–8.0)

## 2020-05-08 LAB — COMPREHENSIVE METABOLIC PANEL
ALT: 19 U/L (ref 0–44)
AST: 26 U/L (ref 15–41)
Albumin: 4.1 g/dL (ref 3.5–5.0)
Alkaline Phosphatase: 44 U/L (ref 38–126)
Anion gap: 14 (ref 5–15)
BUN: 25 mg/dL — ABNORMAL HIGH (ref 8–23)
CO2: 22 mmol/L (ref 22–32)
Calcium: 8.7 mg/dL — ABNORMAL LOW (ref 8.9–10.3)
Chloride: 100 mmol/L (ref 98–111)
Creatinine, Ser: 1.08 mg/dL (ref 0.61–1.24)
GFR, Estimated: >60 mL/min (ref >60)
Glucose, Bld: 126 mg/dL — ABNORMAL HIGH (ref 70–99)
Potassium: 3.1 mmol/L — ABNORMAL LOW (ref 3.5–5.1)
Sodium: 136 mmol/L (ref 135–145)
Total Bilirubin: 1.3 mg/dL — ABNORMAL HIGH (ref 0.3–1.2)
Total Protein: 7.5 g/dL (ref 6.5–8.1)

## 2020-05-08 LAB — CBC
HCT: 45.4 % (ref 39.0–52.0)
Hemoglobin: 16 g/dL (ref 13.0–17.0)
MCH: 31.7 pg (ref 26.0–34.0)
MCHC: 35.2 g/dL (ref 30.0–36.0)
MCV: 90.1 fL (ref 80.0–100.0)
Platelets: 209 10*3/uL (ref 150–400)
RBC: 5.04 MIL/uL (ref 4.22–5.81)
RDW: 13.2 % (ref 11.5–15.5)
WBC: 13.2 10*3/uL — ABNORMAL HIGH (ref 4.0–10.5)
nRBC: 0 % (ref 0.0–0.2)

## 2020-05-08 LAB — TROPONIN I (HIGH SENSITIVITY): Troponin I (High Sensitivity): 7 ng/L (ref <18)

## 2020-05-08 LAB — LIPASE, BLOOD: Lipase: 22 U/L (ref 11–51)

## 2020-05-08 MED ORDER — SODIUM CHLORIDE 0.9 % IV BOLUS
1000.0000 mL | Freq: Once | INTRAVENOUS | Status: AC
  Administered 2020-05-08: 1000 mL via INTRAVENOUS

## 2020-05-08 MED ORDER — ONDANSETRON HCL 4 MG/2ML IJ SOLN
4.0000 mg | Freq: Once | INTRAMUSCULAR | Status: AC
  Administered 2020-05-08: 4 mg via INTRAVENOUS

## 2020-05-08 MED ORDER — LOPERAMIDE HCL 2 MG PO TABS: 2.0000 mg | ORAL_TABLET | Freq: Four times a day (QID) | ORAL | 0 refills | Status: DC | PRN

## 2020-05-08 MED ORDER — ONDANSETRON HCL 4 MG/2ML IJ SOLN
4.0000 mg | Freq: Once | INTRAMUSCULAR | Status: AC | PRN
  Administered 2020-05-08: 4 mg via INTRAVENOUS
  Filled 2020-05-08 (×2): qty 2

## 2020-05-08 MED ORDER — ONDANSETRON 4 MG PO TBDP: 4.0000 mg | ORAL_TABLET | Freq: Three times a day (TID) | ORAL | 0 refills | Status: DC | PRN

## 2020-05-08 MED ORDER — LOPERAMIDE HCL 2 MG PO CAPS
4.0000 mg | ORAL_CAPSULE | Freq: Once | ORAL | Status: AC
  Administered 2020-05-08: 4 mg via ORAL
  Filled 2020-05-08: qty 2

## 2020-05-08 MED ORDER — ONDANSETRON HCL 4 MG/2ML IJ SOLN
INTRAMUSCULAR | Status: AC
  Filled 2020-05-08: qty 2

## 2020-05-08 NOTE — Discharge Instructions (Signed)
As we discussed please take your medications as needed, as prescribed.  Please drink plenty of fluids and obtain plenty of rest.  Return to the emergency department for any further significant nausea vomiting or diarrhea any signs or concerns for dehydration, or any other symptom personally concerning to yourself.

## 2020-05-08 NOTE — Telephone Encounter (Signed)
Timothy Sheppard - Client TELEPHONE ADVICE RECORD AccessNurse Patient Name: Timothy Sheppard Gender: Male DOB: 08/20/1945 Age: 74 Y 2 M 29 D Return Phone Number: [phone] (Primary) Address: City/State/Zip: [place] [address] Client [place]ard Sheppard - Client Client Site [facility] - Sheppard Physician Alma Friendly - NP Contact Type Call Who Is Calling Patient / Member / Family / Caregiver Call Type Triage / Clinical Caller Name Kent Relationship To Patient Spouse Return Phone Number [phone] (Primary) Chief Complaint HEAD INJURY - and not acting right. Change in behaviour after hitting head. Reason for Call Symptomatic / Request for Health Information Initial Comment Caller states they're vomiting, diarrhea, passing out, hit head on commode, Friday, 2022/10/29 and 10-30-22 has passed out and hit his head. Had his flu shot on Wed. Translation No Nurse Assessment Nurse: [name], RN, Baker Janus Date/Time [name] Time): 05/08/2020 9:22:53 AM Confirm and document reason for call. If symptomatic, describe symptoms. ---[name] received flu shot on Wednesday; and started with vomiting diarrhea on October 29, 2022 with balance being off. 10/30/2022 fell twice and one time on Oct 29, 2022; struck his head on the commode -right arm has a dark bruise with skin tear also has knot on forehead, but cut above left ear and fell again last night breaking open his wound above left ear. the V/D is still going on Does the patient have any new or worsening symptoms? ---Yes Will a triage be completed? ---Yes Related visit to physician within the last 2 weeks? ---No Does the PT have any chronic conditions? (i.e. diabetes, asthma, this includes High risk factors for pregnancy, etc.) ---Yes List chronic conditions. ---Neuropathy Is this a behavioral health or substance abuse call? ---No Guidelines Guideline Title Affirmed Question Affirmed Notes  Nurse Date/Time (Eastern Time) Head Injury [1] Age over 71 years AND [2] swelling or bruise [name] 05/08/2020 9:28:22 AM Disp. Time [name] Time) Disposition Final User 05/08/2020 9:19:51 AM Send to Urgent [name], [name] PLEASE NOTE: All timestamps contained within this report are represented as [place] Standard Time. CONFIDENTIALTY NOTICE: This fax transmission is intended only for the addressee. It contains information that is legally privileged, confidential or otherwise protected from use or disclosure. If you are not the intended recipient, you are strictly prohibited from reviewing, disclosing, copying using or disseminating any of this information or taking any action in reliance on or regarding this information. If you have received this fax in error, please notify us immediately by telephone so that we can arrange for its return to [place]. Phone: [phone], Toll-Free: [phone], Fax: [phone] Page: 2 of 2 Call Id: [id number] 05/08/2020 9:30:23 AM See HCP within 4 Hours (or PCP triage) Yes [name], RN, [name] Disagree/Comply Comply Caller Understands Yes PreDisposition Call Doctor Care Advice Given Per Guideline SEE HCP (OR PCP TRIAGE) WITHIN 4 HOURS: CALL BACK IF: * You become worse CARE ADVICE given per Head Injury (Adult) guideline. Comments User: [name] Date/Time [name] Time): 05/08/2020 9:19:33 AM Caller transferred from office. User: [name], RN Date/Time [name] Time): 05/08/2020 9:43:36 AM RN NOTE; called back line and they do not have any available appts and advised to go to the ED. patient is going to [facility] regional. Referrals REFERRED TO PCP OFFICE Warm transfer to backline

## 2020-05-08 NOTE — Telephone Encounter (Signed)
Noted, chart reviewed.  Currently being treated with fluid resuscitation for hypotension.

## 2020-05-08 NOTE — ED Triage Notes (Signed)
C/O nausea and diarrhea.  Patient states he had his flu shot last Wednesday, and symptoms started Saturday.  States fell in the bathroom on Saturday night and hit head.

## 2020-05-08 NOTE — Telephone Encounter (Signed)
Per chart review tab pt is at [facility] ED. 

## 2020-05-08 NOTE — ED Notes (Signed)
Patient transported to X-ray 

## 2020-05-08 NOTE — ED Provider Notes (Signed)
[facility] Emergency Department Provider Note  Time seen: 11:45 AM  I have reviewed the triage vital signs and the nursing notes.   HISTORY  Chief Complaint Weakness   HPI Timothy Sheppard is a 74 y.o. male with a past medical history of CHF, hypertension, hyperlipidemia, presents to the emergency department for generalized weakness and falls.  According to the patient approximately 6 weeks ago he was diagnosed with Covid, states he remained ill for approximately 1 to 2 weeks with Covid and then recovered.  Patient states he recently got his flu vaccine this past Thursday by Friday began feeling unwell with generalized weakness nausea vomiting and diarrhea.  Patient denies any abdominal pain does state subjective low-grade fever at home.  No cough or shortness of breath, no chest pain.  Patient states Saturday as well as last night he had a fall due to feeling weak.  Is not sure if he hit his head.  Possible loss of consciousness.  Patient brought back to room 7 after being found to be quite hypotensive upon arrival.  Past Medical History:  Diagnosis Date  . CHF (congestive heart failure) ([place])   . Chronic hip pain    Right  . Erectile dysfunction   . Hyperlipidemia   . Hypertension   . Iritis   . Neuropathy     Patient Active Problem List   Diagnosis Date Noted  . COVID-19 virus infection 03/31/2020  . Poison ivy dermatitis 12/30/2019  . Fatigue 09/04/2018  . Prediabetes 06/11/2018  . Arthritis 06/11/2018  . Preventative health care 06/11/2018  . Contact dermatitis due to plant 01/23/2018  . Neuropathy 01/09/2018  . Erectile dysfunction 01/09/2018  . Chronic pain of right lower extremity 11/27/2017  . CHF (congestive heart failure) ([place])   . Allergic rhinitis 08/25/2015  . Elevated serum creatinine 07/21/2013  . BPH (benign prostatic hyperplasia) 07/19/2013  . Palpitations 05/03/2013  . Chest pain 04/25/2013  . HTN (hypertension) 04/25/2013     Past Surgical History:  Procedure Laterality Date  . BACK SURGERY  2000  . HERNIA REPAIR    . ORIF FEMUR FRACTURE Right 1967   rod insertion  . TONSILLECTOMY AND ADENOIDECTOMY      Prior to Admission medications   Medication Sig Start Date End Date Taking? Authorizing Provider  aspirin 81 MG tablet Take 81 mg by mouth daily.    [provider]  furosemide (LASIX) 20 MG tablet TAKE 2 TABLETS (40 MG TOTAL) BY MOUTH DAILY. FOR LEG SWELLING. 05/01/20   [name], NP  gabapentin (NEURONTIN) 300 MG capsule Take 1 capsule by mouth in the morning, 2 capsules in the afternoon, 2 capsules in the evening. 02/22/20   [name], NP  guaiFENesin-codeine (ROBITUSSIN AC) 100-10 MG/5ML syrup Take 5 mLs by mouth 3 (three) times daily as needed for cough. 04/03/20   [name], NP  meloxicam (MOBIC) 15 MG tablet Take 1 tablet (15 mg total) by mouth daily. As needed for pain. 10/01/19   [name], NP  ondansetron (ZOFRAN ODT) 4 MG disintegrating tablet Take 1 tablet (4 mg total) by mouth every 8 (eight) hours as needed for nausea or vomiting. 04/06/20   [name], NP  sildenafil (VIAGRA) 100 MG tablet TAKE ONE TABLET BY MOUTH 30 MINUTES PRIOR TO INTERCOURSE 11/03/19   [name], NP  tamsulosin (FLOMAX) 0.4 MG CAPS capsule TAKE 1 CAPSULE BY MOUTH IN THE MORNING FOR  URINE  STREAM 04/05/20  [name], NP    No Known Allergies  Family History  Problem Relation Age of Onset  . Heart failure Mother   . Lupus Mother   . Heart failure Father     Social History Social History   Tobacco Use  . Smoking status: Former Smoker    Types: Pipe  . Smokeless tobacco: Never Used  . Tobacco comment: Quit 1 mo ago  Vaping Use  . Vaping Use: Never used  Substance Use Topics  . Alcohol use: No  . Drug use: No    Review of Systems Constitutional: Subjective low-grade fever.  Positive for weakness.  Possible LOC. Cardiovascular: Negative for  chest pain. Respiratory: Negative for shortness of breath. Gastrointestinal: Negative for abdominal pain.  Positive for nausea vomiting diarrhea. Genitourinary: Negative for urinary compaints Musculoskeletal: Negative for musculoskeletal complaints Neurological: Negative for headache All other ROS negative  ____________________________________________   PHYSICAL EXAM:  VITAL SIGNS: ED Triage Vitals  Enc Vitals Group     BP 05/08/20 1041 (!) 67/44     Pulse Rate 05/08/20 1041 75     Resp 05/08/20 1041 18     Temp 05/08/20 1041 98.7 F (37.1 C)     Temp src --      SpO2 05/08/20 1041 99 %     Weight 05/08/20 1037 272 lb 4.3 oz (123.5 kg)     Height 05/08/20 1037 5\' 11"  (1.803 m)     Head Circumference --      Peak Flow --      Pain Score 05/08/20 1037 0     Pain Loc --      Pain Edu? --      Excl. in [place]? --     Constitutional: Alert and oriented. Well appearing and in no distress. Eyes: Normal exam ENT      Head: Normocephalic and atraumatic.      Mouth/Throat: Dry appearing mucous membranes. Cardiovascular: Normal rate, regular rhythm.  Respiratory: Normal respiratory effort without tachypnea nor retractions. Breath sounds are clear  Gastrointestinal: Soft and nontender. No distention.  Musculoskeletal: Nontender with normal range of motion in all extremities. Neurologic:  Normal speech and language. No gross focal neurologic deficits  Skin:  Skin is warm, dry and intact.  Psychiatric: Mood and affect are normal.   ____________________________________________    EKG  EKG viewed and interpreted by myself shows a sinus rhythm at 95 bpm with a slightly widened QRS, normal axis, largely normal intervals with nonspecific ST changes.  ____________________________________________    RADIOLOGY  CT scan shows no acute intracranial abnormality no C-spine fracture. Abdominal x-ray shows likely diarrheal illness without small bowel  obstruction.  ____________________________________________   INITIAL IMPRESSION / ASSESSMENT AND PLAN / ED COURSE  Pertinent labs & imaging results that were available during my care of the patient were reviewed by me and considered in my medical decision making (see chart for details).   Patient presents to the emergency department for generalized weakness 2 syncopal/fall episodes nausea vomiting and diarrhea.  Patient found to be quite hypotensive in triage 67/44.  Patient brought back to the room upon recheck had largely normalized.  Patient does appear clinically dehydrated with dry or appearing mucous membranes and a pulse rate around 100 bpm during my evaluation.  We will begin IV hydration.  We will check labs and continue to closely monitor.  Differential would include dehydration, hypovolemia, metabolic or electrolyte abnormality.  Reassuringly patient has a benign abdominal exam.  Patient's  lab work is resulted largely within normal limits besides a slight leukocytosis.  Reassuringly kidney function is normal.  Patient does appear dehydrated with a anion gap of 14 we will continue IV hydration.  Troponin negative.  Urine is pending.  Patient is feeling much better.  Plan to likely discharge home pending urine results.  Patient care signed out to oncoming provider.  Timothy Sheppard was evaluated in Emergency Department on 05/08/2020 for the symptoms described in the history of present illness. He was evaluated in the context of the global COVID-19 pandemic, which necessitated consideration that the patient might be at risk for infection with the SARS-CoV-2 virus that causes COVID-19. Institutional protocols and algorithms that pertain to the evaluation of patients at risk for COVID-19 are in a state of rapid change based on information released by regulatory bodies including the CDC and federal and state organizations. These policies and algorithms were followed during the patient's care in  the ED.  ____________________________________________   FINAL CLINICAL IMPRESSION(S) / ED DIAGNOSES  Weakness Nausea vomiting diarrhea   Harvest Dark, MD 05/08/20 [phone]

## 2020-05-08 NOTE — Telephone Encounter (Signed)
Baker Janus nurse with access nurse said that pt having vomiting and diarrhea; on Sat fell and lacertion to forehead and pt fell again on 05/07/20 and has another laceration and goose egg on head. Baker Janus advised pt needs to go to ED for eval, testing if needed and treatment. Baker Janus voiced understanding and will let lady on phone know disposition. FYI to Gentry Fitz NP.

## 2020-05-08 NOTE — ED Notes (Signed)
Provided with drink and crackers for PO challenge, denies nausea.

## 2020-05-23 ENCOUNTER — Ambulatory Visit: Payer: Medicare Other | Admitting: Dermatology

## 2020-05-25 ENCOUNTER — Other Ambulatory Visit: Payer: Self-pay

## 2020-05-25 ENCOUNTER — Ambulatory Visit: Payer: Medicare Other | Admitting: Dermatology

## 2020-05-25 DIAGNOSIS — D692 Other nonthrombocytopenic purpura: Secondary | ICD-10-CM

## 2020-05-25 DIAGNOSIS — L820 Inflamed seborrheic keratosis: Secondary | ICD-10-CM | POA: Diagnosis not present

## 2020-05-25 DIAGNOSIS — L578 Other skin changes due to chronic exposure to nonionizing radiation: Secondary | ICD-10-CM

## 2020-05-25 DIAGNOSIS — L570 Actinic keratosis: Secondary | ICD-10-CM | POA: Diagnosis not present

## 2020-05-25 DIAGNOSIS — Z85828 Personal history of other malignant neoplasm of skin: Secondary | ICD-10-CM

## 2020-05-25 NOTE — Progress Notes (Signed)
   Follow-Up Visit   Subjective  Timothy Sheppard is a 74 y.o. male who presents for the following: Other (Spots of nose that just came up and are scaly).  The following portions of the chart were reviewed this encounter and updated as appropriate:  Tobacco  Allergies  Meds  Problems  Med Hx  Surg Hx  Fam Hx     Review of Systems:  No other skin or systemic complaints except as noted in HPI or Assessment and Plan.  Objective  Well appearing patient in no apparent distress; mood and affect are within normal limits.  A focused examination was performed including face; head; arms. eyes. Relevant physical exam findings are noted in the Assessment and Plan.  Objective  Nasal bridge x 1, forehead x 10 (11): Erythematous thin papules/macules with gritty scale.   Objective  Right infraorbital: Erythematous keratotic or waxy stuck-on papule or plaque.    Assessment & Plan    Actinic Damage - chronic, secondary to cumulative UV radiation exposure/sun exposure over time - diffuse scaly erythematous macules with underlying dyspigmentation - Recommend daily broad spectrum sunscreen SPF 30+ to sun-exposed areas, reapply every 2 hours as needed.  - Call for new or changing lesions.  History of Basal Cell Carcinoma of the Skin - No evidence of recurrence today - Recommend regular full body skin exams - Recommend daily broad spectrum sunscreen SPF 30+ to sun-exposed areas, reapply every 2 hours as needed.  - Call if any new or changing lesions are noted between office visits  Purpura - Violaceous macules and patches - Benign - Related to age, sun damage and/or use of blood thinners - Observe - Can use OTC arnica containing moisturizer such as Dermend Bruise Formula if desired - Call for worsening or other concerns   AK (actinic keratosis) (11) Nasal bridge x 1, forehead x 10  Destruction of lesion - Nasal bridge x 1, forehead x 10 Complexity: simple   Destruction method:  cryotherapy   Informed consent: discussed and consent obtained   Timeout:  patient name, date of birth, surgical site, and procedure verified Lesion destroyed using liquid nitrogen: Yes   Region frozen until ice ball extended beyond lesion: Yes   Outcome: patient tolerated procedure well with no complications   Post-procedure details: wound care instructions given    Inflamed seborrheic keratosis Right infraorbital  Destruction of lesion - Right infraorbital Complexity: simple   Destruction method: cryotherapy   Informed consent: discussed and consent obtained   Timeout:  patient name, date of birth, surgical site, and procedure verified Lesion destroyed using liquid nitrogen: Yes   Region frozen until ice ball extended beyond lesion: Yes   Outcome: patient tolerated procedure well with no complications   Post-procedure details: wound care instructions given    Return in about 6 months (around 11/22/2020) for AK follow up, UBSE.   I, Ashok Cordia, CMA, am acting as scribe for Sarina Ser, MD .  Documentation: I have reviewed the above documentation for accuracy and completeness, and I agree with the above.  Sarina Ser, MD

## 2020-05-25 NOTE — Patient Instructions (Signed)

## 2020-05-29 ENCOUNTER — Encounter: Payer: Self-pay | Admitting: Dermatology

## 2020-07-21 ENCOUNTER — Other Ambulatory Visit: Payer: Self-pay | Admitting: Primary Care

## 2020-07-21 DIAGNOSIS — N401 Enlarged prostate with lower urinary tract symptoms: Secondary | ICD-10-CM

## 2020-07-24 ENCOUNTER — Telehealth: Payer: Self-pay | Admitting: Primary Care

## 2020-07-24 NOTE — Telephone Encounter (Signed)
Patient is needing medication refills on the following:  Medication Flomax ( [facility]- [address] [place], [place]) Furosemide- [facility] ( [place], [place])

## 2020-07-25 ENCOUNTER — Other Ambulatory Visit: Payer: Self-pay

## 2020-07-25 ENCOUNTER — Other Ambulatory Visit: Payer: Self-pay | Admitting: Cardiovascular Disease

## 2020-07-25 ENCOUNTER — Other Ambulatory Visit: Payer: Self-pay | Admitting: Primary Care

## 2020-07-25 DIAGNOSIS — R609 Edema, unspecified: Secondary | ICD-10-CM

## 2020-07-25 DIAGNOSIS — G609 Hereditary and idiopathic neuropathy, unspecified: Secondary | ICD-10-CM

## 2020-07-25 MED ORDER — FUROSEMIDE 20 MG PO TABS: 40.0000 mg | ORAL_TABLET | Freq: Every day | ORAL | 0 refills | Status: DC

## 2020-07-26 NOTE — Telephone Encounter (Signed)
Refill sent in

## 2020-08-25 ENCOUNTER — Other Ambulatory Visit: Payer: Self-pay | Admitting: Primary Care

## 2020-08-25 DIAGNOSIS — N529 Male erectile dysfunction, unspecified: Secondary | ICD-10-CM

## 2020-08-25 NOTE — Telephone Encounter (Signed)
Refill sildenafil Last office visit virtual 03/31/20 Last refill 11/03/19 #30

## 2020-08-25 NOTE — Telephone Encounter (Signed)
Please call patient, both he and his wife are overdue for general follow-up for all of their medications. Please have them both schedule for CPE is with me, and Medicare wellness visit with our wellness nurse.  Once this is has been scheduled I can send a refill.

## 2020-08-28 ENCOUNTER — Telehealth: Payer: Self-pay | Admitting: *Deleted

## 2020-08-28 NOTE — Telephone Encounter (Signed)
Noted, will evaluate as scheduled.  

## 2020-08-28 NOTE — Telephone Encounter (Signed)
Please see note below access nurse note where [facility] LPN spoke with pt; pt already has appt with Gentry Fitz NP on 08/29/20 at 8:20. Sending note to Gentry Fitz NP.

## 2020-08-28 NOTE — Telephone Encounter (Signed)
[place] Night - Client TELEPHONE ADVICE RECORD Access[facility]urse Patient Name: Timothy Sheppard Gender: Male DOB: [birth year] Age: 75 Y 42 M 18 D Return Phone Number: [phone] (Primary) Address: City/State/Zip: [place] [address] Client [place] Night - Client Client Site [place] Physician Alma Friendly - NP Contact Type Call Who Is Calling Patient / Member / Family / Caregiver Call Type Triage / Clinical Caller Name Mrs. Belenda Cruise Relationship To Patient Spouse Return Phone Number [phone] (Primary) Chief Complaint Neck Pain Reason for Call Symptomatic / Request for Health Information Initial Comment Caller states, husband has neck pain ( left side back pain). The pains shoots up his neck. Wanting an appt. Pt has no chest pain or sob. Translation No No Triage Reason Other Nurse Assessment Nurse: Waymon Budge, RN, Vaughan Basta Date/Time (Eastern Time): 08/27/2020 10:47:24 AM Confirm and document reason for call. If symptomatic, describe symptoms. ---Caller states, husband has neck pain (left side back pain). The pains shoots up his neck. Caller is not with patient. Would like appointment for tomorrow. Does the patient have any new or worsening symptoms? ---Yes Will a triage be completed? ---No Select reason for no triage. ---Other Please document clinical information provided and list any resource used. ---Recommended that patient call for triage. Call office in the morning for appointment. Disp. Time [name] Time) Disposition Final User 08/27/2020 10:50:20 AM Clinical Call Yes Waymon Budge, RN, Vaughan Basta

## 2020-08-28 NOTE — Telephone Encounter (Signed)
Patient talked with access nurse and was advised to go to the ER but patient declined,.  Called and spoke to patient and was advised that he does not feel that his situation is life threatening. Patient stated that he does not feel that he needs to go to the ER. Patient stated that his symptoms started over a week ago. Patient stated that he has had pain in the back of his neck  that goes to the bone. Patient stated that his shoulder is sore. Patient stated that he has been handling a lot of fire wood in the last couple of weeks. Patient stated that he both of his hands go numb at times but that has been going on for a long time. Patient denies, chest pain, SOB or any other symptoms. Patient stated that he would rather just come in and see Allie Bossier NP. Patient stated that he has a lot of arthritis. Patient scheduled to see Allie Bossier NP tomorrow 08/29/20. Patient given ER precautions and he verbalized understanding.

## 2020-08-28 NOTE — Telephone Encounter (Signed)
Called to scheduled OV/CPE. Unable to LM. Pt already scheduled for OV 2/8.

## 2020-08-29 ENCOUNTER — Ambulatory Visit (INDEPENDENT_AMBULATORY_CARE_PROVIDER_SITE_OTHER): Payer: Medicare Other | Admitting: Primary Care

## 2020-08-29 ENCOUNTER — Ambulatory Visit (INDEPENDENT_AMBULATORY_CARE_PROVIDER_SITE_OTHER)
Admission: RE | Admit: 2020-08-29 | Discharge: 2020-08-29 | Disposition: A | Discharge status: Home / Self Care | Payer: Medicare Other | Source: Ambulatory Visit | Attending: Primary Care | Admitting: Primary Care

## 2020-08-29 ENCOUNTER — Other Ambulatory Visit: Payer: Self-pay | Admitting: Primary Care

## 2020-08-29 ENCOUNTER — Encounter: Payer: Self-pay | Admitting: Primary Care

## 2020-08-29 ENCOUNTER — Other Ambulatory Visit: Payer: Self-pay

## 2020-08-29 VITALS — BP 116/60 | HR 83 | Temp 97.9°F | Wt 258.0 lb

## 2020-08-29 DIAGNOSIS — R7303 Prediabetes: Secondary | ICD-10-CM

## 2020-08-29 DIAGNOSIS — I10 Essential (primary) hypertension: Secondary | ICD-10-CM

## 2020-08-29 DIAGNOSIS — R3912 Poor urinary stream: Secondary | ICD-10-CM | POA: Diagnosis not present

## 2020-08-29 DIAGNOSIS — G629 Polyneuropathy, unspecified: Secondary | ICD-10-CM

## 2020-08-29 DIAGNOSIS — M1990 Unspecified osteoarthritis, unspecified site: Secondary | ICD-10-CM

## 2020-08-29 DIAGNOSIS — N529 Male erectile dysfunction, unspecified: Secondary | ICD-10-CM

## 2020-08-29 DIAGNOSIS — M25512 Pain in left shoulder: Secondary | ICD-10-CM | POA: Diagnosis not present

## 2020-08-29 DIAGNOSIS — G609 Hereditary and idiopathic neuropathy, unspecified: Secondary | ICD-10-CM

## 2020-08-29 DIAGNOSIS — I509 Heart failure, unspecified: Secondary | ICD-10-CM | POA: Diagnosis not present

## 2020-08-29 DIAGNOSIS — N401 Enlarged prostate with lower urinary tract symptoms: Secondary | ICD-10-CM | POA: Diagnosis not present

## 2020-08-29 DIAGNOSIS — S42002A Fracture of unspecified part of left clavicle, initial encounter for closed fracture: Secondary | ICD-10-CM | POA: Diagnosis not present

## 2020-08-29 HISTORY — DX: Pain in left shoulder: M25.512

## 2020-08-29 LAB — COMPREHENSIVE METABOLIC PANEL
ALT: 9 U/L (ref 0–53)
AST: 15 U/L (ref 0–37)
Albumin: 4.2 g/dL (ref 3.5–5.2)
Alkaline Phosphatase: 54 U/L (ref 39–117)
BUN: 16 mg/dL (ref 6–23)
CO2: 33 mEq/L — ABNORMAL HIGH (ref 19–32)
Calcium: 10.1 mg/dL (ref 8.4–10.5)
Chloride: 101 mEq/L (ref 96–112)
Creatinine, Ser: 1.17 mg/dL (ref 0.40–1.50)
GFR: 62.26 mL/min (ref >60.00)
Glucose, Bld: 91 mg/dL (ref 70–99)
Potassium: 4 mEq/L (ref 3.5–5.1)
Sodium: 138 mEq/L (ref 135–145)
Total Bilirubin: 0.6 mg/dL (ref 0.2–1.2)
Total Protein: 7 g/dL (ref 6.0–8.3)

## 2020-08-29 LAB — CBC
HCT: 44.7 % (ref 39.0–52.0)
Hemoglobin: 15.1 g/dL (ref 13.0–17.0)
MCHC: 33.8 g/dL (ref 30.0–36.0)
MCV: 93.5 fl (ref 78.0–100.0)
Platelets: 232 10*3/uL (ref 150.0–400.0)
RBC: 4.78 Mil/uL (ref 4.22–5.81)
RDW: 13.5 % (ref 11.5–15.5)
WBC: 7.5 10*3/uL (ref 4.0–10.5)

## 2020-08-29 LAB — LIPID PANEL
Cholesterol: 163 mg/dL (ref 0–200)
HDL: 43.9 mg/dL (ref >39.00)
LDL Cholesterol: 102 mg/dL — ABNORMAL HIGH (ref 0–99)
NonHDL: 119.56
Total CHOL/HDL Ratio: 4
Triglycerides: 87 mg/dL (ref 0.0–149.0)
VLDL: 17.4 mg/dL (ref 0.0–40.0)

## 2020-08-29 LAB — PSA, MEDICARE: PSA: 0.34 ng/ml (ref 0.10–4.00)

## 2020-08-29 LAB — HEMOGLOBIN A1C: Hgb A1c MFr Bld: 5.6 % (ref 4.6–6.5)

## 2020-08-29 MED ORDER — GABAPENTIN 600 MG PO TABS: 600.0000 mg | ORAL_TABLET | Freq: Three times a day (TID) | ORAL | 1 refills | Status: DC

## 2020-08-29 MED ORDER — DICLOFENAC SODIUM 1 % EX GEL: 2.0000 g | Freq: Three times a day (TID) | CUTANEOUS | 0 refills | Status: DC | PRN

## 2020-08-29 MED ORDER — SILDENAFIL CITRATE 100 MG PO TABS: ORAL_TABLET | ORAL | 0 refills | Status: DC

## 2020-08-29 NOTE — Assessment & Plan Note (Signed)
Continues to do well on tamsulosin 0.4 mg daily. Continue same.

## 2020-08-29 NOTE — Assessment & Plan Note (Signed)
Chronic, ongoing, commended weight loss. Hopefully the increased dose of gabapentin 600 mg TID will help.

## 2020-08-29 NOTE — Patient Instructions (Signed)
Stop by the lab and xray prior to leaving today. I will notify you of your results once received.   We changed your gabapentin to 600 mg capsules. Take 1 capsule three times daily for pain.  Set up a visit with Dr. Lorelei Pont for your shoulder.   It was a pleasure to see you today!

## 2020-08-29 NOTE — Assessment & Plan Note (Signed)
Repeat A1C pending, commended him on weight loss.

## 2020-08-29 NOTE — Progress Notes (Signed)
Subjective:    Patient ID: LARUE LIGHTNER, male    DOB: 06-11-1946, 75 y.o.   MRN: [id number]  HPI  This visit occurred during the SARS-CoV-2 public health emergency.  Safety protocols were in place, including screening questions prior to the visit, additional usage of staff PPE, and extensive cleaning of exam room while observing appropriate contact time as indicated for disinfecting solutions.   Mr. Treu is a 75 year old male with a history of hypertension, CHF, neuropathy, BPH, chronic lower extremity pain, Covid-19 who presents today with a chief complaint of back pain and also for general follow up.  1) Essential Hypertension/CHF: Not currently managed on medications. Denies headaches, dizziness, chest pain. He has an appointment scheduled with his cardiologist in early April 2022.   Also managed on furosemide 40 mg daily which helps tremendously with lower extremity edema.   BP Readings from Last 3 Encounters:  08/29/20 116/60  05/08/20 129/84  04/01/20 (!) 151/78   2) Neuropathy: Chronic, mostly located to the bilateral plantar feet that is bothersome mostly at night or when rest. He describes the pain as "sharp ice bucket" pain. He is currently managed on gabapentin 300 mg for which he is taking 600 mg in the AM and 600 mg in the afternoon, 600 mg mg with improvement. He is prescribed 300 mg in AM and 900 mg in PM.   He is working on weight loss due to chronic back pain. History of prior back surgery. He is wanting a letter to allow his insurance to pay for his monthly pedicure due to his neuropathy.   Wt Readings from Last 3 Encounters:  08/29/20 258 lb (117 kg)  05/08/20 272 lb 4.3 oz (123.5 kg)  12/30/19 272 lb 4 oz (123.5 kg)    3) Erectile Dysfunction/BPH: Currently managed in sildenafil 100 mg for which he uses sparingly. He's noticed improvement most of the time with his sildenafil. He is needing refills. Also managed on tamsulosin 0.4 mg daily and does very well.    4) Shoulder Pain: Acute. Originally located to the left lateral neck with radiation down to his anterior left shoulder. He's been handling a lot of firewood recently, lots of repetitive movement over the last three weeks. He denies trauma. Now his pain is most bothersome to the left anterior shoulder, occurs with movement and rest. Mild decrease in ROM. He denies numbness/tinlging. He's applied Blu Emu with some improvement.   Review of Systems  Eyes: Negative for visual disturbance.  Respiratory: Negative for shortness of breath.   Cardiovascular: Negative for chest pain.  Genitourinary:       Erectile dysfunction  Musculoskeletal: Positive for arthralgias and back pain.       Acute left shoulder pain  Neurological: Negative for dizziness.       Chronic neuropathy to plantar feet       Past Medical History:  Diagnosis Date  . Basal cell carcinoma 05/13/2019   Drosum nose supratip - ED&C   . CHF (congestive heart failure) ([place])   . Chronic hip pain    Right  . Erectile dysfunction   . Hyperlipidemia   . Hypertension   . Iritis   . Neuropathy      Social History   Socioeconomic History  . Marital status: Married    Spouse name: Not on file  . Number of children: Not on file  . Years of education: Not on file  . Highest education level: Not on file  Occupational History  . Not on file  Tobacco Use  . Smoking status: Former Smoker    Types: Pipe  . Smokeless tobacco: Never Used  . Tobacco comment: Quit 1 mo ago  Vaping Use  . Vaping Use: Never used  Substance and Sexual Activity  . Alcohol use: No  . Drug use: No  . Sexual activity: Not on file  Other Topics Concern  . Not on file  Social History Narrative   Married.   2 children, 2 grandchildren, 2 great grandchilden.   Retired.    Social Determinants of Health   Financial Resource Strain: Low Risk   . Difficulty of Paying Living Expenses: Not hard at all  Food Insecurity: No Food Insecurity  . Worried  About Charity fundraiser in the Last Year: Never true  . Ran Out of Food in the Last Year: Never true  Transportation Needs: No Transportation Needs  . Lack of Transportation (Medical): No  . Lack of Transportation (Non-Medical): No  Physical Activity: Inactive  . Days of Exercise per Week: 0 days  . Minutes of Exercise per Session: 0 min  Stress: No Stress Concern Present  . Feeling of Stress : Not at all  Social Connections: Not on file  Intimate Partner Violence: Not At Risk  . Fear of Current or Ex-Partner: No  . Emotionally Abused: No  . Physically Abused: No  . Sexually Abused: No    Past Surgical History:  Procedure Laterality Date  . BACK SURGERY  2000  . HERNIA REPAIR    . ORIF FEMUR FRACTURE Right 1967   rod insertion  . TONSILLECTOMY AND ADENOIDECTOMY      Family History  Problem Relation Age of Onset  . Heart failure Mother   . Lupus Mother   . Heart failure Father     No Known Allergies  Current Outpatient Medications on File Prior to Visit  Medication Sig Dispense Refill  . aspirin 81 MG tablet Take 81 mg by mouth daily.    . furosemide (LASIX) 20 MG tablet Take 2 tablets (40 mg total) by mouth daily. For leg swelling. 180 tablet 0  . tamsulosin (FLOMAX) 0.4 MG CAPS capsule TAKE 1 CAPSULE BY MOUTH IN THE MORNING FOR  URINE  STREAM 90 capsule 0   No current facility-administered medications on file prior to visit.    BP 116/60 (BP Location: Left Arm, Patient Position: Sitting, Cuff Size: Large)   Pulse 83   Temp 97.9 F (36.6 C)   Wt 258 lb (117 kg)   SpO2 96%   BMI 35.98 kg/m    Objective:   Physical Exam Constitutional:      Appearance: He is well-nourished.  Cardiovascular:     Rate and Rhythm: Normal rate and regular rhythm.  Pulmonary:     Effort: Pulmonary effort is normal.     Breath sounds: Normal breath sounds.  Musculoskeletal:     Left shoulder: No tenderness or bony tenderness. Decreased range of motion. Normal strength.      Cervical back: Neck supple.     Comments: Slight decrease in ROM with forward abduction. Pain with ROM. 5/5 strength bilaterally to upper extremities.   Skin:    General: Skin is warm and dry.  Psychiatric:        Mood and Affect: Mood and affect normal.            Assessment & Plan:

## 2020-08-29 NOTE — Assessment & Plan Note (Signed)
Will be seeing Cardiology in April 2022. Doing well on daily furosemide 40 mg. Continue same.

## 2020-08-29 NOTE — Assessment & Plan Note (Signed)
Appears to be bursitis secondary to repetitive movement from moving wood.  Stable exam. Rx for diclofenac gel provided. He will set up an appointment with sports medicine.

## 2020-08-29 NOTE — Telephone Encounter (Signed)
Called patient to schedule cpe. Line busy.

## 2020-08-29 NOTE — Assessment & Plan Note (Signed)
Chronic, continued, was told years ago this was secondary to arthritis.   He's finding more relief with gabapentin 600 mg TID, will change Rx to reflect this dosing. Changed Rx to 600 mg TID.

## 2020-08-29 NOTE — Assessment & Plan Note (Signed)
Well controlled on furosemide 40 mg daily, continue same. CMP pending.

## 2020-08-29 NOTE — Assessment & Plan Note (Signed)
Doing well on PRN sildenafil 100 mg. Continue same. Refills provided.

## 2020-08-30 ENCOUNTER — Telehealth: Payer: Self-pay | Admitting: Primary Care

## 2020-08-30 NOTE — Telephone Encounter (Signed)
Returning call for xray results

## 2020-08-31 NOTE — Telephone Encounter (Signed)
See lab notes for documentation  

## 2020-09-04 ENCOUNTER — Ambulatory Visit: Payer: Medicare Other | Admitting: Family Medicine

## 2020-09-04 ENCOUNTER — Telehealth: Payer: Self-pay

## 2020-09-04 NOTE — Telephone Encounter (Signed)
Appears from appt notes that pt already cancelled appt.

## 2020-09-04 NOTE — Telephone Encounter (Signed)
[place] Night - Client Nonclinical Telephone Record AccessNurse Client [facility] Night - Client Client Site [place] Physician Owens Loffler - MD Contact Type Call Who Is Calling Patient / Member / Family / Caregiver Caller Name Timothy Sheppard Caller Phone Number [phone] Patient Name Timothy Sheppard Patient DOB [birth year] Call Type Message Only Information Provided Reason for Call Request to [place] Appointment Initial Comment Caller wants to cancel her appt for 09-04-2020 at 9am. Additional Comment Message was taken office hours were provided. Disp. Time Disposition Final User 09/04/2020 7:34:53 AM General Information Provided Yes Oletta Lamas, Tamika Call Closed By: Noni Saupe Transaction Date/Time: 09/04/2020 7:32:29 AM (ET)

## 2020-10-17 ENCOUNTER — Other Ambulatory Visit: Payer: Self-pay | Admitting: Primary Care

## 2020-10-17 DIAGNOSIS — R609 Edema, unspecified: Secondary | ICD-10-CM

## 2020-10-19 ENCOUNTER — Other Ambulatory Visit: Payer: Self-pay | Admitting: Primary Care

## 2020-10-19 DIAGNOSIS — G609 Hereditary and idiopathic neuropathy, unspecified: Secondary | ICD-10-CM

## 2020-10-20 ENCOUNTER — Other Ambulatory Visit: Payer: Self-pay | Admitting: Primary Care

## 2020-10-20 DIAGNOSIS — G609 Hereditary and idiopathic neuropathy, unspecified: Secondary | ICD-10-CM

## 2020-10-24 ENCOUNTER — Ambulatory Visit: Payer: Medicare Other | Admitting: Cardiovascular Disease

## 2020-10-27 ENCOUNTER — Telehealth: Payer: Self-pay | Admitting: Primary Care

## 2020-10-27 DIAGNOSIS — R3912 Poor urinary stream: Secondary | ICD-10-CM

## 2020-10-27 DIAGNOSIS — N401 Enlarged prostate with lower urinary tract symptoms: Secondary | ICD-10-CM

## 2020-10-27 DIAGNOSIS — G629 Polyneuropathy, unspecified: Secondary | ICD-10-CM

## 2020-10-27 NOTE — Telephone Encounter (Signed)
  LAST APPOINTMENT DATE: 10/20/2020   NEXT APPOINTMENT DATE:@Visit  date not found  MEDICATION: gabapentin 300mg  ( wife stated the 600 cost too much  PHARMACY: [facility]  Let patient know to contact pharmacy at the end of the day to make sure medication is ready.  Please notify patient to allow 48-72 hours to process  Encourage patient to contact the pharmacy for refills or they can request refills through [place]:   LAST REFILL:  QTY:  REFILL DATE:    OTHER COMMENTS:    Okay for refill?  Please advise

## 2020-10-28 ENCOUNTER — Other Ambulatory Visit: Payer: Self-pay | Admitting: Primary Care

## 2020-10-28 DIAGNOSIS — G609 Hereditary and idiopathic neuropathy, unspecified: Secondary | ICD-10-CM

## 2020-10-30 ENCOUNTER — Other Ambulatory Visit: Payer: Self-pay | Admitting: Primary Care

## 2020-10-30 DIAGNOSIS — G609 Hereditary and idiopathic neuropathy, unspecified: Secondary | ICD-10-CM

## 2020-10-30 NOTE — Telephone Encounter (Signed)
He is out of his medication.

## 2020-10-31 NOTE — Telephone Encounter (Signed)
I see that he's back to taking the 300 mg dose, he should have notified us sooner so that he wouldn't be out of medication.  How much of the 300 mg dose is he taking and how often? For example, 300 mg three times daily?

## 2020-11-01 ENCOUNTER — Other Ambulatory Visit: Payer: Self-pay | Admitting: Primary Care

## 2020-11-01 DIAGNOSIS — G609 Hereditary and idiopathic neuropathy, unspecified: Secondary | ICD-10-CM

## 2020-11-01 MED ORDER — GABAPENTIN 300 MG PO CAPS: ORAL_CAPSULE | ORAL | 1 refills | Status: DC

## 2020-11-01 NOTE — Telephone Encounter (Signed)
Patient called back stating that he needs this taken care of today because he is almost out of his medication. Patient stated that he needs a 90 day supply sent to the pharmacy which would be #360. Advised patient that Allie Bossier NP is with patients and she usually looks at her messages during her lunch time. Patient stated that he wants a call back when this is taken care of.

## 2020-11-01 NOTE — Telephone Encounter (Signed)
Response from Patient:  He is taking 4 a day (300mg  each) 1 in the morning and 3 at night. The 600 mg is too expensive. Patient is  upset and not understanding why we can't fill his prescription and why its taking so long. Please advise. EM

## 2020-11-01 NOTE — Telephone Encounter (Signed)
Please remind patient that he if had notified me sooner that he did not pick up the 600 mg dose I could have addressed this much sooner. Prior to his recent calls, we were under the impression that he was taking the 600 mg dose. There's no way for [place] to know what he's taking unless he updates [place].  Will change Rx to 300 mg dose and send to pharmacy now.

## 2020-11-01 NOTE — Telephone Encounter (Signed)
Called patient reviewed all information and repeated back to me. Will call if any questions.  He will pick up script today

## 2020-12-04 ENCOUNTER — Ambulatory Visit: Payer: Medicare Other | Admitting: Dermatology

## 2021-01-24 ENCOUNTER — Other Ambulatory Visit: Payer: Self-pay | Admitting: Primary Care

## 2021-01-24 DIAGNOSIS — N529 Male erectile dysfunction, unspecified: Secondary | ICD-10-CM

## 2021-03-20 ENCOUNTER — Ambulatory Visit: Payer: Medicare Other | Admitting: Primary Care

## 2021-04-22 ENCOUNTER — Other Ambulatory Visit: Payer: Self-pay | Admitting: Primary Care

## 2021-04-22 DIAGNOSIS — G629 Polyneuropathy, unspecified: Secondary | ICD-10-CM

## 2021-04-22 DIAGNOSIS — R609 Edema, unspecified: Secondary | ICD-10-CM

## 2021-07-09 ENCOUNTER — Telehealth: Payer: Self-pay | Admitting: Primary Care

## 2021-07-09 DIAGNOSIS — G629 Polyneuropathy, unspecified: Secondary | ICD-10-CM

## 2021-07-10 NOTE — Telephone Encounter (Signed)
Called mr. Karnes and spoke to mrs. Belenda Cruise and scheduled him for 09/14/20 at 940

## 2021-07-10 NOTE — Telephone Encounter (Signed)
Patient needs CPE scheduled for February 2023.

## 2021-07-20 ENCOUNTER — Other Ambulatory Visit: Payer: Self-pay | Admitting: Primary Care

## 2021-07-20 DIAGNOSIS — N529 Male erectile dysfunction, unspecified: Secondary | ICD-10-CM

## 2021-07-20 DIAGNOSIS — R609 Edema, unspecified: Secondary | ICD-10-CM

## 2021-09-14 ENCOUNTER — Encounter: Payer: Medicare Other | Admitting: Primary Care

## 2021-09-28 ENCOUNTER — Ambulatory Visit (INDEPENDENT_AMBULATORY_CARE_PROVIDER_SITE_OTHER): Payer: Medicare Other | Admitting: Primary Care

## 2021-09-28 ENCOUNTER — Encounter: Payer: Self-pay | Admitting: Primary Care

## 2021-09-28 ENCOUNTER — Other Ambulatory Visit: Payer: Self-pay

## 2021-09-28 VITALS — BP 128/74 | HR 52 | Temp 97.8°F | Resp 16 | Ht 71.0 in | Wt 274.2 lb

## 2021-09-28 DIAGNOSIS — Z1211 Encounter for screening for malignant neoplasm of colon: Secondary | ICD-10-CM

## 2021-09-28 DIAGNOSIS — I509 Heart failure, unspecified: Secondary | ICD-10-CM | POA: Diagnosis not present

## 2021-09-28 DIAGNOSIS — Z0000 Encounter for general adult medical examination without abnormal findings: Secondary | ICD-10-CM

## 2021-09-28 DIAGNOSIS — D509 Iron deficiency anemia, unspecified: Secondary | ICD-10-CM | POA: Diagnosis not present

## 2021-09-28 DIAGNOSIS — N529 Male erectile dysfunction, unspecified: Secondary | ICD-10-CM

## 2021-09-28 DIAGNOSIS — N400 Enlarged prostate without lower urinary tract symptoms: Secondary | ICD-10-CM

## 2021-09-28 DIAGNOSIS — G629 Polyneuropathy, unspecified: Secondary | ICD-10-CM | POA: Diagnosis not present

## 2021-09-28 DIAGNOSIS — R7303 Prediabetes: Secondary | ICD-10-CM | POA: Diagnosis not present

## 2021-09-28 DIAGNOSIS — Z125 Encounter for screening for malignant neoplasm of prostate: Secondary | ICD-10-CM | POA: Diagnosis not present

## 2021-09-28 DIAGNOSIS — I10 Essential (primary) hypertension: Secondary | ICD-10-CM

## 2021-09-28 HISTORY — DX: Iron deficiency anemia, unspecified: D50.9

## 2021-09-28 LAB — CBC
HCT: 43.5 % (ref 39.0–52.0)
Hemoglobin: 15 g/dL (ref 13.0–17.0)
MCHC: 34.4 g/dL (ref 30.0–36.0)
MCV: 91.5 fl (ref 78.0–100.0)
Platelets: 208 10*3/uL (ref 150.0–400.0)
RBC: 4.76 Mil/uL (ref 4.22–5.81)
RDW: 14 % (ref 11.5–15.5)
WBC: 7.6 10*3/uL (ref 4.0–10.5)

## 2021-09-28 LAB — COMPREHENSIVE METABOLIC PANEL
ALT: 11 U/L (ref 0–53)
AST: 21 U/L (ref 0–37)
Albumin: 4.1 g/dL (ref 3.5–5.2)
Alkaline Phosphatase: 48 U/L (ref 39–117)
BUN: 10 mg/dL (ref 6–23)
CO2: 33 mEq/L — ABNORMAL HIGH (ref 19–32)
Calcium: 9.2 mg/dL (ref 8.4–10.5)
Chloride: 101 mEq/L (ref 96–112)
Creatinine, Ser: 1.07 mg/dL (ref 0.40–1.50)
GFR: 68.78 mL/min (ref >60.00)
Glucose, Bld: 115 mg/dL — ABNORMAL HIGH (ref 70–99)
Potassium: 4 mEq/L (ref 3.5–5.1)
Sodium: 139 mEq/L (ref 135–145)
Total Bilirubin: 1.5 mg/dL — ABNORMAL HIGH (ref 0.2–1.2)
Total Protein: 6.7 g/dL (ref 6.0–8.3)

## 2021-09-28 LAB — LIPID PANEL
Cholesterol: 159 mg/dL (ref 0–200)
HDL: 45 mg/dL (ref >39.00)
LDL Cholesterol: 96 mg/dL (ref 0–99)
NonHDL: 114.44
Total CHOL/HDL Ratio: 4
Triglycerides: 91 mg/dL (ref 0.0–149.0)
VLDL: 18.2 mg/dL (ref 0.0–40.0)

## 2021-09-28 LAB — PSA, MEDICARE: PSA: 0.15 ng/ml (ref 0.10–4.00)

## 2021-09-28 LAB — HEMOGLOBIN A1C: Hgb A1c MFr Bld: 5.6 % (ref 4.6–6.5)

## 2021-09-28 LAB — IBC + FERRITIN
Ferritin: 145.2 ng/mL (ref 22.0–322.0)
Iron: 95 ug/dL (ref 42–165)
Saturation Ratios: 40.2 % (ref 20.0–50.0)
TIBC: 236.6 ug/dL — ABNORMAL LOW (ref 250.0–450.0)
Transferrin: 169 mg/dL — ABNORMAL LOW (ref 212.0–360.0)

## 2021-09-28 NOTE — Patient Instructions (Signed)
Stop by the lab prior to leaving today. I will notify you of your results once received.  ? ?Complete the Cologuard kit once received. ? ?It was a pleasure to see you today! ? ?Preventive Care 51 Years and Older, Male ?Preventive care refers to lifestyle choices and visits with your health care provider that can promote health and wellness. Preventive care visits are also called wellness exams. ?What can I expect for my preventive care visit? ?Counseling ?During your preventive care visit, your health care provider may ask about your: ?Medical history, including: ?Past medical problems. ?Family medical history. ?History of falls. ?Current health, including: ?Emotional well-being. ?Home life and relationship well-being. ?Sexual activity. ?Memory and ability to understand (cognition). ?Lifestyle, including: ?Alcohol, nicotine or tobacco, and drug use. ?Access to firearms. ?Diet, exercise, and sleep habits. ?Work and work Statistician. ?Sunscreen use. ?Safety issues such as seatbelt and bike helmet use. ?Physical exam ?Your health care provider will check your: ?Height and weight. These may be used to calculate your BMI (body mass index). BMI is a measurement that tells if you are at a healthy weight. ?Waist circumference. This measures the distance around your waistline. This measurement also tells if you are at a healthy weight and may help predict your risk of certain diseases, such as type 2 diabetes and high blood pressure. ?Heart rate and blood pressure. ?Body temperature. ?Skin for abnormal spots. ?What immunizations do I need? ?Vaccines are usually given at various ages, according to a schedule. Your health care provider will recommend vaccines for you based on your age, medical history, and lifestyle or other factors, such as travel or where you work. ?What tests do I need? ?Screening ?Your health care provider may recommend screening tests for certain conditions. This may include: ?Lipid and cholesterol  levels. ?Diabetes screening. This is done by checking your blood sugar (glucose) after you have not eaten for a while (fasting). ?Hepatitis C test. ?Hepatitis B test. ?HIV (human immunodeficiency virus) test. ?STI (sexually transmitted infection) testing, if you are at risk. ?Lung cancer screening. ?Colorectal cancer screening. ?Prostate cancer screening. ?Abdominal aortic aneurysm (AAA) screening. You may need this if you are a current or former smoker. ?Talk with your health care provider about your test results, treatment options, and if necessary, the need for more tests. ?Follow these instructions at home: ?Eating and drinking ? ?Eat a diet that includes fresh fruits and vegetables, whole grains, lean protein, and low-fat dairy products. Limit your intake of foods with high amounts of sugar, saturated fats, and salt. ?Take vitamin and mineral supplements as recommended by your health care provider. ?Do not drink alcohol if your health care provider tells you not to drink. ?If you drink alcohol: ?Limit how much you have to 0-2 drinks a day. ?Know how much alcohol is in your drink. In the [place] one drink equals one 12 oz bottle of beer (355 mL), one 5 oz glass of wine (148 mL), or one 1? oz glass of hard liquor (44 mL). ?Lifestyle ?Brush your teeth every morning and night with fluoride toothpaste. Floss one time each day. ?Exercise for at least 30 minutes 5 or more days each week. ?Do not use any products that contain nicotine or tobacco. These products include cigarettes, chewing tobacco, and vaping devices, such as e-cigarettes. If you need help quitting, ask your health care provider. ?Do not use drugs. ?If you are sexually active, practice safe sex. Use a condom or other form of protection to prevent STIs. ?Take aspirin only  as told by your health care provider. Make sure that you understand how much to take and what form to take. Work with your health care provider to find out whether it is safe and  beneficial for you to take aspirin daily. ?Ask your health care provider if you need to take a cholesterol-lowering medicine (statin). ?Find healthy ways to manage stress, such as: ?Meditation, yoga, or listening to music. ?Journaling. ?Talking to a trusted person. ?Spending time with friends and family. ?Safety ?Always wear your seat belt while driving or riding in a vehicle. ?Do not drive: ?If you have been drinking alcohol. Do not ride with someone who has been drinking. ?When you are tired or distracted. ?While texting. ?If you have been using any mind-altering substances or drugs. ?Wear a helmet and other protective equipment during sports activities. ?If you have firearms in your house, make sure you follow all gun safety procedures. ?Minimize exposure to UV radiation to reduce your risk of skin cancer. ?What's next? ?Visit your health care provider once a year for an annual wellness visit. ?Ask your health care provider how often you should have your eyes and teeth checked. ?Stay up to date on all vaccines. ?This information is not intended to replace advice given to you by your health care provider. Make sure you discuss any questions you have with your health care provider. ?Document Revised: 01/03/2021 Document Reviewed: 01/03/2021 ?Elsevier Patient Education ? [place]. ? ?

## 2021-09-28 NOTE — Assessment & Plan Note (Signed)
Asymptomatic.  ?Appears euvolemic.  ? ?Continue furosemide 40 mg daily for lower extremity edema. ?

## 2021-09-28 NOTE — Progress Notes (Signed)
? ?Subjective:  ? ? Patient ID: Timothy Sheppard, male    DOB: September 30, 1945, 76 y.o.   MRN: [id number] ? ?HPI ? ?KENITH TRICKEL is a very pleasant 76 y.o. male who presents today for complete physical and follow up of chronic conditions. ? ?He is also requesting a medical letter [place] for routine foot care and his chronic neuropathy.  He has been receiving pedicures once monthly which has been extremely beneficial for his neuropathic pain and overall foot care.  He called his insurance company and they will cover the cost of his pedicures as long as he has a letter from his primary care provider. ? ? ?Immunizations: ?-Tetanus: 2015 ?-Influenza: Did not complete this season  ?-Covid-19: Has not completed ?-Shingles: Never completed, cannot afford.  ?-Pneumonia: Prevnar 13 in 2018, Pneumovax in 2019 ? ?Diet: Poor diet.  ?Exercise: No regular exercise., but does walk around [employer] ? ?Eye exam: Completed several years ago.  ?Dental exam: Completed several years ago.  ? ?Colonoscopy: Completed Cologuard in 2019 ?PSA: Due ? ? ?BP Readings from Last 3 Encounters:  ?09/28/21 128/74  ?08/29/20 116/60  ?05/08/20 129/84  ? ? ? ? ? ?Review of Systems  ?Constitutional:  Negative for unexpected weight change.  ?HENT:  Negative for rhinorrhea.   ?Respiratory:  Negative for cough and shortness of breath.   ?Cardiovascular:  Negative for chest pain.  ?Gastrointestinal:  Negative for constipation and diarrhea.  ?Genitourinary:  Negative for difficulty urinating.  ?Musculoskeletal:  Positive for arthralgias.  ?Skin:  Negative for rash.  ?Allergic/Immunologic: Negative for environmental allergies.  ?Neurological:  Positive for numbness. Negative for dizziness and headaches.  ?Psychiatric/Behavioral:  The patient is not nervous/anxious.   ? ?   ? ? ?Past Medical History:  ?Diagnosis Date  ? Basal cell carcinoma 05/13/2019  ? Drosum nose supratip - ED&C   ? CHF (congestive heart failure) ([place])   ? Chronic hip pain   ? Right   ? COVID-19 virus infection 03/31/2020  ? Elevated serum creatinine 07/21/2013  ? Erectile dysfunction   ? Hyperlipidemia   ? Hypertension   ? Iritis   ? Neuropathy   ? ? ?Social History  ? ?Socioeconomic History  ? Marital status: Married  ?  Spouse name: Not on file  ? Number of children: Not on file  ? Years of education: Not on file  ? Highest education level: Not on file  ?Occupational History  ? Not on file  ?Tobacco Use  ? Smoking status: Former  ?  Types: Pipe  ? Smokeless tobacco: Never  ? Tobacco comments:  ?  Quit 1 mo ago  ?Vaping Use  ? Vaping Use: Never used  ?Substance and Sexual Activity  ? Alcohol use: No  ? Drug use: No  ? Sexual activity: Not on file  ?Other Topics Concern  ? Not on file  ?Social History Narrative  ? Married.  ? 2 children, 2 grandchildren, 2 great grandchilden.  ? Retired.   ? ?Social Determinants of Health  ? ?Financial Resource Strain: Not on file  ?Food Insecurity: Not on file  ?Transportation Needs: Not on file  ?Physical Activity: Not on file  ?Stress: Not on file  ?Social Connections: Not on file  ?Intimate Partner Violence: Not on file  ? ? ?Past Surgical History:  ?Procedure Laterality Date  ? BACK SURGERY  2000  ? HERNIA REPAIR    ? ORIF FEMUR FRACTURE Right 1967  ? rod insertion  ? TONSILLECTOMY AND  ADENOIDECTOMY    ? ? ?Family History  ?Problem Relation Age of Onset  ? Heart failure Mother   ? Lupus Mother   ? Heart failure Father   ? ? ?No Known Allergies ? ?Current Outpatient Medications on File Prior to Visit  ?Medication Sig Dispense Refill  ? aspirin 81 MG tablet Take 81 mg by mouth daily.    ? diclofenac Sodium (VOLTAREN) 1 % GEL Apply 2 g topically 3 (three) times daily as needed. For pain. 100 g 0  ? furosemide (LASIX) 20 MG tablet TAKE 2 TABLETS (40 MG TOTAL) BY MOUTH DAILY. FOR LEG SWELLING. 180 tablet 0  ? gabapentin (NEURONTIN) 300 MG capsule Take 1 capsule by mouth in the morning and 3 capsules by mouth at bedtime for pain. Office visit required for further  refills. 360 capsule 0  ? sildenafil (VIAGRA) 100 MG tablet TAKE 1 TABLET BY MOUTH 30 MINUTES PRIOR TO INTERCOURSE AS NEEDED 30 tablet 0  ? tamsulosin (FLOMAX) 0.4 MG CAPS capsule TAKE 1 CAPSULE BY MOUTH IN THE MORNING FOR  URINE  STREAM 90 capsule 3  ? ?No current facility-administered medications on file prior to visit.  ? ? ?BP 128/74 (BP Location: Left Arm, Patient Position: Sitting, Cuff Size: Normal)   Pulse (!) 52   Temp 97.8 ?F (36.6 ?C) (Oral)   Resp 16   Ht '5\' 11"'$  (1.803 m)   Wt 274 lb 3.5 oz (124.4 kg)   SpO2 97%   BMI 38.25 kg/m?  ?Objective:  ? Physical Exam ?HENT:  ?   Right Ear: Tympanic membrane and ear canal normal.  ?   Left Ear: Tympanic membrane and ear canal normal.  ?   Nose: Nose normal.  ?   Right Sinus: No maxillary sinus tenderness or frontal sinus tenderness.  ?   Left Sinus: No maxillary sinus tenderness or frontal sinus tenderness.  ?Eyes:  ?   Conjunctiva/sclera: Conjunctivae normal.  ?Neck:  ?   Thyroid: No thyromegaly.  ?   Vascular: No carotid bruit.  ?Cardiovascular:  ?   Rate and Rhythm: Normal rate and regular rhythm.  ?   Heart sounds: Normal heart sounds.  ?Pulmonary:  ?   Effort: Pulmonary effort is normal.  ?   Breath sounds: Normal breath sounds. No wheezing or rales.  ?Abdominal:  ?   General: Bowel sounds are normal.  ?   Palpations: Abdomen is soft.  ?   Tenderness: There is no abdominal tenderness.  ?Musculoskeletal:     ?   General: Normal range of motion.  ?   Cervical back: Neck supple.  ?Skin: ?   General: Skin is warm and dry.  ?Neurological:  ?   Mental Status: He is alert and oriented to person, place, and time.  ?   Cranial Nerves: No cranial nerve deficit.  ?   Deep Tendon Reflexes: Reflexes are normal and symmetric.  ?Psychiatric:     ?   Mood and Affect: Mood normal.  ? ? ? ? ? ?   ?Assessment & Plan:  ? ? ? ? ?This visit occurred during the SARS-CoV-2 public health emergency.  Safety protocols were in place, including screening questions prior to the  visit, additional usage of staff PPE, and extensive cleaning of exam room while observing appropriate contact time as indicated for disinfecting solutions.  ?

## 2021-09-28 NOTE — Assessment & Plan Note (Signed)
Cannot afford Shingrix.  Other vaccines up-to-date. ?PSA due and pending. ? ?Cologuard due, orders placed.  He declines colonoscopy. ? ?Discussed the importance of a healthy diet and regular exercise in order for weight loss, and to reduce the risk of further co-morbidity. ? ?Exam today stable. ?Labs pending. ?

## 2021-09-28 NOTE — Assessment & Plan Note (Signed)
Controlled. ? ?Continue off antihypertensive medications.  ?Continue to monitor.  ?

## 2021-09-28 NOTE — Assessment & Plan Note (Signed)
Controlled. ? ?Continue gabapentin 300 mg in AM and 900 mg in PM.  ? ? ?

## 2021-09-28 NOTE — Assessment & Plan Note (Signed)
Discussed the importance of a healthy diet and regular exercise in order for weight loss, and to reduce the risk of further co-morbidity.  Repeat A1c pending. 

## 2021-09-28 NOTE — Assessment & Plan Note (Signed)
Appears controlled. ? ?Repeat iron studies and CBC pending. ? ?Continue oral iron twice daily for now. ?

## 2021-09-28 NOTE — Assessment & Plan Note (Signed)
Controlled.  ? ?Continue Flomax 0.4 mg daily. ?

## 2021-09-28 NOTE — Assessment & Plan Note (Signed)
Controlled. ? ?Continue sildenafil 100 mg as needed. ?

## 2021-10-03 ENCOUNTER — Telehealth: Payer: Self-pay

## 2021-10-03 NOTE — Telephone Encounter (Signed)
Tried to call patient  didn't have voicemail.  ?

## 2021-10-03 NOTE — Telephone Encounter (Signed)
-----   Message from [name], NP sent at 09/28/2021  5:58 PM EST ----- ? ?Please call patient: Not sure if he uses MyChart. ? ?Kidneys, liver, electrolytes, iron levels, blood counts, prostate, blood sugar, cholesterol look good ?

## 2021-10-04 ENCOUNTER — Other Ambulatory Visit: Payer: Self-pay | Admitting: Primary Care

## 2021-10-05 DIAGNOSIS — Z1211 Encounter for screening for malignant neoplasm of colon: Secondary | ICD-10-CM | POA: Diagnosis not present

## 2021-10-12 LAB — COLOGUARD: COLOGUARD: NEGATIVE

## 2021-10-17 ENCOUNTER — Other Ambulatory Visit: Payer: Self-pay | Admitting: Primary Care

## 2021-10-17 DIAGNOSIS — R609 Edema, unspecified: Secondary | ICD-10-CM

## 2021-10-30 ENCOUNTER — Other Ambulatory Visit: Payer: Self-pay | Admitting: Primary Care

## 2021-10-30 DIAGNOSIS — N529 Male erectile dysfunction, unspecified: Secondary | ICD-10-CM

## 2021-11-15 DIAGNOSIS — M1611 Unilateral primary osteoarthritis, right hip: Secondary | ICD-10-CM | POA: Diagnosis not present

## 2021-11-15 DIAGNOSIS — M25551 Pain in right hip: Secondary | ICD-10-CM | POA: Diagnosis not present

## 2021-12-04 ENCOUNTER — Telehealth: Payer: Self-pay | Admitting: Primary Care

## 2021-12-04 NOTE — Telephone Encounter (Signed)
Spoke with patient to schedule AWV. ? ?He wanted to let you know he is having surgery 01/28/22 to Remove pin/rod in right hip and total hip replacement.  Dr Paulo Fruit @  [facility] will be doing surgery @ [place] ? ? ?

## 2021-12-04 NOTE — Telephone Encounter (Signed)
Noted. ? ?Please have him notify us if he needs surgical clearance.  He would likely need updated labs and an ECG if this is the case. ?

## 2021-12-05 NOTE — Telephone Encounter (Signed)
Called patient did not think he needs but will call our office to set something up if he does.  ?

## 2021-12-06 ENCOUNTER — Ambulatory Visit (INDEPENDENT_AMBULATORY_CARE_PROVIDER_SITE_OTHER): Payer: Medicare Other

## 2021-12-06 VITALS — Ht 71.0 in | Wt 274.0 lb

## 2021-12-06 DIAGNOSIS — Z0000 Encounter for general adult medical examination without abnormal findings: Secondary | ICD-10-CM

## 2021-12-06 NOTE — Progress Notes (Signed)
Subjective:   Timothy Sheppard is a 76 y.o. male who presents for Medicare Annual/Subsequent preventive examination.  Review of Systems    Virtual Visit via Telephone Note  I connected with  Timothy Sheppard on 12/06/21 at  1:30 PM EDT by telephone and verified that I am speaking with the correct person using two identifiers.  Location: Patient: Home Provider: Office Persons participating in the virtual visit: patient/Nurse Health Advisor   I discussed the limitations, risks, security and privacy concerns of performing an evaluation and management service by telephone and the availability of in person appointments. The patient expressed understanding and agreed to proceed.  Interactive audio and video telecommunications were attempted between this nurse and patient, however failed, due to patient having technical difficulties OR patient did not have access to video capability.  We continued and completed visit with audio only.  Some vital signs may be absent or patient reported.   Criselda Peaches, LPN  Cardiac Risk Factors include: advanced age (>69mn, >>71women);hypertension;male gender     Objective:    Today's Vitals   12/06/21 1358  Weight: 274 lb (124.3 kg)  Height: '5\' 11"'$  (1.803 m)   Body mass index is 38.22 kg/m.     12/06/2021    2:05 PM 05/08/2020   10:39 AM 11/02/2019    3:32 PM 06/08/2018   11:31 AM 11/25/2017   10:28 AM 07/18/2017    8:36 AM 06/09/2017    8:51 AM  Advanced Directives  Does Patient Have a Medical Advance Directive? Yes No Yes Yes No No No  Type of [place] will  [place] will [place] will     Does patient want to make changes to medical advance directive? No - Patient declined        Copy of [place] Chart? No - copy requested  No - copy requested No - copy requested     Would patient like information on creating a  medical advance directive?  No - Patient declined   No - Patient declined No - Patient declined No - Patient declined    Current Medications (verified) Outpatient Encounter Medications as of 12/06/2021  Medication Sig   aspirin 81 MG tablet Take 81 mg by mouth daily.   diclofenac Sodium (VOLTAREN) 1 % GEL Apply 2 g topically 3 (three) times daily as needed. For pain.   furosemide (LASIX) 20 MG tablet TAKE 2 TABLETS (40 MG TOTAL) BY MOUTH DAILY. FOR LEG SWELLING.   gabapentin (NEURONTIN) 300 MG capsule TAKE 1 CAPSULE IN THE MORNING AND 3 CAPS AT BEDTIME FOR PAIN   sildenafil (VIAGRA) 100 MG tablet TAKE 1 TABLET BY MOUTH 30 MINUTES PRIOR TO INTERCOURSE AS NEEDED   tamsulosin (FLOMAX) 0.4 MG CAPS capsule TAKE 1 CAPSULE BY MOUTH IN THE MORNING FOR  URINE  STREAM   No facility-administered encounter medications on file as of 12/06/2021.    Allergies (verified) Patient has no known allergies.   History: Past Medical History:  Diagnosis Date   Basal cell carcinoma 05/13/2019   Drosum nose supratip - ED&C    CHF (congestive heart failure) (HCC)    Chronic hip pain    Right   COVID-19 virus infection 03/31/2020   Elevated serum creatinine 07/21/2013   Erectile dysfunction    Hyperlipidemia    Hypertension    Iritis    Neuropathy    Past Surgical History:  Procedure  Laterality Date   BACK SURGERY  2000   HERNIA REPAIR     ORIF FEMUR FRACTURE Right 1967   rod insertion   TONSILLECTOMY AND ADENOIDECTOMY     Family History  Problem Relation Age of Onset   Heart failure Mother    Lupus Mother    Heart failure Father    Social History   Socioeconomic History   Marital status: Married    Spouse name: Not on file   Number of children: Not on file   Years of education: Not on file   Highest education level: Not on file  Occupational History   Not on file  Tobacco Use   Smoking status: Former    Types: Pipe   Smokeless tobacco: Never   Tobacco comments:    Quit 1 mo ago   Vaping Use   Vaping Use: Never used  Substance and Sexual Activity   Alcohol use: No   Drug use: No   Sexual activity: Not on file  Other Topics Concern   Not on file  Social History Narrative   Married.   2 children, 2 grandchildren, 2 great grandchilden.   Retired.    Social Determinants of Health   Financial Resource Strain: Low Risk    Difficulty of Paying Living Expenses: Not hard at all  Food Insecurity: No Food Insecurity   Worried About Charity fundraiser in the Last Year: Never true   [place] in the Last Year: Never true  Transportation Needs: No Transportation Needs   Lack of Transportation (Medical): No   Lack of Transportation (Non-Medical): No  Physical Activity: Insufficiently Active   Days of Exercise per Week: 3 days   Minutes of Exercise per Session: 30 min  Stress: No Stress Concern Present   Feeling of Stress : Not at all  Social Connections: Socially Integrated   Frequency of Communication with Friends and Family: More than three times a week   Frequency of Social Gatherings with Friends and Family: More than three times a week   Attends Religious Services: More than 4 times per year   Active Member of [employer] or Organizations: Yes   Attends Music therapist: More than 4 times per year   Marital Status: Married    Tobacco Counseling Counseling given: Not Answered Tobacco comments: Quit 1 mo ago   Clinical Intake Pre-visit preparation completed: No  Diabetic?  No  Activities of Daily Living    12/06/2021    2:03 PM  In your present state of health, do you have any difficulty performing the following activities:  Hearing? 0  Vision? 0  Difficulty concentrating or making decisions? 0  Walking or climbing stairs? 0  Dressing or bathing? 0  Preparing Food and eating ? N  Using the Toilet? N  In the past six months, have you accidently leaked urine? N  Do you have problems with loss of bowel control? N  Managing your  Medications? N  Managing your Finances? N  Housekeeping or managing your Housekeeping? N    Patient Care Team: [name], NP as PCP - General (Internal Medicine)  Indicate any recent Medical Services you may have received from other than [facility] providers in the past year (date may be approximate).     Assessment:   This is a routine wellness examination for Timothy Sheppard.  Hearing/Vision screen Hearing Screening - Comments:: No hearing difficulty Vision Screening - Comments:: No vision difficulty  Dietary issues and  exercise activities discussed: Exercise limited by: None identified   Goals Addressed               This Visit's Progress     Increase physical activity (pt-stated)        Get back to doing yard work.       Depression Screen    12/06/2021    2:01 PM 11/02/2019    3:35 PM 06/08/2018   11:31 AM 11/25/2017   10:28 AM 07/18/2017    8:36 AM 06/09/2017    8:51 AM 05/05/2017    2:22 PM  PHQ 2/9 Scores  PHQ - 2 Score 0 0 0 0 0 0 0  PHQ- 9 Score  0 0        Fall Risk    12/06/2021    2:04 PM 11/02/2019    3:33 PM 06/08/2018   11:31 AM 11/25/2017   10:28 AM 07/18/2017    8:36 AM  Fall Risk   Falls in the past year? 0 1 0 No No  Comment  fell out of bed     Number falls in past yr: 0 0     Injury with Fall? 0 0     Risk for fall due to : No Fall Risks Medication side effect     Follow up  Falls evaluation completed;Falls prevention discussed       FALL RISK PREVENTION PERTAINING TO THE HOME:  Any stairs in or around the home? Yes  If so, are there any without handrails? No  Home free of loose throw rugs in walkways, pet beds, electrical cords, etc? Yes  Adequate lighting in your home to reduce risk of falls? Yes   ASSISTIVE DEVICES UTILIZED TO PREVENT FALLS:  Life alert? No  Use of a cane, walker or w/c? Yes  Grab bars in the bathroom? Yes  Shower chair or bench in shower? Yes  Elevated toilet seat or a handicapped toilet? No   TIMED UP AND  GO:  Was the test performed? No . Audio Visit  Cognitive Function:    11/02/2019    3:38 PM 06/08/2018   11:31 AM  MMSE - Mini Mental State Exam  Orientation to time 5 5  Orientation to Place 5 5  Registration 3 3  Attention/ Calculation 5 0  Recall 3 3  Language- name 2 objects  0  Language- repeat 1 1  Language- follow 3 step command  3  Language- read & follow direction  0  Write a sentence  0  Copy design  0  Total score  20        12/06/2021    2:05 PM  6CIT Screen  What Year? 0 points  What month? 0 points  What time? 0 points  Count back from 20 0 points  Months in reverse 0 points  Repeat phrase 0 points  Total Score 0 points    Immunizations Immunization History  Administered Date(s) Administered   Fluad Quad(high Dose 65+) 05/03/2020   Influenza,inj,Quad PF,6+ Mos 04/21/2013, 04/11/2015, 04/02/2018   Influenza-Unspecified 04/14/2017   Pneumococcal Conjugate-13 06/09/2017   Pneumococcal Polysaccharide-23 06/08/2018   Tdap 05/11/2014      Flu Vaccine status: Declined, Education has been provided regarding the importance of this vaccine but patient still declined. Advised may receive this vaccine at local pharmacy or Health Dept. Aware to provide a copy of the vaccination record if obtained from local pharmacy or Health Dept. Verbalized acceptance and understanding.  Pneumococcal  vaccine status: Up to date  Covid-19 vaccine status: Declined, Education has been provided regarding the importance of this vaccine but patient still declined. Advised may receive this vaccine at local pharmacy or Health Dept.or vaccine clinic. Aware to provide a copy of the vaccination record if obtained from local pharmacy or Health Dept. Verbalized acceptance and understanding.  Qualifies for Shingles Vaccine? Yes   Zostavax completed No   Shingrix Completed?: No.    Education has been provided regarding the importance of this vaccine. Patient has been advised to call  insurance company to determine out of pocket expense if they have not yet received this vaccine. Advised may also receive vaccine at local pharmacy or Health Dept. Verbalized acceptance and understanding.  Screening Tests Health Maintenance  Topic Date Due   COVID-19 Vaccine (1) 12/22/2021 (Originally 08/09/1946)   Zoster Vaccines- Shingrix (1 of 2) 03/08/2022 (Originally 02/07/1965)   INFLUENZA VACCINE  02/19/2022   TETANUS/TDAP  05/11/2024   Fecal DNA (Cologuard)  10/05/2024   Pneumonia Vaccine 59+ Years old  Completed   Hepatitis C Screening  Completed   HPV VACCINES  Aged Out    Health Maintenance  There are no preventive care reminders to display for this patient.   Colorectal cancer screening: Type of screening: Cologuard. Completed 10/05/21. Repeat every 3 years  Lung Cancer Screening: (Low Dose CT Chest recommended if Age 70-80 years, 30 pack-year currently smoking OR have quit w/in 15years.) does qualify.   Lung Cancer Screening Referral: Patient deferred  Additional Screening:  Hepatitis C Screening: does qualify; Completed 09/04/18  Vision Screening: Recommended annual ophthalmology exams for early detection of glaucoma and other disorders of the eye. Is the patient up to date with their annual eye exam?  No  Who is the provider or what is the name of the office in which the patient attends annual eye exams? Patient deferred If pt is not established with a provider, would they like to be referred to a provider to establish care? No .   Dental Screening: Recommended annual dental exams for proper oral hygiene  Community Resource Referral / Chronic Care Management:  CRR required this visit?  No   CCM required this visit?  No      Plan:     I have personally reviewed and noted the following in the patient's chart:   Medical and social history Use of alcohol, tobacco or illicit drugs  Current medications and supplements including opioid prescriptions. Patient is  not currently taking opioid prescriptions. Functional ability and status Nutritional status Physical activity Advanced directives List of other physicians Hospitalizations, surgeries, and ER visits in previous 12 months Vitals Screenings to include cognitive, depression, and falls Referrals and appointments  In addition, I have reviewed and discussed with patient certain preventive protocols, quality metrics, and best practice recommendations. A written personalized care plan for preventive services as well as general preventive health recommendations were provided to patient.     Criselda Peaches, LPN   [id number]   Nurse Notes: None

## 2021-12-06 NOTE — Patient Instructions (Addendum)
[name] , Thank you for taking time to come for your Medicare Wellness Visit. I appreciate your ongoing commitment to your health goals. Please review the following plan we discussed and let me know if I can assist you in the future.   These are the goals we discussed:  Goals       Increase physical activity      Starting 06/08/2018, I will continue to walk at least 60 minutes every other day.        Increase physical activity (pt-stated)      Get back to doing yard work.      Patient Stated      11/02/2019, I will maintain and continue medications as prescribed.         This is a list of the screening recommended for you and due dates:  Health Maintenance  Topic Date Due   COVID-19 Vaccine (1) 12/22/2021*   Zoster (Shingles) Vaccine (1 of 2) 03/08/2022*   Flu Shot  02/19/2022   Tetanus Vaccine  05/11/2024   Cologuard (Stool DNA test)  10/05/2024   Pneumonia Vaccine  Completed   Hepatitis C Screening: USPSTF Recommendation to screen - Ages 62-79 yo.  Completed   HPV Vaccine  Aged Out  *Topic was postponed. The date shown is not the original due date.    Advanced directives: Yes  Conditions/risks identified: None  Next appointment: Follow up in one year for your annual wellness visit.    Preventive Care 50 Years and Older, Male Preventive care refers to lifestyle choices and visits with your health care provider that can promote health and wellness. What does preventive care include? A yearly physical exam. This is also called an annual well check. Dental exams once or twice a year. Routine eye exams. Ask your health care provider how often you should have your eyes checked. Personal lifestyle choices, including: Daily care of your teeth and gums. Regular physical activity. Eating a healthy diet. Avoiding tobacco and drug use. Limiting alcohol use. Practicing safe sex. Taking low doses of aspirin every day. Taking vitamin and mineral supplements as recommended  by your health care provider. What happens during an annual well check? The services and screenings done by your health care provider during your annual well check will depend on your age, overall health, lifestyle risk factors, and family history of disease. Counseling  Your health care provider may ask you questions about your: Alcohol use. Tobacco use. Drug use. Emotional well-being. Home and relationship well-being. Sexual activity. Eating habits. History of falls. Memory and ability to understand (cognition). Work and work Statistician. Screening  You may have the following tests or measurements: Height, weight, and BMI. Blood pressure. Lipid and cholesterol levels. These may be checked every 5 years, or more frequently if you are over 10 years old. Skin check. Lung cancer screening. You may have this screening every year starting at age 41 if you have a 30-pack-year history of smoking and currently smoke or have quit within the past 15 years. Fecal occult blood test (FOBT) of the stool. You may have this test every year starting at age 70. Flexible sigmoidoscopy or colonoscopy. You may have a sigmoidoscopy every 5 years or a colonoscopy every 10 years starting at age 6. Prostate cancer screening. Recommendations will vary depending on your family history and other risks. Hepatitis C blood test. Hepatitis B blood test. Sexually transmitted disease (STD) testing. Diabetes screening. This is done by checking your blood sugar (glucose) after you  have not eaten for a while (fasting). You may have this done every 1-3 years. Abdominal aortic aneurysm (AAA) screening. You may need this if you are a current or former smoker. Osteoporosis. You may be screened starting at age 64 if you are at high risk. Talk with your health care provider about your test results, treatment options, and if necessary, the need for more tests. Vaccines  Your health care provider may recommend certain  vaccines, such as: Influenza vaccine. This is recommended every year. Tetanus, diphtheria, and acellular pertussis (Tdap, Td) vaccine. You may need a Td booster every 10 years. Zoster vaccine. You may need this after age 19. Pneumococcal 13-valent conjugate (PCV13) vaccine. One dose is recommended after age 53. Pneumococcal polysaccharide (PPSV23) vaccine. One dose is recommended after age 80. Talk to your health care provider about which screenings and vaccines you need and how often you need them. This information is not intended to replace advice given to you by your health care provider. Make sure you discuss any questions you have with your health care provider. Document Released: 08/04/2015 Document Revised: 03/27/2016 Document Reviewed: 05/09/2015 Elsevier Interactive Patient Education  2017 [employer] Prevention in the Home Falls can cause injuries. They can happen to people of all ages. There are many things you can do to make your home safe and to help prevent falls. What can I do on the outside of my home? Regularly fix the edges of walkways and driveways and fix any cracks. Remove anything that might make you trip as you walk through a door, such as a raised step or threshold. Trim any bushes or trees on the path to your home. Use bright outdoor lighting. Clear any walking paths of anything that might make someone trip, such as rocks or tools. Regularly check to see if handrails are loose or broken. Make sure that both sides of any steps have handrails. Any raised decks and porches should have guardrails on the edges. Have any leaves, snow, or ice cleared regularly. Use sand or salt on walking paths during winter. Clean up any spills in your garage right away. This includes oil or grease spills. What can I do in the bathroom? Use night lights. Install grab bars by the toilet and in the tub and shower. Do not use towel bars as grab bars. Use non-skid mats or decals in  the tub or shower. If you need to sit down in the shower, use a plastic, non-slip stool. Keep the floor dry. Clean up any water that spills on the floor as soon as it happens. Remove soap buildup in the tub or shower regularly. Attach bath mats securely with double-sided non-slip rug tape. Do not have throw rugs and other things on the floor that can make you trip. What can I do in the bedroom? Use night lights. Make sure that you have a light by your bed that is easy to reach. Do not use any sheets or blankets that are too big for your bed. They should not hang down onto the floor. Have a firm chair that has side arms. You can use this for support while you get dressed. Do not have throw rugs and other things on the floor that can make you trip. What can I do in the kitchen? Clean up any spills right away. Avoid walking on wet floors. Keep items that you use a lot in easy-to-reach places. If you need to reach something above you, use a strong step  stool that has a grab bar. Keep electrical cords out of the way. Do not use floor polish or wax that makes floors slippery. If you must use wax, use non-skid floor wax. Do not have throw rugs and other things on the floor that can make you trip. What can I do with my stairs? Do not leave any items on the stairs. Make sure that there are handrails on both sides of the stairs and use them. Fix handrails that are broken or loose. Make sure that handrails are as long as the stairways. Check any carpeting to make sure that it is firmly attached to the stairs. Fix any carpet that is loose or worn. Avoid having throw rugs at the top or bottom of the stairs. If you do have throw rugs, attach them to the floor with carpet tape. Make sure that you have a light switch at the top of the stairs and the bottom of the stairs. If you do not have them, ask someone to add them for you. What else can I do to help prevent falls? Wear shoes that: Do not have high  heels. Have rubber bottoms. Are comfortable and fit you well. Are closed at the toe. Do not wear sandals. If you use a stepladder: Make sure that it is fully opened. Do not climb a closed stepladder. Make sure that both sides of the stepladder are locked into place. Ask someone to hold it for you, if possible. Clearly mark and make sure that you can see: Any grab bars or handrails. First and last steps. Where the edge of each step is. Use tools that help you move around (mobility aids) if they are needed. These include: Canes. Walkers. Scooters. Crutches. Turn on the lights when you go into a dark area. Replace any light bulbs as soon as they burn out. Set up your furniture so you have a clear path. Avoid moving your furniture around. If any of your floors are uneven, fix them. If there are any pets around you, be aware of where they are. Review your medicines with your doctor. Some medicines can make you feel dizzy. This can increase your chance of falling. Ask your doctor what other things that you can do to help prevent falls. This information is not intended to replace advice given to you by your health care provider. Make sure you discuss any questions you have with your health care provider. Document Released: 05/04/2009 Document Revised: 12/14/2015 Document Reviewed: 08/12/2014 Elsevier Interactive Patient Education  2017 [employer].

## 2021-12-20 ENCOUNTER — Other Ambulatory Visit: Payer: Self-pay | Admitting: Orthopedic Surgery

## 2021-12-21 ENCOUNTER — Ambulatory Visit (INDEPENDENT_AMBULATORY_CARE_PROVIDER_SITE_OTHER): Payer: Medicare Other | Admitting: Primary Care

## 2021-12-21 ENCOUNTER — Encounter: Payer: Self-pay | Admitting: Primary Care

## 2021-12-21 VITALS — BP 126/88 | HR 97 | Temp 98.7°F | Ht 71.0 in | Wt 270.0 lb

## 2021-12-21 DIAGNOSIS — Z01818 Encounter for other preprocedural examination: Secondary | ICD-10-CM

## 2021-12-21 HISTORY — DX: Encounter for other preprocedural examination: Z01.818

## 2021-12-21 NOTE — Assessment & Plan Note (Signed)
Pending right anterior hip arthoplasty for 01/28/22 per Dr. Mayer Camel, [place].  Labs reviewed from his visit 2 months ago, all stable. He will have preoperative labs before surgery.  ECG today with sinus bradycardia with rate of 58. No acute ST changes or PAC/PVC Appears similar and improved to prior ECG from 2021. Cleared for surgery from medical and cardiac standpoint.

## 2021-12-21 NOTE — Progress Notes (Signed)
Subjective:    Patient ID: Timothy Sheppard, male    DOB: Dec 08, 1945, 76 y.o.   MRN: [id number]  HPI  Timothy Sheppard is a very pleasant 76 y.o. male with a history of hypertension, CHF, BPH, chest pain, palpitations, prediabetes, chronic right hip and lower extremity pain, neuropathy who presents today for preoperative clearance.  He is pending right anterior hip arthroplasty per [facility].  He is needing medical and cardiology clearance.  Chronic history of right hip pain. He fell about 1 year ago, feel down 5 steps. Gradual right hip pain over the year, reduced ability to walk. Evaluated by orthopedics about 1 month ago who found that his right lower extremity rod is shattered. He is scheduled for surgery on 01/28/22.   He is ambulating with crutches, canes, and a walker. He is taking Tylenol, Ibuprofen, Tramadol with improvement in pain.   BP Readings from Last 3 Encounters:  12/21/21 126/88  09/28/21 128/74  08/29/20 116/60      Review of Systems  Constitutional:  Negative for unexpected weight change.  HENT:  Negative for rhinorrhea.   Respiratory:  Negative for shortness of breath.   Cardiovascular:  Negative for chest pain.  Gastrointestinal:  Negative for constipation and diarrhea.  Genitourinary:  Negative for difficulty urinating.  Musculoskeletal:  Positive for arthralgias and myalgias.  Skin:  Negative for rash.  Allergic/Immunologic: Negative for environmental allergies.  Neurological:  Positive for numbness. Negative for dizziness and headaches.        Past Medical History:  Diagnosis Date   Basal cell carcinoma 05/13/2019   Drosum nose supratip - ED&C    CHF (congestive heart failure) (HCC)    Chronic hip pain    Right   COVID-19 virus infection 03/31/2020   Elevated serum creatinine 07/21/2013   Erectile dysfunction    Hyperlipidemia    Hypertension    Iritis    Neuropathy     Social History   Socioeconomic History   Marital  status: Married    Spouse name: Not on file   Number of children: Not on file   Years of education: Not on file   Highest education level: Not on file  Occupational History   Not on file  Tobacco Use   Smoking status: Former    Types: Pipe   Smokeless tobacco: Never   Tobacco comments:    Quit 1 mo ago  Vaping Use   Vaping Use: Never used  Substance and Sexual Activity   Alcohol use: No   Drug use: No   Sexual activity: Not on file  Other Topics Concern   Not on file  Social History Narrative   Married.   2 children, 2 grandchildren, 2 great grandchilden.   Retired.    Social Determinants of Health   Financial Resource Strain: Low Risk    Difficulty of Paying Living Expenses: Not hard at all  Food Insecurity: No Food Insecurity   Worried About Charity fundraiser in the Last Year: Never true   [place] in the Last Year: Never true  Transportation Needs: No Transportation Needs   Lack of Transportation (Medical): No   Lack of Transportation (Non-Medical): No  Physical Activity: Insufficiently Active   Days of Exercise per Week: 3 days   Minutes of Exercise per Session: 30 min  Stress: No Stress Concern Present   Feeling of Stress : Not at all  Social Connections: Socially Integrated   Frequency of Communication  with Friends and Family: More than three times a week   Frequency of Social Gatherings with Friends and Family: More than three times a week   Attends Religious Services: More than 4 times per year   Active Member of [employer] or Organizations: Yes   Attends Music therapist: More than 4 times per year   Marital Status: Married  Human resources officer Violence: Not At Risk   Fear of Current or Ex-Partner: No   Emotionally Abused: No   Physically Abused: No   Sexually Abused: No    Past Surgical History:  Procedure Laterality Date   BACK SURGERY  2000   HERNIA REPAIR     ORIF FEMUR FRACTURE Right 1967   rod insertion   TONSILLECTOMY AND  ADENOIDECTOMY      Family History  Problem Relation Age of Onset   Heart failure Mother    Lupus Mother    Heart failure Father     No Known Allergies  Current Outpatient Medications on File Prior to Visit  Medication Sig Dispense Refill   aspirin 81 MG tablet Take 81 mg by mouth daily.     diclofenac Sodium (VOLTAREN) 1 % GEL Apply 2 g topically 3 (three) times daily as needed. For pain. 100 g 0   furosemide (LASIX) 20 MG tablet TAKE 2 TABLETS (40 MG TOTAL) BY MOUTH DAILY. FOR LEG SWELLING. 180 tablet 3   gabapentin (NEURONTIN) 300 MG capsule TAKE 1 CAPSULE IN THE MORNING AND 3 CAPS AT BEDTIME FOR PAIN 360 capsule 3   sildenafil (VIAGRA) 100 MG tablet TAKE 1 TABLET BY MOUTH 30 MINUTES PRIOR TO INTERCOURSE AS NEEDED 30 tablet 0   tamsulosin (FLOMAX) 0.4 MG CAPS capsule TAKE 1 CAPSULE BY MOUTH IN THE MORNING FOR  URINE  STREAM 90 capsule 3   traMADol (ULTRAM) 50 MG tablet Take 50 mg by mouth every 6 (six) hours as needed.     No current facility-administered medications on file prior to visit.    BP 126/88   Pulse 97   Temp 98.7 F (37.1 C)   Ht '5\' 11"'$  (1.803 m)   Wt 270 lb (122.5 kg)   SpO2 98%   BMI 37.66 kg/m  Objective:   Physical Exam HENT:     Right Ear: Tympanic membrane and ear canal normal.     Left Ear: Tympanic membrane and ear canal normal.     Nose: Nose normal.     Right Sinus: No maxillary sinus tenderness or frontal sinus tenderness.     Left Sinus: No maxillary sinus tenderness or frontal sinus tenderness.  Eyes:     Conjunctiva/sclera: Conjunctivae normal.  Neck:     Thyroid: No thyromegaly.     Vascular: No carotid bruit.  Cardiovascular:     Rate and Rhythm: Normal rate and regular rhythm.     Heart sounds: Normal heart sounds.  Pulmonary:     Effort: Pulmonary effort is normal.     Breath sounds: Normal breath sounds. No wheezing or rales.  Abdominal:     General: Bowel sounds are normal.     Palpations: Abdomen is soft.     Tenderness:  There is no abdominal tenderness.  Musculoskeletal:     Cervical back: Neck supple.     Left hip: Decreased range of motion.     Left upper leg: Tenderness present.       Legs:  Skin:    General: Skin is warm and dry.  Neurological:  Mental Status: He is alert and oriented to person, place, and time.     Cranial Nerves: No cranial nerve deficit.     Deep Tendon Reflexes: Reflexes are normal and symmetric.  Psychiatric:        Mood and Affect: Mood normal.          Assessment & Plan:

## 2021-12-21 NOTE — Patient Instructions (Signed)
It was a pleasure to see you today!   

## 2021-12-26 ENCOUNTER — Telehealth: Payer: Self-pay

## 2021-12-26 ENCOUNTER — Telehealth: Payer: Self-pay | Admitting: Primary Care

## 2021-12-26 NOTE — Telephone Encounter (Signed)
Reita Cliche (Spouse) was calling about a surgery clearance for her husband, she wants to speak with Joellen about the next step for his upcoming surgery in July. Please return the call back when possible, thanks.  Callback Number: [phone]

## 2021-12-26 NOTE — Telephone Encounter (Signed)
I am not sure what medication she is referring to. Can we get some additional information regarding his symptoms?

## 2021-12-26 NOTE — Telephone Encounter (Signed)
Patient's wife is calling in stating that Timothy Sheppard feet are really swollen. Wanting to know if they can change the medication without an appointment.

## 2021-12-28 NOTE — Telephone Encounter (Addendum)
I would work on elevating legs, wearing compression socks, try to increase walking if tolerable.  I do not recommend a dose increase of his fluid pill at this time.  If his swelling persist then he needs an office visit.

## 2021-12-28 NOTE — Telephone Encounter (Signed)
Called patient not further action needed he has received all forms.

## 2021-12-28 NOTE — Telephone Encounter (Signed)
Called patient states that he is on fluid pill. Has had increased swelling at times did not know if he could take more of the fluid pill. This swelling has increased over time while he is not moving as much. He has been trying to elevate his feet and that helps.

## 2022-01-03 NOTE — Telephone Encounter (Signed)
No answer no voice mail  

## 2022-01-03 NOTE — Telephone Encounter (Signed)
Patient returned phone call. °

## 2022-01-07 ENCOUNTER — Encounter: Payer: Self-pay | Admitting: Primary Care

## 2022-01-07 NOTE — Telephone Encounter (Signed)
Called patient reviewed all information and repeated back to me. Will call if any questions.  He will get some compression socks. He has been elevated and walking as tolerated.  Will call if any questions.

## 2022-01-08 DIAGNOSIS — T8484XA Pain due to internal orthopedic prosthetic devices, implants and grafts, initial encounter: Secondary | ICD-10-CM | POA: Diagnosis not present

## 2022-01-08 DIAGNOSIS — M1611 Unilateral primary osteoarthritis, right hip: Secondary | ICD-10-CM | POA: Diagnosis not present

## 2022-01-08 DIAGNOSIS — M25551 Pain in right hip: Secondary | ICD-10-CM | POA: Diagnosis not present

## 2022-01-15 NOTE — Progress Notes (Addendum)
Anesthesia Review:  PCP: Tera Helper 12/21/21- OV  for preo0p clearance  Cardiologist :   none  Chest x-ray : EKG : 12/21/21  Echo : Stress test: Cardiac Cath :  Activity level: can do a flgiht of stairs wtihout difficulty  Sleep Study/ CPAP : has sleep apnea could not tolerate cpap  Fasting Blood Sugar :      / Checks Blood Sugar -- times a day:   Blood Thinner/ Instructions /Last Dose: ASA / Instructions/ Last Dose :   81 mg aspirin  PT reports at preop " I have to be laid back to have blood drawn or I will pass out".  At time of blood draw at preop pt sat in chair and another venipuncturist talked with him while blood was drawn.  PT tolerated fine.  Blood pressure after lab draw was 154/81 and pulse 51.  PT voiced no complaints.  PT placed in wheelchair and taken for CXR. BMP done 01/17/22 routed to Dr Mayer Camel. Potassium 3.2

## 2022-01-17 ENCOUNTER — Other Ambulatory Visit: Payer: Self-pay

## 2022-01-17 ENCOUNTER — Ambulatory Visit (HOSPITAL_COMMUNITY)
Admission: RE | Admit: 2022-01-17 | Discharge: 2022-01-17 | Disposition: A | Discharge status: Home / Self Care | Payer: Medicare Other | Source: Ambulatory Visit | Attending: Orthopedic Surgery | Admitting: Orthopedic Surgery

## 2022-01-17 ENCOUNTER — Encounter (HOSPITAL_COMMUNITY)
Admission: RE | Admit: 2022-01-17 | Discharge: 2022-01-17 | Disposition: A | Discharge status: Home / Self Care | Payer: Medicare Other | Source: Ambulatory Visit | Attending: Orthopedic Surgery | Admitting: Orthopedic Surgery

## 2022-01-17 ENCOUNTER — Encounter (HOSPITAL_COMMUNITY): Payer: Self-pay

## 2022-01-17 VITALS — BP 149/91 | HR 59 | Temp 98.3°F | Resp 16 | Ht 70.0 in | Wt 274.0 lb

## 2022-01-17 DIAGNOSIS — Z87891 Personal history of nicotine dependence: Secondary | ICD-10-CM | POA: Diagnosis not present

## 2022-01-17 DIAGNOSIS — R001 Bradycardia, unspecified: Secondary | ICD-10-CM | POA: Diagnosis not present

## 2022-01-17 DIAGNOSIS — I509 Heart failure, unspecified: Secondary | ICD-10-CM | POA: Insufficient documentation

## 2022-01-17 DIAGNOSIS — M1611 Unilateral primary osteoarthritis, right hip: Secondary | ICD-10-CM | POA: Diagnosis not present

## 2022-01-17 DIAGNOSIS — G4730 Sleep apnea, unspecified: Secondary | ICD-10-CM | POA: Insufficient documentation

## 2022-01-17 DIAGNOSIS — Z01818 Encounter for other preprocedural examination: Secondary | ICD-10-CM | POA: Diagnosis not present

## 2022-01-17 DIAGNOSIS — R918 Other nonspecific abnormal finding of lung field: Secondary | ICD-10-CM | POA: Diagnosis not present

## 2022-01-17 HISTORY — DX: Sleep apnea, unspecified: G47.30

## 2022-01-17 HISTORY — DX: Personal history of urinary calculi: Z87.442

## 2022-01-17 HISTORY — DX: Unspecified osteoarthritis, unspecified site: M19.90

## 2022-01-17 LAB — CBC
HCT: 40.2 % (ref 39.0–52.0)
Hemoglobin: 13.5 g/dL (ref 13.0–17.0)
MCH: 32 pg (ref 26.0–34.0)
MCHC: 33.6 g/dL (ref 30.0–36.0)
MCV: 95.3 fL (ref 80.0–100.0)
Platelets: 201 10*3/uL (ref 150–400)
RBC: 4.22 MIL/uL (ref 4.22–5.81)
RDW: 12.8 % (ref 11.5–15.5)
WBC: 7.9 10*3/uL (ref 4.0–10.5)
nRBC: 0 % (ref 0.0–0.2)

## 2022-01-17 LAB — SURGICAL PCR SCREEN
MRSA, PCR: NEGATIVE
Staphylococcus aureus: NEGATIVE

## 2022-01-17 LAB — BASIC METABOLIC PANEL
Anion gap: 9 (ref 5–15)
BUN: 21 mg/dL (ref 8–23)
CO2: 28 mmol/L (ref 22–32)
Calcium: 9.3 mg/dL (ref 8.9–10.3)
Chloride: 105 mmol/L (ref 98–111)
Creatinine, Ser: 1.31 mg/dL — ABNORMAL HIGH (ref 0.61–1.24)
GFR, Estimated: 57 mL/min — ABNORMAL LOW (ref >60)
Glucose, Bld: 98 mg/dL (ref 70–99)
Potassium: 3.2 mmol/L — ABNORMAL LOW (ref 3.5–5.1)
Sodium: 142 mmol/L (ref 135–145)

## 2022-01-17 LAB — TYPE AND SCREEN
ABO/RH(D): O POS
Antibody Screen: NEGATIVE

## 2022-01-18 ENCOUNTER — Telehealth: Payer: Self-pay

## 2022-01-18 NOTE — Progress Notes (Addendum)
Anesthesia Chart Review   Case: [id number] Date/Time: 01/28/22 0700   Procedure: RIGHT TOTAL HIP ARTHROPLASTY POSTERIOR, REMOVAL OF K NAIL (Right: Hip)   Anesthesia type: Spinal   Pre-op diagnosis: RIGHT HIP OSTEOARTHRITIS WITH RETAINED BROKEN K NAIL   Location: [facility] ROOM 07 / [facility]   Surgeons: Frederik Pear, MD       DISCUSSION:76 y.o. former smoker with h/o CHF, sleep apnea, right hip OA with retained hardware scheduled for above procedure 01/28/2022 with Dr. Frederik Pear.   Pt seen by PCP 12/21/21 for preoperative evaluation.  Per OV note, "Pending right anterior hip arthoplasty for 01/28/22 per Dr. Mayer Camel, [place].   Labs reviewed from his visit 2 months ago, all stable. He will have preoperative labs before surgery.   ECG today with sinus bradycardia with rate of 58. No acute ST changes or PAC/PVC Appears similar and improved to prior ECG from 2021. Cleared for surgery from medical and cardiac standpoint."  Pt last seen by cardiology 10/06/2018. At this time it was recommended by Dr. Johnsie Cancel that pt have stress test prior to hip surgery due to atypical chest pain.  Cardiology input requested.   Addendum 01/25/2022:  Pt seen by cardiology 01/23/2022. Per OV note, "Preop cardiovascular assessment dyspnea on exertion: Patient is planning to undergo major surgery and therefore will need evaluation.  I discussed Lexiscan sestamibi testing and is agreeable.  If this is negative then he is not at high risk for coronary events during the aforementioned surgery.  Meticulous hemodynamic monitoring will further reduce the risk of coronary events."  Low risk stress test 01/25/2022.  VS: BP (!) 149/91   Pulse (!) 59   Temp 36.8 C (Oral)   Resp 16   Ht '5\' 10"'$  (1.778 m)   Wt 124.3 kg   SpO2 97%   BMI 39.31 kg/m   PROVIDERS: [name], NP is PCP   Jenkins Rouge, MD is Cardiologist  LABS: Labs reviewed: Acceptable for surgery. (all labs ordered are listed, but only abnormal  results are displayed)  Labs Reviewed  BASIC METABOLIC PANEL - Abnormal; Notable for the following components:      Result Value   Potassium 3.2 (*)    Creatinine, Ser 1.31 (*)    GFR, Estimated 57 (*)    All other components within normal limits  SURGICAL PCR SCREEN  CBC  TYPE AND SCREEN     IMAGES:   EKG: 12/21/21 Rate 58 bpm  Sinus bradycardia  Nonspecific QRS widening  CV: Myocardial Perfusion 01/25/2022   Findings are consistent with no prior ischemia. The study is low risk.   No ST deviation was noted.   Left ventricular function is normal. Nuclear stress EF: 55 %. The left ventricular ejection fraction is normal (55-65%). End diastolic cavity size is mildly enlarged. End systolic cavity size is mildly enlarged.   Prior study available for comparison from 07/19/2014. No changes compared to prior study. Prior study was read as normal with 51% EF.   No reversible ischemia. Mildly dilated LV with normal LVEF of 55%. This is a low risk study. Compared to a prior study in 2015, the LVEF is higher.  Echo 04/26/2013  - Left ventricle: The cavity size was normal. Wall thickness    was increased in a pattern of moderate LVH. Systolic    function was normal. The estimated ejection fraction was    in the range of 50% to 55%. Wall motion was normal; there    were  no regional wall motion abnormalities.  - Mitral valve: Calcified annulus. Mildly thickened leaflets    . Mild regurgitation.  - Left atrium: The atrium was mildly dilated.  Past Medical History:  Diagnosis Date   Arthritis    Basal cell carcinoma 05/13/2019   Drosum nose supratip - ED&C    CHF (congestive heart failure) ([place])    pt denies   Chronic hip pain    Right   COVID-19 virus infection 03/31/2020   Elevated serum creatinine 07/21/2013   Erectile dysfunction    History of kidney stones    Hyperlipidemia    Iritis    Neuropathy    Sleep apnea     Past Surgical History:  Procedure Laterality Date    BACK SURGERY  2000   HERNIA REPAIR     ORIF FEMUR FRACTURE Right 1967   rod insertion   TONSILLECTOMY AND ADENOIDECTOMY      MEDICATIONS:  acetaminophen (TYLENOL) 500 MG tablet   aspirin 81 MG tablet   Aspirin-Caffeine (BC FAST PAIN RELIEF PO)   diclofenac Sodium (VOLTAREN) 1 % GEL   ferrous sulfate 325 (65 FE) MG tablet   fluticasone (FLONASE) 50 MCG/ACT nasal spray   furosemide (LASIX) 20 MG tablet   gabapentin (NEURONTIN) 300 MG capsule   ibuprofen (ADVIL) 200 MG tablet   sildenafil (VIAGRA) 100 MG tablet   tamsulosin (FLOMAX) 0.4 MG CAPS capsule   traMADol (ULTRAM) 50 MG tablet   No current facility-administered medications for this encounter.     Konrad Felix Ward, PA-C [facility] [phone]

## 2022-01-18 NOTE — Telephone Encounter (Signed)
Pt has appt 01/23/22 with Dr. Geraldo Pitter for pre op clearance. Will send FYI to requesting office the pt has appt

## 2022-01-18 NOTE — Telephone Encounter (Signed)
   Pre-operative Risk Assessment    Patient Name: Timothy Sheppard  DOB: 1946/06/13 MRN: [id number]      Request for Surgical Clearance    Procedure:   Right Hip Replacement  Date of Surgery:  Clearance 01/28/22                                 Surgeon:  Dr. Frederik Pear Surgeon's Group or Practice Name:  [facility] Phone number:  [phone] Fax number:  [phone]   Type of Clearance Requested:   - Medical    Type of Anesthesia:  Spinal   Additional requests/questions:  Please advise surgeon/provider what medications should be held.  Signed, Belisicia T Harris   01/18/2022, 3:06 PM  Pt scheduled for a NP appt for clearance on 01/23/22

## 2022-01-18 NOTE — Telephone Encounter (Signed)
   Name: Timothy Sheppard  DOB: 1946/04/22  MRN: [id number]  Primary Cardiologist: None  Chart reviewed as part of pre-operative protocol coverage. Because of KARRINGTON STUDNICKA past medical history and time since last visit, he will require a follow-up in-office visit in order to better assess preoperative cardiovascular risk.  Pre-op covering staff: - Please schedule appointment and call patient to inform them. If patient already had an upcoming appointment within acceptable timeframe, please add "pre-op clearance" to the appointment notes so provider is aware. - Please contact requesting surgeon's office via preferred method (i.e, phone, fax) to inform them of need for appointment prior to surgery.  No pharmacologic clearance has been requested.  Elgie Collard, PA-C  01/18/2022, 3:27 PM

## 2022-01-23 ENCOUNTER — Ambulatory Visit: Payer: Medicare Other | Admitting: Cardiology

## 2022-01-23 ENCOUNTER — Other Ambulatory Visit: Payer: Self-pay

## 2022-01-23 VITALS — BP 172/86 | HR 66 | Ht 71.0 in | Wt 274.4 lb

## 2022-01-23 DIAGNOSIS — G4730 Sleep apnea, unspecified: Secondary | ICD-10-CM | POA: Insufficient documentation

## 2022-01-23 DIAGNOSIS — R0609 Other forms of dyspnea: Secondary | ICD-10-CM

## 2022-01-23 DIAGNOSIS — E669 Obesity, unspecified: Secondary | ICD-10-CM

## 2022-01-23 DIAGNOSIS — I509 Heart failure, unspecified: Secondary | ICD-10-CM | POA: Diagnosis not present

## 2022-01-23 DIAGNOSIS — Z0001 Encounter for general adult medical examination with abnormal findings: Secondary | ICD-10-CM | POA: Insufficient documentation

## 2022-01-23 DIAGNOSIS — Z01810 Encounter for preprocedural cardiovascular examination: Secondary | ICD-10-CM | POA: Insufficient documentation

## 2022-01-23 DIAGNOSIS — R011 Cardiac murmur, unspecified: Secondary | ICD-10-CM | POA: Diagnosis not present

## 2022-01-23 DIAGNOSIS — E785 Hyperlipidemia, unspecified: Secondary | ICD-10-CM | POA: Insufficient documentation

## 2022-01-23 DIAGNOSIS — G8929 Other chronic pain: Secondary | ICD-10-CM | POA: Insufficient documentation

## 2022-01-23 DIAGNOSIS — H209 Unspecified iridocyclitis: Secondary | ICD-10-CM | POA: Insufficient documentation

## 2022-01-23 HISTORY — DX: Other forms of dyspnea: R06.09

## 2022-01-23 NOTE — Patient Instructions (Signed)
Medication Instructions:  Your physician recommends that you continue on your current medications as directed. Please refer to the Current Medication list given to you today.  *If you need a refill on your cardiac medications before your next appointment, please call your pharmacy*   Lab Work: None ordered If you have labs (blood work) drawn today and your tests are completely normal, you will receive your results only by: [place] (if you have MyChart) OR A paper copy in the mail If you have any lab test that is abnormal or we need to change your treatment, we will call you to review the results.   Testing/Procedures: Your physician has requested that you have a lexiscan myoview. For further information please visit [email or web address]. Please follow instruction sheet, as given.  The test will take approximately 3 to 4 hours to complete; you may bring reading material.  If someone comes with you to your appointment, they will need to remain in the main lobby due to limited space in the testing area.   How to prepare for your Myocardial Perfusion Test: Do not eat or drink 3 hours prior to your test, except you may have water. Do not consume products containing caffeine (regular or decaffeinated) 12 hours prior to your test. (ex: coffee, chocolate, sodas, tea). Do bring a list of your current medications with you.  If not listed below, you may take your medications as normal. Do wear comfortable clothes (no dresses or overalls) and walking shoes, tennis shoes preferred (No heels or open toe shoes are allowed). Do NOT wear cologne, perfume, aftershave, or lotions (deodorant is allowed). If these instructions are not followed, your test will have to be rescheduled.  Your physician has requested that you have an echocardiogram. Echocardiography is a painless test that uses sound waves to create images of your heart. It provides your doctor with information about the size and shape of  your heart and how well your heart's chambers and valves are working. This procedure takes approximately one hour. There are no restrictions for this procedure.   Follow-Up: At [facility], you and your health needs are our priority.  As part of our continuing mission to provide you with exceptional heart care, we have created designated Provider Care Teams.  These Care Teams include your primary Cardiologist (physician) and Advanced Practice Providers (APPs -  Physician Assistants and Nurse Practitioners) who all work together to provide you with the care you need, when you need it.  We recommend signing up for the patient portal called "MyChart".  Sign up information is provided on this After Visit Summary.  MyChart is used to connect with patients for Virtual Visits (Telemedicine).  Patients are able to view lab/test results, encounter notes, upcoming appointments, etc.  Non-urgent messages can be sent to your provider as well.   To learn more about what you can do with MyChart, go to [email or web address].    Your next appointment:   As needed   The format for your next appointment:   In Person  Provider:   Jyl Heinz, MD   Other Instructions Cardiac Nuclear Scan A cardiac nuclear scan is a test that is done to check the flow of blood to your heart. It is done when you are resting and when you are exercising. The test looks for problems such as: Not enough blood reaching a portion of the heart. The heart muscle not working as it should. You may need this test if: You have heart disease. You  have had lab results that are not normal. You have had heart surgery or a balloon procedure to open up blocked arteries (angioplasty). You have chest pain. You have shortness of breath. In this test, a special dye (tracer) is put into your bloodstream. The tracer will travel to your heart. A camera will then take pictures of your heart to see how the tracer moves through your heart. This  test is usually done at a hospital and takes 2-4 hours. Tell a doctor about: Any allergies you have. All medicines you are taking, including vitamins, herbs, eye drops, creams, and over-the-counter medicines. Any problems you or family members have had with anesthetic medicines. Any blood disorders you have. Any surgeries you have had. Any medical conditions you have. Whether you are pregnant or may be pregnant. What are the risks? Generally, this is a safe test. However, problems may occur, such as: Serious chest pain and heart attack. This is only a risk if the stress portion of the test is done. Rapid heartbeat. A feeling of warmth in your chest. This feeling usually does not last long. Allergic reaction to the tracer. What happens before the test? Ask your doctor about changing or stopping your normal medicines. This is important. Follow instructions from your doctor about what you cannot eat or drink. Remove your jewelry on the day of the test. What happens during the test? An IV tube will be inserted into one of your veins. Your doctor will give you a small amount of tracer through the IV tube. You will wait for 20-40 minutes while the tracer moves through your bloodstream. Your heart will be monitored with an electrocardiogram (ECG). You will lie down on an exam table. Pictures of your heart will be taken for about 15-20 minutes. You may also have a stress test. For this test, one of these things may be done: You will be asked to exercise on a treadmill or a stationary bike. You will be given medicines that will make your heart work harder. This is done if you are unable to exercise. When blood flow to your heart has peaked, a tracer will again be given through the IV tube. After 20-40 minutes, you will get back on the exam table. More pictures will be taken of your heart. Depending on the tracer that is used, more pictures may need to be taken 3-4 hours later. Your IV tube  will be removed when the test is over. The test may vary among doctors and hospitals. What happens after the test? Ask your doctor: Whether you can return to your normal schedule, including diet, activities, and medicines. Whether you should drink more fluids. This will help to remove the tracer from your body. Drink enough fluid to keep your pee (urine) pale yellow. Ask your doctor, or the department that is doing the test: When will my results be ready? How will I get my results? Summary A cardiac nuclear scan is a test that is done to check the flow of blood to your heart. Tell your doctor whether you are pregnant or may be pregnant. Before the test, ask your doctor about changing or stopping your normal medicines. This is important. Ask your doctor whether you can return to your normal activities. You may be asked to drink more fluids. This information is not intended to replace advice given to you by your health care provider. Make sure you discuss any questions you have with your health care provider. Document Revised: 10/28/2018 Document Reviewed:  12/22/2017 Elsevier Patient Education  2021 [employer].    Echocardiogram An echocardiogram is a test that uses sound waves (ultrasound) to produce images of the heart. Images from an echocardiogram can provide important information about: Heart size and shape. The size and thickness and movement of your heart's walls. Heart muscle function and strength. Heart valve function or if you have stenosis. Stenosis is when the heart valves are too narrow. If blood is flowing backward through the heart valves (regurgitation). A tumor or infectious growth around the heart valves. Areas of heart muscle that are not working well because of poor blood flow or injury from a heart attack. Aneurysm detection. An aneurysm is a weak or damaged part of an artery wall. The wall bulges out from the normal force of blood pumping through the body. Tell a  health care provider about: Any allergies you have. All medicines you are taking, including vitamins, herbs, eye drops, creams, and over-the-counter medicines. Any blood disorders you have. Any surgeries you have had. Any medical conditions you have. Whether you are pregnant or may be pregnant. What are the risks? Generally, this is a safe test. However, problems may occur, including an allergic reaction to dye (contrast) that may be used during the test. What happens before the test? No specific preparation is needed. You may eat and drink normally. What happens during the test? You will take off your clothes from the waist up and put on a hospital gown. Electrodes or electrocardiogram (ECG)patches may be placed on your chest. The electrodes or patches are then connected to a device that monitors your heart rate and rhythm. You will lie down on a table for an ultrasound exam. A gel will be applied to your chest to help sound waves pass through your skin. A handheld device, called a transducer, will be pressed against your chest and moved over your heart. The transducer produces sound waves that travel to your heart and bounce back (or "echo" back) to the transducer. These sound waves will be captured in real-time and changed into images of your heart that can be viewed on a video monitor. The images will be recorded on a computer and reviewed by your health care provider. You may be asked to change positions or hold your breath for a short time. This makes it easier to get different views or better views of your heart. In some cases, you may receive contrast through an IV in one of your veins. This can improve the quality of the pictures from your heart. The procedure may vary among health care providers and hospitals.    What can I expect after the test? You may return to your normal, everyday life, including diet, activities, and medicines, unless your health care provider tells you not to do  that. Follow these instructions at home: It is up to you to get the results of your test. Ask your health care provider, or the department that is doing the test, when your results will be ready. Keep all follow-up visits. This is important. Summary An echocardiogram is a test that uses sound waves (ultrasound) to produce images of the heart. Images from an echocardiogram can provide important information about the size and shape of your heart, heart muscle function, heart valve function, and other possible heart problems. You do not need to do anything to prepare before this test. You may eat and drink normally. After the echocardiogram is completed, you may return to your normal, everyday life, unless  your health care provider tells you not to do that. This information is not intended to replace advice given to you by your health care provider. Make sure you discuss any questions you have with your health care provider. Document Revised: 02/29/2020 Document Reviewed: 02/29/2020 Elsevier Patient Education  2021 [employer].

## 2022-01-23 NOTE — Progress Notes (Addendum)
Cardiology Office Note:    Date:  01/23/2022   ID:  Timothy Sheppard, DOB [birth year], MRN [id number]  PCP:  [name], NP  Cardiologist:  Jenean Lindau, MD   Referring MD: [name], NP    ASSESSMENT:    1. Preop cardiovascular exam   2. Congestive heart failure, unspecified HF chronicity, unspecified heart failure type ([place])   3. Preoperative cardiovascular examination   4. Obesity (BMI 35.0-39.9 without comorbidity)   5. Cardiac murmur   6. DOE (dyspnea on exertion)    PLAN:    In order of problems listed above:  Primary prevention stressed with the patient.  Importance of compliance with diet medication stressed any vocalized understanding. Preop cardiovascular assessment dyspnea on exertion: Patient is planning to undergo major surgery and therefore will need evaluation.  I discussed Lexiscan sestamibi testing and is agreeable.  If this is negative then he is not at high risk for coronary events during the aforementioned surgery.  Meticulous hemodynamic monitoring will further reduce the risk of coronary events. Cardiac murmur: Echocardiogram will be done to assess murmur heard on auscultation. Obesity: Weight reduction stressed and diet emphasized.  He promises to do better.  Lifestyle modification urged. Elevated blood pressure without diagnosis of hypertension: We will keep a track of his blood pressures at home and will get them for [place] for review.  They need to be optimized before he goes for surgery. He will be seen in follow-up appointment on a as needed basis only.   Addendum:  Stress test reports are negative for ischemia so kindly follow recommendations made above Signed Dr. Sunny Schlein Leia Coletti 01/25/2022 at 2:22 PM   Medication Adjustments/Labs and Tests Ordered: Current medicines are reviewed at length with the patient today.  Concerns regarding medicines are outlined above.  Orders Placed This Encounter  Procedures   MYOCARDIAL PERFUSION IMAGING    EKG 12-Lead   ECHOCARDIOGRAM COMPLETE   No orders of the defined types were placed in this encounter.    History of Present Illness:    Timothy Sheppard is a 76 y.o. male who is being seen today for the evaluation of preop cardiovascular assessment at the request of [name], NP.  Patient is a pleasant 76 year old male.  He has past medical history of obesity.  He mentions to me that he has had hip surgery when he was a teenager and now a rod needs to be explanted.  Therefore he is here for preop assessment.  He denies any history of hypertension dyslipidemia or diabetes mellitus.  Overall because of his orthopedic issues he leads a sedentary lifestyle.  He has some dyspnea on exertion.  At the time of my evaluation, the patient is alert awake oriented and in no distress.  Past Medical History:  Diagnosis Date   Acute pain of left shoulder 08/29/2020   Allergic rhinitis 08/25/2015   Arthritis    Basal cell carcinoma 05/13/2019   Drosum nose supratip - ED&C    BPH (benign prostatic hyperplasia) 07/19/2013   Mildly uniformly enlarged on exam 07/19/13  Lab Results  Component Value Date   PSA 0.30 07/19/2013      Chest pain 04/25/2013   CHF (congestive heart failure) ([place])    pt denies   Chronic hip pain    Right   Chronic pain of right lower extremity 11/27/2017   Contact dermatitis due to plant 01/23/2018   Erectile dysfunction    Fatigue 09/04/2018   History of  kidney stones    HTN (hypertension)    Hyperlipidemia    Hypertension    Iritis    Iron deficiency anemia 09/28/2021   Neuropathy    Palpitations 05/03/2013   Poison ivy dermatitis 12/30/2019   Prediabetes 06/11/2018   Preoperative clearance 12/21/2021   Preventative health care 06/11/2018   Sleep apnea     Past Surgical History:  Procedure Laterality Date   BACK SURGERY  2000   HERNIA REPAIR     ORIF FEMUR FRACTURE Right 1967   rod insertion   TONSILLECTOMY AND ADENOIDECTOMY      Current  Medications: Current Meds  Medication Sig   acetaminophen (TYLENOL) 500 MG tablet Take 1,000 mg by mouth every 6 (six) hours as needed for moderate pain.   aspirin 81 MG tablet Take 81 mg by mouth once a week.   Aspirin-Caffeine (BC FAST PAIN RELIEF PO) Take 1-2 packets by mouth daily as needed (pain).   diclofenac Sodium (VOLTAREN) 1 % GEL Apply 2 g topically 3 (three) times daily as needed. For pain.   ferrous sulfate 325 (65 FE) MG tablet Take 650 mg by mouth daily with breakfast.   fluticasone (FLONASE) 50 MCG/ACT nasal spray Place 1 spray into both nostrils daily as needed for allergies or rhinitis.   furosemide (LASIX) 20 MG tablet TAKE 2 TABLETS (40 MG TOTAL) BY MOUTH DAILY. FOR LEG SWELLING.   gabapentin (NEURONTIN) 300 MG capsule TAKE 1 CAPSULE IN THE MORNING AND 3 CAPS AT BEDTIME FOR PAIN   ibuprofen (ADVIL) 200 MG tablet Take 800 mg by mouth every 8 (eight) hours as needed for moderate pain.   sildenafil (VIAGRA) 100 MG tablet TAKE 1 TABLET BY MOUTH 30 MINUTES PRIOR TO INTERCOURSE AS NEEDED   tamsulosin (FLOMAX) 0.4 MG CAPS capsule TAKE 1 CAPSULE BY MOUTH IN THE MORNING FOR  URINE  STREAM   traMADol (ULTRAM) 50 MG tablet Take 50 mg by mouth every 6 (six) hours as needed for pain.     Allergies:   Patient has no known allergies.   Social History   Socioeconomic History   Marital status: Married    Spouse name: Not on file   Number of children: Not on file   Years of education: Not on file   Highest education level: Not on file  Occupational History   Not on file  Tobacco Use   Smoking status: Former    Types: Pipe   Smokeless tobacco: Former   Tobacco comments:    Quit 1 mo ago  Scientific laboratory technician Use: Never used  Substance and Sexual Activity   Alcohol use: No   Drug use: No   Sexual activity: Not on file  Other Topics Concern   Not on file  Social History Narrative   ** Merged History Encounter **       Married. 2 children, 2 grandchildren, 2 great  grandchilden. Retired.     Social Determinants of Health   Financial Resource Strain: Low Risk  (12/06/2021)   Overall Financial Resource Strain (CARDIA)    Difficulty of Paying Living Expenses: Not hard at all  Food Insecurity: No Food Insecurity (12/06/2021)   Hunger Vital Sign    Worried About Running Out of Food in the Last Year: Never true    Ran Out of Food in the Last Year: Never true  Transportation Needs: No Transportation Needs (12/06/2021)   PRAPARE - Hydrologist (Medical): No  Lack of Transportation (Non-Medical): No  Physical Activity: Insufficiently Active (12/06/2021)   Exercise Vital Sign    Days of Exercise per Week: 3 days    Minutes of Exercise per Session: 30 min  Stress: No Stress Concern Present (12/06/2021)   [place]    Feeling of Stress : Not at all  Social Connections: [place] (12/06/2021)   Social Connection and Isolation Panel [NHANES]    Frequency of Communication with Friends and Family: More than three times a week    Frequency of Social Gatherings with Friends and Family: More than three times a week    Attends Religious Services: More than 4 times per year    Active Member of [employer] or Organizations: Yes    Attends Music therapist: More than 4 times per year    Marital Status: Married     Family History: The patient's family history includes Heart failure in his father and mother; Lupus in his mother.  ROS:   Please see the history of present illness.    All other systems reviewed and are negative.  EKGs/Labs/Other Studies Reviewed:    The following studies were reviewed today: I discussed my findings with the patient at length.  EKG reveals sinus rhythm and nonspecific ST-T changes   Recent Labs: 09/28/2021: ALT 11 01/17/2022: BUN 21; Creatinine, Ser 1.31; Hemoglobin 13.5; Platelets 201; Potassium 3.2; Sodium 142   Recent Lipid Panel    Component Value Date/Time   CHOL 159 09/28/2021 1053   CHOL 131 04/07/2017 0826   TRIG 91.0 09/28/2021 1053   HDL 45.00 09/28/2021 1053   HDL 59 04/07/2017 0826   CHOLHDL 4 09/28/2021 1053   VLDL 18.2 09/28/2021 1053   LDLCALC 96 09/28/2021 1053   LDLCALC 61 04/07/2017 0826    Physical Exam:    VS:  BP (!) 172/86   Pulse 66   Ht '5\' 11"'$  (1.803 m)   Wt 274 lb 6.4 oz (124.5 kg)   SpO2 96%   BMI 38.27 kg/m     Wt Readings from Last 3 Encounters:  01/23/22 274 lb 6.4 oz (124.5 kg)  01/17/22 274 lb (124.3 kg)  12/21/21 270 lb (122.5 kg)     GEN: Patient is in no acute distress HEENT: Normal NECK: No JVD; No carotid bruits LYMPHATICS: No lymphadenopathy CARDIAC: S1 S2 regular, 2/6 systolic murmur at the apex. RESPIRATORY:  Clear to auscultation without rales, wheezing or rhonchi  ABDOMEN: Soft, non-tender, non-distended MUSCULOSKELETAL:  No edema; No deformity  SKIN: Warm and dry NEUROLOGIC:  Alert and oriented x 3 PSYCHIATRIC:  Normal affect    Signed, Jenean Lindau, MD  01/23/2022 2:07 PM    [place]

## 2022-01-25 ENCOUNTER — Telehealth: Payer: Self-pay

## 2022-01-25 ENCOUNTER — Ambulatory Visit (HOSPITAL_BASED_OUTPATIENT_CLINIC_OR_DEPARTMENT_OTHER)
Admission: RE | Admit: 2022-01-25 | Discharge: 2022-01-25 | Disposition: A | Discharge status: Home / Self Care | Payer: Medicare Other | Source: Ambulatory Visit | Attending: Cardiovascular Disease | Admitting: Cardiovascular Disease

## 2022-01-25 DIAGNOSIS — Y831 Surgical operation with implant of artificial internal device as the cause of abnormal reaction of the patient, or of later complication, without mention of misadventure at the time of the procedure: Secondary | ICD-10-CM | POA: Diagnosis present

## 2022-01-25 DIAGNOSIS — N400 Enlarged prostate without lower urinary tract symptoms: Secondary | ICD-10-CM | POA: Diagnosis present

## 2022-01-25 DIAGNOSIS — I110 Hypertensive heart disease with heart failure: Secondary | ICD-10-CM | POA: Diagnosis not present

## 2022-01-25 DIAGNOSIS — Z6838 Body mass index (BMI) 38.0-38.9, adult: Secondary | ICD-10-CM | POA: Diagnosis not present

## 2022-01-25 DIAGNOSIS — E669 Obesity, unspecified: Secondary | ICD-10-CM | POA: Diagnosis present

## 2022-01-25 DIAGNOSIS — D62 Acute posthemorrhagic anemia: Secondary | ICD-10-CM | POA: Diagnosis not present

## 2022-01-25 DIAGNOSIS — Z7982 Long term (current) use of aspirin: Secondary | ICD-10-CM | POA: Diagnosis not present

## 2022-01-25 DIAGNOSIS — Z01810 Encounter for preprocedural cardiovascular examination: Secondary | ICD-10-CM

## 2022-01-25 DIAGNOSIS — M1611 Unilateral primary osteoarthritis, right hip: Secondary | ICD-10-CM | POA: Diagnosis not present

## 2022-01-25 DIAGNOSIS — G4733 Obstructive sleep apnea (adult) (pediatric): Secondary | ICD-10-CM | POA: Diagnosis not present

## 2022-01-25 DIAGNOSIS — Z96641 Presence of right artificial hip joint: Secondary | ICD-10-CM | POA: Diagnosis not present

## 2022-01-25 DIAGNOSIS — T84010A Broken internal right hip prosthesis, initial encounter: Secondary | ICD-10-CM | POA: Diagnosis not present

## 2022-01-25 DIAGNOSIS — E785 Hyperlipidemia, unspecified: Secondary | ICD-10-CM | POA: Diagnosis not present

## 2022-01-25 DIAGNOSIS — Z85828 Personal history of other malignant neoplasm of skin: Secondary | ICD-10-CM | POA: Diagnosis not present

## 2022-01-25 DIAGNOSIS — G8929 Other chronic pain: Secondary | ICD-10-CM | POA: Diagnosis not present

## 2022-01-25 DIAGNOSIS — T84114A Breakdown (mechanical) of internal fixation device of right femur, initial encounter: Secondary | ICD-10-CM | POA: Diagnosis not present

## 2022-01-25 DIAGNOSIS — Z79899 Other long term (current) drug therapy: Secondary | ICD-10-CM | POA: Diagnosis not present

## 2022-01-25 DIAGNOSIS — Z8249 Family history of ischemic heart disease and other diseases of the circulatory system: Secondary | ICD-10-CM | POA: Diagnosis not present

## 2022-01-25 DIAGNOSIS — G629 Polyneuropathy, unspecified: Secondary | ICD-10-CM | POA: Diagnosis not present

## 2022-01-25 DIAGNOSIS — T84418A Breakdown (mechanical) of other internal orthopedic devices, implants and grafts, initial encounter: Secondary | ICD-10-CM | POA: Diagnosis not present

## 2022-01-25 DIAGNOSIS — I509 Heart failure, unspecified: Secondary | ICD-10-CM | POA: Diagnosis not present

## 2022-01-25 DIAGNOSIS — Z87891 Personal history of nicotine dependence: Secondary | ICD-10-CM | POA: Diagnosis not present

## 2022-01-25 DIAGNOSIS — I10 Essential (primary) hypertension: Secondary | ICD-10-CM | POA: Diagnosis not present

## 2022-01-25 DIAGNOSIS — R7303 Prediabetes: Secondary | ICD-10-CM | POA: Diagnosis not present

## 2022-01-25 DIAGNOSIS — Z471 Aftercare following joint replacement surgery: Secondary | ICD-10-CM | POA: Diagnosis not present

## 2022-01-25 LAB — MYOCARDIAL PERFUSION IMAGING
LV dias vol: 172 mL (ref 62–150)
LV sys vol: 77 mL
Nuc Stress EF: 55 %
Peak HR: 93 {beats}/min
Rest HR: 51 {beats}/min
Rest Nuclear Isotope Dose: 10.1 mCi
SDS: 2
SRS: 4
SSS: 6
ST Depression (mm): 0 mm
Stress Nuclear Isotope Dose: 30.1 mCi
TID: 0.98

## 2022-01-25 MED ORDER — TECHNETIUM TC 99M TETROFOSMIN IV KIT
30.1000 | PACK | Freq: Once | INTRAVENOUS | Status: AC | PRN
  Administered 2022-01-25: 30.1 via INTRAVENOUS

## 2022-01-25 MED ORDER — REGADENOSON 0.4 MG/5ML IV SOLN
0.4000 mg | Freq: Once | INTRAVENOUS | Status: AC
  Administered 2022-01-25: 0.4 mg via INTRAVENOUS

## 2022-01-25 MED ORDER — TECHNETIUM TC 99M TETROFOSMIN IV KIT
10.1000 | PACK | Freq: Once | INTRAVENOUS | Status: AC | PRN
  Administered 2022-01-25: 10.1 via INTRAVENOUS

## 2022-01-25 NOTE — Telephone Encounter (Signed)
Patient calling back to make sure he is cleared for surgery. Please advise

## 2022-01-25 NOTE — Telephone Encounter (Signed)
Stress test has been completed but not read as it was done in [place]. Pt is scheduled for surgery on Monday 01/28/22.

## 2022-01-25 NOTE — Anesthesia Preprocedure Evaluation (Addendum)
Anesthesia Evaluation  Patient identified by MRN, date of birth, ID band Patient awake    Reviewed: Allergy & Precautions, NPO status , Patient's Chart, lab work & pertinent test results  Airway Mallampati: II  TM Distance: >3 FB     Dental   Pulmonary sleep apnea , former smoker,    breath sounds clear to auscultation       Cardiovascular hypertension, +CHF and + DOE   Rhythm:Regular Rate:Normal     Neuro/Psych    GI/Hepatic negative GI ROS, Neg liver ROS,   Endo/Other  negative endocrine ROS  Renal/GU negative Renal ROS     Musculoskeletal   Abdominal   Peds  Hematology   Anesthesia Other Findings   Reproductive/Obstetrics                            Anesthesia Physical Anesthesia Plan  ASA: 3  Anesthesia Plan: Spinal   Post-op Pain Management:    Induction: Intravenous  PONV Risk Score and Plan: Ondansetron, Dexamethasone and Propofol infusion  Airway Management Planned: Simple Face Mask and Nasal Cannula  Additional Equipment:   Intra-op Plan:   Post-operative Plan:   Informed Consent: I have reviewed the patients History and Physical, chart, labs and discussed the procedure including the risks, benefits and alternatives for the proposed anesthesia with the patient or authorized representative who has indicated his/her understanding and acceptance.     Dental advisory given  Plan Discussed with: CRNA and Anesthesiologist  Anesthesia Plan Comments: (See PAT note 01/17/2022)      Anesthesia Quick Evaluation

## 2022-01-25 NOTE — H&P (Signed)
TOTAL HIP ADMISSION H&P  Patient is admitted for right total hip arthroplasty.  Subjective:  Chief Complaint: right hip pain  HPI: Timothy Sheppard, 76 y.o. male, has a history of pain and functional disability in the right hip(s) due to trauma and arthritis and patient has failed non-surgical conservative treatments for greater than 12 weeks to include NSAID's and/or analgesics, corticosteriod injections, flexibility and strengthening excercises, supervised PT with diminished ADL's post treatment, use of assistive devices, weight reduction as appropriate, and activity modification.  Onset of symptoms was gradual starting  several  years ago with gradually worsening course since that time.The patient noted prior procedures of the hip to include fracture with k nail  on the right hip(s).  Patient currently rates pain in the right hip at 10 out of 10 with activity. Patient has night pain, worsening of pain with activity and weight bearing, trendelenberg gait, pain that interfers with activities of daily living, pain with passive range of motion, and crepitus. Patient has evidence of joint space narrowing and retained hardware  by imaging studies. This condition presents safety issues increasing the risk of falls. This patient has had proximal femur fracture.  There is no current active infection.  Patient Active Problem List   Diagnosis Date Noted   Osteoarthritis of right hip 01/25/2022   Chronic hip pain 01/23/2022   Hyperlipidemia 01/23/2022   Iritis 01/23/2022   Sleep apnea 01/23/2022   Preoperative cardiovascular examination 01/23/2022   Obesity (BMI 35.0-39.9 without comorbidity) 01/23/2022   Cardiac murmur 01/23/2022   DOE (dyspnea on exertion) 01/23/2022   Preoperative clearance 12/21/2021   Iron deficiency anemia 09/28/2021   Acute pain of left shoulder 08/29/2020   Poison ivy dermatitis 12/30/2019   Basal cell carcinoma 05/13/2019   Fatigue 09/04/2018   Prediabetes 06/11/2018    Arthritis 06/11/2018   Preventative health care 06/11/2018   Contact dermatitis due to plant 01/23/2018   Neuropathy 01/09/2018   Erectile dysfunction 01/09/2018   Chronic pain of right lower extremity 11/27/2017   CHF (congestive heart failure) ([place])    Allergic rhinitis 08/25/2015   History of kidney stones 07/21/2013   BPH (benign prostatic hyperplasia) 07/19/2013   Palpitations 05/03/2013   Chest pain 04/25/2013   HTN (hypertension) 04/25/2013   Past Medical History:  Diagnosis Date   Acute pain of left shoulder 08/29/2020   Allergic rhinitis 08/25/2015   Arthritis    Basal cell carcinoma 05/13/2019   Drosum nose supratip - ED&C    BPH (benign prostatic hyperplasia) 07/19/2013   Mildly uniformly enlarged on exam 07/19/13  Lab Results  Component Value Date   PSA 0.30 07/19/2013      Chest pain 04/25/2013   CHF (congestive heart failure) ([place])    pt denies   Chronic hip pain    Right   Chronic pain of right lower extremity 11/27/2017   Contact dermatitis due to plant 01/23/2018   Erectile dysfunction    Fatigue 09/04/2018   History of kidney stones    HTN (hypertension)    Hyperlipidemia    Hypertension    Iritis    Iron deficiency anemia 09/28/2021   Neuropathy    Palpitations 05/03/2013   Poison ivy dermatitis 12/30/2019   Prediabetes 06/11/2018   Preoperative clearance 12/21/2021   Preventative health care 06/11/2018   Sleep apnea     Past Surgical History:  Procedure Laterality Date   BACK SURGERY  2000   HERNIA REPAIR     ORIF FEMUR FRACTURE Right  1967   rod insertion   TONSILLECTOMY AND ADENOIDECTOMY      No current facility-administered medications for this encounter.   Current Outpatient Medications  Medication Sig Dispense Refill Last Dose   acetaminophen (TYLENOL) 500 MG tablet Take 1,000 mg by mouth every 6 (six) hours as needed for moderate pain.      aspirin 81 MG tablet Take 81 mg by mouth once a week.      Aspirin-Caffeine (BC FAST PAIN RELIEF PO) Take  1-2 packets by mouth daily as needed (pain).      ferrous sulfate 325 (65 FE) MG tablet Take 650 mg by mouth daily with breakfast.      fluticasone (FLONASE) 50 MCG/ACT nasal spray Place 1 spray into both nostrils daily as needed for allergies or rhinitis.      furosemide (LASIX) 20 MG tablet TAKE 2 TABLETS (40 MG TOTAL) BY MOUTH DAILY. FOR LEG SWELLING. 180 tablet 3    gabapentin (NEURONTIN) 300 MG capsule TAKE 1 CAPSULE IN THE MORNING AND 3 CAPS AT BEDTIME FOR PAIN 360 capsule 3    ibuprofen (ADVIL) 200 MG tablet Take 800 mg by mouth every 8 (eight) hours as needed for moderate pain.      sildenafil (VIAGRA) 100 MG tablet TAKE 1 TABLET BY MOUTH 30 MINUTES PRIOR TO INTERCOURSE AS NEEDED 30 tablet 0    tamsulosin (FLOMAX) 0.4 MG CAPS capsule TAKE 1 CAPSULE BY MOUTH IN THE MORNING FOR  URINE  STREAM 90 capsule 3    traMADol (ULTRAM) 50 MG tablet Take 50 mg by mouth every 6 (six) hours as needed for pain.      diclofenac Sodium (VOLTAREN) 1 % GEL Apply 2 g topically 3 (three) times daily as needed. For pain. 100 g 0 Not Taking   No Known Allergies  Social History   Tobacco Use   Smoking status: Former    Types: Pipe   Smokeless tobacco: Former   Tobacco comments:    Quit 1 mo ago  Substance Use Topics   Alcohol use: No    Family History  Problem Relation Age of Onset   Heart failure Mother    Lupus Mother    Heart failure Father      Review of Systems  Constitutional: Negative.   HENT: Negative.    Eyes: Negative.   Respiratory: Negative.    Cardiovascular: Negative.   Gastrointestinal: Negative.   Endocrine: Negative.   Genitourinary:  Positive for difficulty urinating.  Musculoskeletal:  Positive for arthralgias and myalgias.  Allergic/Immunologic: Negative.   Neurological:  Positive for dizziness.  Hematological: Negative.   Psychiatric/Behavioral: Negative.      Objective:  Physical Exam Constitutional:      Appearance: Normal appearance. He is normal weight.   HENT:     Head: Normocephalic and atraumatic.     Nose: Nose normal.     Mouth/Throat:     Mouth: Mucous membranes are moist.     Pharynx: Oropharynx is clear.  Eyes:     Pupils: Pupils are equal, round, and reactive to light.  Cardiovascular:     Pulses: Normal pulses.  Pulmonary:     Effort: Pulmonary effort is normal.  Musculoskeletal:        General: Tenderness present.     Cervical back: Normal range of motion and neck supple.     Comments: There is no swelling or edema to the leg no redness or erythema.  Internal and external rotation continues to cause a  clicking sound from the broken rod.  Foot tap is negative.  He can actively flex his hip and seated position but does have some discomfort.  Good power to testing of the abductors.  He is neurovascular intact distally but there is some pitting edema to both lower extremities.  Skin:    General: Skin is warm and dry.  Neurological:     General: No focal deficit present.     Mental Status: He is alert.  Psychiatric:        Mood and Affect: Mood normal.        Behavior: Behavior normal.        Thought Content: Thought content normal.        Judgment: Judgment normal.     Vital signs in last 24 hours: Weight:  [124.3 kg] 124.3 kg (07/07 0826)  Labs:   Estimated body mass index is 38.22 kg/m as calculated from the following:   Height as of 01/25/22: '5\' 11"'$  (1.803 m).   Weight as of 01/25/22: 124.3 kg.   Imaging Review Plain radiographs demonstrate AP pelvis and crosstable lateral of the hip AP and lateral of the entire femur shows no change from radiographs that were done back in April of this year.  He does not have any fracture of the femoral neck.  The end-stage arthritis of the right hip has progressed he is now bone-on-bone with subchondral cysts and there are some early collapse of the femoral head.  AP and lateral x-rays of the right knee show no evidence of fracture below the tip of the nail, there is mild  osteoarthritis.      Assessment/Plan:  End stage arthritis, right hip(s) with old k-nail  The patient history, physical examination, clinical judgement of the provider and imaging studies are consistent with end stage degenerative joint disease of the right hip(s) and total hip arthroplasty is deemed medically necessary. The treatment options including medical management, injection therapy, arthroscopy and arthroplasty were discussed at length. The risks and benefits of total hip arthroplasty were presented and reviewed. The risks due to aseptic loosening, infection, stiffness, dislocation/subluxation,  thromboembolic complications and other imponderables were discussed.  The patient acknowledged the explanation, agreed to proceed with the plan and consent was signed. Patient is being admitted for inpatient treatment for surgery, pain control, PT, OT, prophylactic antibiotics, VTE prophylaxis, progressive ambulation and ADL's and discharge planning.The patient is planning to be discharged home with home health services   Anticipated LOS equal to or greater than 2 midnights due to - Age 32 and older with one or more of the following:  - Obesity  - Expected need for hospital services (PT, OT, Nursing) required for safe  discharge  - Anticipated need for postoperative skilled nursing care or inpatient rehab

## 2022-01-25 NOTE — Telephone Encounter (Signed)
Error

## 2022-01-27 MED ORDER — TRANEXAMIC ACID 1000 MG/10ML IV SOLN
2000.0000 mg | INTRAVENOUS | Status: DC
  Filled 2022-01-27: qty 20

## 2022-01-28 ENCOUNTER — Inpatient Hospital Stay (HOSPITAL_COMMUNITY): Payer: Medicare Other | Admitting: Physician Assistant

## 2022-01-28 ENCOUNTER — Inpatient Hospital Stay (HOSPITAL_COMMUNITY): Payer: Medicare Other | Admitting: Anesthesiology

## 2022-01-28 ENCOUNTER — Encounter (HOSPITAL_COMMUNITY)
Admission: RE | Disposition: A | Discharge status: Home / Self Care | Payer: Self-pay | Source: Home / Self Care | Attending: Orthopedic Surgery

## 2022-01-28 ENCOUNTER — Other Ambulatory Visit: Payer: Self-pay

## 2022-01-28 ENCOUNTER — Encounter (HOSPITAL_COMMUNITY): Payer: Self-pay | Admitting: Orthopedic Surgery

## 2022-01-28 ENCOUNTER — Inpatient Hospital Stay (HOSPITAL_COMMUNITY)
Admission: RE | Admit: 2022-01-28 | Discharge: 2022-01-30 | DRG: 470 | Disposition: A | Discharge status: Home / Self Care | Payer: Medicare Other | Attending: Orthopedic Surgery | Admitting: Orthopedic Surgery

## 2022-01-28 ENCOUNTER — Observation Stay (HOSPITAL_COMMUNITY): Payer: Medicare Other

## 2022-01-28 DIAGNOSIS — G629 Polyneuropathy, unspecified: Secondary | ICD-10-CM | POA: Diagnosis present

## 2022-01-28 DIAGNOSIS — I110 Hypertensive heart disease with heart failure: Secondary | ICD-10-CM | POA: Diagnosis not present

## 2022-01-28 DIAGNOSIS — I509 Heart failure, unspecified: Secondary | ICD-10-CM | POA: Diagnosis present

## 2022-01-28 DIAGNOSIS — Z87891 Personal history of nicotine dependence: Secondary | ICD-10-CM | POA: Diagnosis not present

## 2022-01-28 DIAGNOSIS — Z85828 Personal history of other malignant neoplasm of skin: Secondary | ICD-10-CM | POA: Diagnosis not present

## 2022-01-28 DIAGNOSIS — Z7982 Long term (current) use of aspirin: Secondary | ICD-10-CM | POA: Diagnosis not present

## 2022-01-28 DIAGNOSIS — Z471 Aftercare following joint replacement surgery: Secondary | ICD-10-CM | POA: Diagnosis not present

## 2022-01-28 DIAGNOSIS — Z79899 Other long term (current) drug therapy: Secondary | ICD-10-CM

## 2022-01-28 DIAGNOSIS — N400 Enlarged prostate without lower urinary tract symptoms: Secondary | ICD-10-CM | POA: Diagnosis present

## 2022-01-28 DIAGNOSIS — E669 Obesity, unspecified: Secondary | ICD-10-CM | POA: Diagnosis present

## 2022-01-28 DIAGNOSIS — T84114A Breakdown (mechanical) of internal fixation device of right femur, initial encounter: Secondary | ICD-10-CM | POA: Diagnosis present

## 2022-01-28 DIAGNOSIS — M1611 Unilateral primary osteoarthritis, right hip: Secondary | ICD-10-CM

## 2022-01-28 DIAGNOSIS — I10 Essential (primary) hypertension: Secondary | ICD-10-CM | POA: Diagnosis present

## 2022-01-28 DIAGNOSIS — T84010A Broken internal right hip prosthesis, initial encounter: Secondary | ICD-10-CM | POA: Diagnosis not present

## 2022-01-28 DIAGNOSIS — Z96641 Presence of right artificial hip joint: Secondary | ICD-10-CM | POA: Diagnosis not present

## 2022-01-28 DIAGNOSIS — Y831 Surgical operation with implant of artificial internal device as the cause of abnormal reaction of the patient, or of later complication, without mention of misadventure at the time of the procedure: Secondary | ICD-10-CM | POA: Diagnosis present

## 2022-01-28 DIAGNOSIS — Z8249 Family history of ischemic heart disease and other diseases of the circulatory system: Secondary | ICD-10-CM | POA: Diagnosis not present

## 2022-01-28 DIAGNOSIS — D509 Iron deficiency anemia, unspecified: Secondary | ICD-10-CM | POA: Diagnosis present

## 2022-01-28 DIAGNOSIS — E785 Hyperlipidemia, unspecified: Secondary | ICD-10-CM | POA: Diagnosis present

## 2022-01-28 DIAGNOSIS — G8929 Other chronic pain: Secondary | ICD-10-CM | POA: Diagnosis present

## 2022-01-28 DIAGNOSIS — G4733 Obstructive sleep apnea (adult) (pediatric): Secondary | ICD-10-CM | POA: Diagnosis present

## 2022-01-28 DIAGNOSIS — Z6838 Body mass index (BMI) 38.0-38.9, adult: Secondary | ICD-10-CM | POA: Diagnosis not present

## 2022-01-28 DIAGNOSIS — D62 Acute posthemorrhagic anemia: Secondary | ICD-10-CM | POA: Diagnosis not present

## 2022-01-28 DIAGNOSIS — R7303 Prediabetes: Secondary | ICD-10-CM | POA: Diagnosis present

## 2022-01-28 DIAGNOSIS — Z01818 Encounter for other preprocedural examination: Secondary | ICD-10-CM

## 2022-01-28 HISTORY — PX: TOTAL HIP ARTHROPLASTY: SHX124

## 2022-01-28 LAB — ABO/RH: ABO/RH(D): O POS

## 2022-01-28 SURGERY — ARTHROPLASTY, HIP, TOTAL,POSTERIOR APPROACH
Anesthesia: Spinal | Site: Hip | Laterality: Right

## 2022-01-28 MED ORDER — POVIDONE-IODINE 10 % EX SWAB
2.0000 | Freq: Once | CUTANEOUS | Status: AC
  Administered 2022-01-28: 2 via TOPICAL

## 2022-01-28 MED ORDER — TAMSULOSIN HCL 0.4 MG PO CAPS
0.4000 mg | ORAL_CAPSULE | Freq: Every day | ORAL | Status: DC
  Administered 2022-01-29 – 2022-01-30 (×2): 0.4 mg via ORAL
  Filled 2022-01-28 (×2): qty 1

## 2022-01-28 MED ORDER — EPHEDRINE SULFATE (PRESSORS) 50 MG/ML IJ SOLN
INTRAMUSCULAR | Status: DC | PRN
  Administered 2022-01-28: 15 mg via INTRAVENOUS
  Administered 2022-01-28: 5 mg via INTRAVENOUS

## 2022-01-28 MED ORDER — GLYCOPYRROLATE 0.2 MG/ML IJ SOLN
INTRAMUSCULAR | Status: AC
  Filled 2022-01-28: qty 1

## 2022-01-28 MED ORDER — ONDANSETRON HCL 4 MG/2ML IJ SOLN
INTRAMUSCULAR | Status: DC | PRN
  Administered 2022-01-28: 4 mg via INTRAVENOUS

## 2022-01-28 MED ORDER — HYDROMORPHONE HCL 2 MG PO TABS
2.0000 mg | ORAL_TABLET | ORAL | Status: DC | PRN
  Administered 2022-01-28 – 2022-01-30 (×4): 2 mg via ORAL
  Filled 2022-01-28 (×4): qty 1

## 2022-01-28 MED ORDER — VASOPRESSIN 20 UNIT/ML IV SOLN
INTRAVENOUS | Status: AC
  Filled 2022-01-28: qty 1

## 2022-01-28 MED ORDER — METHOCARBAMOL 500 MG PO TABS
500.0000 mg | ORAL_TABLET | Freq: Four times a day (QID) | ORAL | Status: DC | PRN
  Administered 2022-01-28 – 2022-01-29 (×3): 500 mg via ORAL
  Filled 2022-01-28 (×3): qty 1

## 2022-01-28 MED ORDER — ONDANSETRON HCL 4 MG PO TABS: 4.0000 mg | ORAL_TABLET | Freq: Four times a day (QID) | ORAL | Status: DC | PRN

## 2022-01-28 MED ORDER — ASPIRIN 81 MG PO TBEC: 81.0000 mg | DELAYED_RELEASE_TABLET | Freq: Two times a day (BID) | ORAL | 0 refills | Status: AC

## 2022-01-28 MED ORDER — TRANEXAMIC ACID 1000 MG/10ML IV SOLN
INTRAVENOUS | Status: DC | PRN
  Administered 2022-01-28: 2000 mg via TOPICAL

## 2022-01-28 MED ORDER — METOCLOPRAMIDE HCL 5 MG PO TABS: 5.0000 mg | ORAL_TABLET | Freq: Three times a day (TID) | ORAL | Status: DC | PRN

## 2022-01-28 MED ORDER — FENTANYL CITRATE (PF) 100 MCG/2ML IJ SOLN
INTRAMUSCULAR | Status: AC
  Filled 2022-01-28: qty 2

## 2022-01-28 MED ORDER — PHENYLEPHRINE HCL-NACL 20-0.9 MG/250ML-% IV SOLN
INTRAVENOUS | Status: AC
  Filled 2022-01-28: qty 250

## 2022-01-28 MED ORDER — DEXAMETHASONE SODIUM PHOSPHATE 10 MG/ML IJ SOLN
INTRAMUSCULAR | Status: DC | PRN
  Administered 2022-01-28: 4 mg via INTRAVENOUS

## 2022-01-28 MED ORDER — BUPIVACAINE LIPOSOME 1.3 % IJ SUSP: 10.0000 mL | Freq: Once | INTRAMUSCULAR | Status: DC

## 2022-01-28 MED ORDER — POLYETHYLENE GLYCOL 3350 17 G PO PACK: 17.0000 g | PACK | Freq: Every day | ORAL | Status: DC | PRN

## 2022-01-28 MED ORDER — ALBUMIN HUMAN 5 % IV SOLN: INTRAVENOUS | Status: DC | PRN

## 2022-01-28 MED ORDER — PROPOFOL 500 MG/50ML IV EMUL
INTRAVENOUS | Status: AC
Start: 2022-01-28 — End: ?
  Filled 2022-01-28: qty 50

## 2022-01-28 MED ORDER — ALBUMIN HUMAN 5 % IV SOLN
INTRAVENOUS | Status: AC
  Filled 2022-01-28: qty 250

## 2022-01-28 MED ORDER — METOCLOPRAMIDE HCL 5 MG/ML IJ SOLN: 5.0000 mg | Freq: Three times a day (TID) | INTRAMUSCULAR | Status: DC | PRN

## 2022-01-28 MED ORDER — HYDROMORPHONE HCL 1 MG/ML IJ SOLN: 0.2500 mg | INTRAMUSCULAR | Status: DC | PRN

## 2022-01-28 MED ORDER — ATROPINE SULFATE 0.4 MG/ML IV SOLN
INTRAVENOUS | Status: AC
  Filled 2022-01-28: qty 1

## 2022-01-28 MED ORDER — BUPIVACAINE LIPOSOME 1.3 % IJ SUSP
INTRAMUSCULAR | Status: AC
  Filled 2022-01-28: qty 20

## 2022-01-28 MED ORDER — 0.9 % SODIUM CHLORIDE (POUR BTL) OPTIME
TOPICAL | Status: DC | PRN
  Administered 2022-01-28: 1000 mL

## 2022-01-28 MED ORDER — DEXAMETHASONE SODIUM PHOSPHATE 10 MG/ML IJ SOLN
10.0000 mg | Freq: Once | INTRAMUSCULAR | Status: AC
  Administered 2022-01-29: 10 mg via INTRAVENOUS
  Filled 2022-01-28: qty 1

## 2022-01-28 MED ORDER — PROPOFOL 500 MG/50ML IV EMUL
INTRAVENOUS | Status: DC | PRN
  Administered 2022-01-28: 50 ug/kg/min via INTRAVENOUS

## 2022-01-28 MED ORDER — MIDAZOLAM HCL 2 MG/2ML IJ SOLN
INTRAMUSCULAR | Status: AC
  Filled 2022-01-28: qty 2

## 2022-01-28 MED ORDER — SODIUM CHLORIDE (PF) 0.9 % IJ SOLN
INTRAMUSCULAR | Status: AC
  Filled 2022-01-28: qty 50

## 2022-01-28 MED ORDER — MENTHOL 3 MG MT LOZG
1.0000 | LOZENGE | OROMUCOSAL | Status: DC | PRN
  Administered 2022-01-29: 3 mg via ORAL
  Filled 2022-01-28: qty 9

## 2022-01-28 MED ORDER — ATROPINE SULFATE 0.4 MG/ML IV SOLN
INTRAVENOUS | Status: DC | PRN
  Administered 2022-01-28: .2 mg via INTRAVENOUS

## 2022-01-28 MED ORDER — LACTATED RINGERS IV SOLN: INTRAVENOUS | Status: DC

## 2022-01-28 MED ORDER — MIDAZOLAM HCL 5 MG/5ML IJ SOLN
INTRAMUSCULAR | Status: DC | PRN
  Administered 2022-01-28 (×2): 1 mg via INTRAVENOUS

## 2022-01-28 MED ORDER — PANTOPRAZOLE SODIUM 40 MG PO TBEC
40.0000 mg | DELAYED_RELEASE_TABLET | Freq: Every day | ORAL | Status: DC
  Administered 2022-01-28 – 2022-01-30 (×3): 40 mg via ORAL
  Filled 2022-01-28 (×3): qty 1

## 2022-01-28 MED ORDER — BISACODYL 5 MG PO TBEC: 5.0000 mg | DELAYED_RELEASE_TABLET | Freq: Every day | ORAL | Status: DC | PRN

## 2022-01-28 MED ORDER — FENTANYL CITRATE (PF) 100 MCG/2ML IJ SOLN
INTRAMUSCULAR | Status: DC | PRN
Start: 2022-01-28 — End: 2022-01-28
  Administered 2022-01-28: 50 ug via INTRAVENOUS

## 2022-01-28 MED ORDER — ACETAMINOPHEN 500 MG PO TABS
1000.0000 mg | ORAL_TABLET | Freq: Four times a day (QID) | ORAL | Status: AC
  Administered 2022-01-28 – 2022-01-29 (×4): 1000 mg via ORAL
  Filled 2022-01-28 (×4): qty 2

## 2022-01-28 MED ORDER — TRANEXAMIC ACID-NACL 1000-0.7 MG/100ML-% IV SOLN
1000.0000 mg | Freq: Once | INTRAVENOUS | Status: AC
  Administered 2022-01-28: 1000 mg via INTRAVENOUS
  Filled 2022-01-28: qty 100

## 2022-01-28 MED ORDER — ALBUMIN HUMAN 5 % IV SOLN
INTRAVENOUS | Status: AC
Start: 2022-01-28 — End: ?
  Filled 2022-01-28: qty 250

## 2022-01-28 MED ORDER — TRANEXAMIC ACID-NACL 1000-0.7 MG/100ML-% IV SOLN
1000.0000 mg | INTRAVENOUS | Status: AC
  Administered 2022-01-28: 1000 mg via INTRAVENOUS
  Filled 2022-01-28: qty 100

## 2022-01-28 MED ORDER — CEFAZOLIN SODIUM-DEXTROSE 2-4 GM/100ML-% IV SOLN
2.0000 g | INTRAVENOUS | Status: AC
  Administered 2022-01-28: 2 g via INTRAVENOUS
  Filled 2022-01-28: qty 100

## 2022-01-28 MED ORDER — KCL IN DEXTROSE-NACL 20-5-0.45 MEQ/L-%-% IV SOLN
INTRAVENOUS | Status: DC
  Filled 2022-01-28 (×4): qty 1000

## 2022-01-28 MED ORDER — DEXAMETHASONE SODIUM PHOSPHATE 10 MG/ML IJ SOLN
INTRAMUSCULAR | Status: AC
  Filled 2022-01-28: qty 1

## 2022-01-28 MED ORDER — GABAPENTIN 300 MG PO CAPS
300.0000 mg | ORAL_CAPSULE | Freq: Every morning | ORAL | Status: DC
  Administered 2022-01-29 – 2022-01-30 (×2): 300 mg via ORAL
  Filled 2022-01-28 (×2): qty 1

## 2022-01-28 MED ORDER — FLUTICASONE PROPIONATE 50 MCG/ACT NA SUSP: 1.0000 | Freq: Every day | NASAL | Status: DC | PRN

## 2022-01-28 MED ORDER — GABAPENTIN 300 MG PO CAPS: 300.0000 mg | ORAL_CAPSULE | Freq: Two times a day (BID) | ORAL | Status: DC

## 2022-01-28 MED ORDER — HYDROMORPHONE HCL 1 MG/ML IJ SOLN
0.5000 mg | INTRAMUSCULAR | Status: DC | PRN
  Administered 2022-01-28: 0.5 mg via INTRAVENOUS
  Filled 2022-01-28: qty 1

## 2022-01-28 MED ORDER — ASPIRIN 81 MG PO CHEW
81.0000 mg | CHEWABLE_TABLET | Freq: Two times a day (BID) | ORAL | Status: DC
  Administered 2022-01-28 – 2022-01-30 (×4): 81 mg via ORAL
  Filled 2022-01-28 (×4): qty 1

## 2022-01-28 MED ORDER — PHENYLEPHRINE HCL-NACL 20-0.9 MG/250ML-% IV SOLN
INTRAVENOUS | Status: DC | PRN
  Administered 2022-01-28: 30 ug/min via INTRAVENOUS

## 2022-01-28 MED ORDER — STERILE WATER FOR IRRIGATION IR SOLN
Status: DC | PRN
  Administered 2022-01-28: 2000 mL

## 2022-01-28 MED ORDER — GLYCOPYRROLATE 0.2 MG/ML IJ SOLN
INTRAMUSCULAR | Status: DC | PRN
  Administered 2022-01-28: .2 mg via INTRAVENOUS

## 2022-01-28 MED ORDER — DOCUSATE SODIUM 100 MG PO CAPS
100.0000 mg | ORAL_CAPSULE | Freq: Two times a day (BID) | ORAL | Status: DC
  Administered 2022-01-28 – 2022-01-30 (×4): 100 mg via ORAL
  Filled 2022-01-28 (×4): qty 1

## 2022-01-28 MED ORDER — TIZANIDINE HCL 2 MG PO TABS: 2.0000 mg | ORAL_TABLET | Freq: Four times a day (QID) | ORAL | 0 refills | Status: DC | PRN

## 2022-01-28 MED ORDER — ORAL CARE MOUTH RINSE: 15.0000 mL | Freq: Once | OROMUCOSAL | Status: AC

## 2022-01-28 MED ORDER — OXYCODONE HCL 5 MG PO TABS
5.0000 mg | ORAL_TABLET | ORAL | Status: DC | PRN
  Administered 2022-01-28 – 2022-01-29 (×2): 10 mg via ORAL
  Administered 2022-01-29: 5 mg via ORAL
  Administered 2022-01-29 – 2022-01-30 (×2): 10 mg via ORAL
  Filled 2022-01-28: qty 2
  Filled 2022-01-28: qty 1
  Filled 2022-01-28 (×3): qty 2

## 2022-01-28 MED ORDER — BUPIVACAINE LIPOSOME 1.3 % IJ SUSP
INTRAMUSCULAR | Status: DC | PRN
  Administered 2022-01-28: 20 mL

## 2022-01-28 MED ORDER — ONDANSETRON HCL 4 MG/2ML IJ SOLN
INTRAMUSCULAR | Status: AC
  Filled 2022-01-28: qty 2

## 2022-01-28 MED ORDER — LIDOCAINE HCL (PF) 2 % IJ SOLN
INTRAMUSCULAR | Status: AC
  Filled 2022-01-28: qty 5

## 2022-01-28 MED ORDER — DIPHENHYDRAMINE HCL 12.5 MG/5ML PO ELIX: 12.5000 mg | ORAL_SOLUTION | ORAL | Status: DC | PRN

## 2022-01-28 MED ORDER — PHENOL 1.4 % MT LIQD: 1.0000 | OROMUCOSAL | Status: DC | PRN

## 2022-01-28 MED ORDER — POVIDONE-IODINE 10 % EX SWAB: 2.0000 "application " | Freq: Once | CUTANEOUS | Status: DC

## 2022-01-28 MED ORDER — FUROSEMIDE 40 MG PO TABS
40.0000 mg | ORAL_TABLET | Freq: Every day | ORAL | Status: DC
  Administered 2022-01-29 – 2022-01-30 (×2): 40 mg via ORAL
  Filled 2022-01-28 (×2): qty 1

## 2022-01-28 MED ORDER — SODIUM CHLORIDE 0.9 % IR SOLN
Status: DC | PRN
  Administered 2022-01-28: 1000 mL

## 2022-01-28 MED ORDER — BUPIVACAINE-EPINEPHRINE 0.5% -1:200000 IJ SOLN
INTRAMUSCULAR | Status: DC | PRN
  Administered 2022-01-28: 30 mL

## 2022-01-28 MED ORDER — SODIUM CHLORIDE (PF) 0.9 % IJ SOLN
INTRAMUSCULAR | Status: AC
  Filled 2022-01-28: qty 10

## 2022-01-28 MED ORDER — BUPIVACAINE IN DEXTROSE 0.75-8.25 % IT SOLN
INTRATHECAL | Status: DC | PRN
  Administered 2022-01-28: 1.8 mL via INTRATHECAL

## 2022-01-28 MED ORDER — ALUM & MAG HYDROXIDE-SIMETH 200-200-20 MG/5ML PO SUSP: 30.0000 mL | ORAL | Status: DC | PRN

## 2022-01-28 MED ORDER — FERROUS SULFATE 325 (65 FE) MG PO TABS
650.0000 mg | ORAL_TABLET | Freq: Every day | ORAL | Status: DC
  Administered 2022-01-29 – 2022-01-30 (×2): 650 mg via ORAL
  Filled 2022-01-28 (×2): qty 2

## 2022-01-28 MED ORDER — LIDOCAINE HCL (CARDIAC) PF 100 MG/5ML IV SOSY
PREFILLED_SYRINGE | INTRAVENOUS | Status: DC | PRN
  Administered 2022-01-28: 50 mg via INTRAVENOUS

## 2022-01-28 MED ORDER — CHLORHEXIDINE GLUCONATE 0.12 % MT SOLN
15.0000 mL | Freq: Once | OROMUCOSAL | Status: AC
  Administered 2022-01-28: 15 mL via OROMUCOSAL

## 2022-01-28 MED ORDER — EPHEDRINE 5 MG/ML INJ
INTRAVENOUS | Status: AC
  Filled 2022-01-28: qty 5

## 2022-01-28 MED ORDER — GABAPENTIN 300 MG PO CAPS
900.0000 mg | ORAL_CAPSULE | Freq: Every day | ORAL | Status: DC
  Administered 2022-01-28 – 2022-01-29 (×2): 900 mg via ORAL
  Filled 2022-01-28 (×2): qty 3

## 2022-01-28 MED ORDER — METHOCARBAMOL 1000 MG/10ML IJ SOLN: 500.0000 mg | Freq: Four times a day (QID) | INTRAVENOUS | Status: DC | PRN

## 2022-01-28 MED ORDER — FLEET ENEMA 7-19 GM/118ML RE ENEM: 1.0000 | ENEMA | Freq: Once | RECTAL | Status: DC | PRN

## 2022-01-28 MED ORDER — BUPIVACAINE-EPINEPHRINE (PF) 0.5% -1:200000 IJ SOLN
INTRAMUSCULAR | Status: AC
  Filled 2022-01-28: qty 30

## 2022-01-28 MED ORDER — ACETAMINOPHEN 325 MG PO TABS: 325.0000 mg | ORAL_TABLET | Freq: Four times a day (QID) | ORAL | Status: DC | PRN

## 2022-01-28 MED ORDER — ONDANSETRON HCL 4 MG/2ML IJ SOLN: 4.0000 mg | Freq: Four times a day (QID) | INTRAMUSCULAR | Status: DC | PRN

## 2022-01-28 MED ORDER — OXYCODONE-ACETAMINOPHEN 5-325 MG PO TABS: 1.0000 | ORAL_TABLET | ORAL | 0 refills | Status: DC | PRN

## 2022-01-28 SURGICAL SUPPLY — 56 items
ACETAB CUP W/GRIPTION 54 (Plate) ×2 IMPLANT
APL PRP STRL LF DISP 70% ISPRP (MISCELLANEOUS) ×1
BAG COUNTER SPONGE SURGICOUNT (BAG) ×1 IMPLANT
BAG SPNG CNTER NS LX DISP (BAG) ×1
BIT DRILL 2.8X128 (BIT) ×2 IMPLANT
BLADE SAW SGTL 18X1.27X75 (BLADE) ×2 IMPLANT
BLADE SURG SZ10 CARB STEEL (BLADE) ×6 IMPLANT
BRUSH FEMORAL CANAL (MISCELLANEOUS) IMPLANT
CHLORAPREP W/TINT 26 (MISCELLANEOUS) ×2 IMPLANT
COVER SURGICAL LIGHT HANDLE (MISCELLANEOUS) ×3 IMPLANT
CUP ACETAB W/GRIPTION 54 (Plate) IMPLANT
DRAPE ORTHO SPLIT 77X108 STRL (DRAPES)
DRAPE SHEET LG 3/4 BI-LAMINATE (DRAPES) ×4 IMPLANT
DRAPE SURG ORHT 6 SPLT 77X108 (DRAPES) IMPLANT
DRAPE TOP 10253 STERILE (DRAPES) ×1 IMPLANT
DRAPE U-SHAPE 47X51 STRL (DRAPES) ×3 IMPLANT
DRSG AQUACEL AG ADV 3.5X10 (GAUZE/BANDAGES/DRESSINGS) ×2 IMPLANT
DRSG AQUACEL AG ADV 3.5X14 (GAUZE/BANDAGES/DRESSINGS) ×1 IMPLANT
ELECT BLADE TIP CTD 4 INCH (ELECTRODE) ×2 IMPLANT
ELECT REM PT RETURN 15FT ADLT (MISCELLANEOUS) ×6 IMPLANT
GLOVE BIO SURGEON STRL SZ7.5 (GLOVE) ×2 IMPLANT
GLOVE BIO SURGEON STRL SZ8.5 (GLOVE) ×3 IMPLANT
GLOVE BIOGEL PI IND STRL 9 (GLOVE) ×1 IMPLANT
GLOVE BIOGEL PI INDICATOR 9 (GLOVE) ×1
GOWN STRL REUS W/ TWL XL LVL3 (GOWN DISPOSABLE) ×4 IMPLANT
GOWN STRL REUS W/TWL XL LVL3 (GOWN DISPOSABLE) ×4
HANDPIECE INTERPULSE COAX TIP (DISPOSABLE)
HEAD FEM DLT TS CER 36X+0 (Hips) ×1 IMPLANT
HOOD PEEL AWAY FLYTE STAYCOOL (MISCELLANEOUS) ×9 IMPLANT
KIT BASIN OR (CUSTOM PROCEDURE TRAY) ×2 IMPLANT
KIT TURNOVER KIT A (KITS) ×1 IMPLANT
LINER NEUTRAL 54X36MM PLUS 4 (Hips) ×1 IMPLANT
MANIFOLD NEPTUNE II (INSTRUMENTS) ×2 IMPLANT
NDL HYPO 21X1.5 SAFETY (NEEDLE) ×4 IMPLANT
NEEDLE HYPO 21X1.5 SAFETY (NEEDLE) ×4 IMPLANT
NS IRRIG 1000ML POUR BTL (IV SOLUTION) ×2 IMPLANT
PACK TOTAL JOINT (CUSTOM PROCEDURE TRAY) ×2 IMPLANT
PASSER SUT SWANSON 36MM LOOP (INSTRUMENTS) ×2 IMPLANT
PRESSURIZER FEMORAL UNIV (MISCELLANEOUS) IMPLANT
PROTECTOR NERVE ULNAR (MISCELLANEOUS) ×3 IMPLANT
SET HNDPC FAN SPRY TIP SCT (DISPOSABLE) IMPLANT
SLEEVE FEM PROX 20F LRG (Hips) ×1 IMPLANT
STEM FEM MOD STD 42 15X20X165 (Hips) ×1 IMPLANT
SUT ETHIBOND 2 V 37 (SUTURE) ×2 IMPLANT
SUT VIC AB 0 CTX 27 (SUTURE) ×3 IMPLANT
SUT VIC AB 1 CTX 36 (SUTURE) ×2
SUT VIC AB 1 CTX36XBRD ANBCTR (SUTURE) ×1 IMPLANT
SUT VIC AB 2-0 CT1 27 (SUTURE) ×2
SUT VIC AB 2-0 CT1 TAPERPNT 27 (SUTURE) ×1 IMPLANT
SUT VIC AB 3-0 CT1 27 (SUTURE) ×2
SUT VIC AB 3-0 CT1 TAPERPNT 27 (SUTURE) ×2 IMPLANT
SYR CONTROL 10ML LL (SYRINGE) ×4 IMPLANT
TOWEL OR 17X26 10 PK STRL BLUE (TOWEL DISPOSABLE) ×4 IMPLANT
TOWER CARTRIDGE SMART MIX (DISPOSABLE) IMPLANT
TRAY CATH INTERMITTENT SS 16FR (CATHETERS) ×3 IMPLANT
WATER STERILE IRR 1000ML POUR (IV SOLUTION) ×4 IMPLANT

## 2022-01-28 NOTE — Transfer of Care (Signed)
Immediate Anesthesia Transfer of Care Note  Patient: Timothy Sheppard  Procedure(s) Performed: RIGHT TOTAL HIP ARTHROPLASTY POSTERIOR, REMOVAL OF K NAIL (Right: Hip)  Patient Location: PACU  Anesthesia Type:Spinal  Level of Consciousness: awake, alert , oriented and patient cooperative  Airway & Oxygen Therapy: Patient Spontanous Breathing and Patient connected to face mask oxygen  Post-op Assessment: Report given to RN and Post -op Vital signs reviewed and stable  Post vital signs: Reviewed and stable  Last Vitals:  Vitals Value Taken Time  BP 128/66 01/28/22 1023  Temp    Pulse 87 01/28/22 1025  Resp 18 01/28/22 1025  SpO2 100 % 01/28/22 1025  Vitals shown include unvalidated device data.  Last Pain:  Vitals:   01/28/22 0558  TempSrc:   PainSc: 0-No pain      Patients Stated Pain Goal: 4 (82/51/89 8421)  Complications: No notable events documented.

## 2022-01-28 NOTE — Care Plan (Signed)
Ortho Bundle Case Management Note  Patient Details  Name: Timothy Sheppard MRN: [id number] Date of Birth: [birth year]    Spoke with patient prior to surgery. Will discharge to home with family to assist. Has equipment at home. OPPT set up with [facility]. Patient and MD in agreement with plan. Choice offered                   DME Arranged:    DME Agency:     HH Arranged:    HH Agency:     Additional Comments: Please contact me with any questions of if this plan should need to change.  Ladell Heads,  [facility]  [phone] 01/28/2022, 9:36 AM

## 2022-01-28 NOTE — Interval H&P Note (Signed)
History and Physical Interval Note:  01/28/2022 7:10 AM  Timothy Sheppard  has presented today for surgery, with the diagnosis of RIGHT HIP OSTEOARTHRITIS WITH RETAINED BROKEN K NAIL.  The various methods of treatment have been discussed with the patient and family. After consideration of risks, benefits and other options for treatment, the patient has consented to  Procedure(s): RIGHT TOTAL HIP ARTHROPLASTY POSTERIOR, REMOVAL OF K NAIL (Right) as a surgical intervention.  The patient's history has been reviewed, patient examined, no change in status, stable for surgery.  I have reviewed the patient's chart and labs.  Questions were answered to the patient's satisfaction.     Kerin Salen

## 2022-01-28 NOTE — Evaluation (Signed)
Physical Therapy Evaluation Patient Details Name: Timothy Sheppard MRN: [id number] DOB: 1946-05-26 Today's Date: 01/28/2022  History of Present Illness  Pt is a 76yo male presenting s/p R-THA, posterior approach after IM nail removal on 01/28/22.  PMH: CHF, HTN, HLD, neuropathy, R-femur ORIF 1967, back surgery 2000  Clinical Impression  OWEN PRATTE is a 76 y.o. male POD0 s/p procedure above with associated posterior hip precautions. Patient reports modified independence using crutches for mobility at baseline. Patient is now limited by functional impairments (see PT problem list below) and requires min assist for bed mobility and min guard for transfers. Patient was able to ambulate 15 feet with RW and min guard level of assist. Provided posterior hip precautions handout, reviewed with pt and family, and used teach back method to check understanding at end of session; pt responded appropriately. Patient instructed in exercise to facilitate ROM and circulation to manage edema. Provided incentive spirometer and with Vcs pt able to achieve 1712m. Patient will benefit from continued skilled PT interventions to address impairments and progress towards PLOF. Acute PT will follow to progress mobility and stair training in preparation for safe discharge home.         Recommendations for follow up therapy are one component of a multi-disciplinary discharge planning process, led by the attending physician.  Recommendations may be updated based on patient status, additional functional criteria and insurance authorization.  Follow Up Recommendations Follow physician's recommendations for discharge plan and follow up therapies      Assistance Recommended at Discharge Intermittent Supervision/Assistance  Patient can return home with the following  A little help with walking and/or transfers;A little help with bathing/dressing/bathroom;Assistance with cooking/housework;Assist for transportation;Help with  stairs or ramp for entrance    Equipment Recommendations None recommended by PT  Recommendations for Other Services       Functional Status Assessment Patient has had a recent decline in their functional status and demonstrates the ability to make significant improvements in function in a reasonable and predictable amount of time.     Precautions / Restrictions Precautions Precautions: Posterior Hip Precaution Booklet Issued: Yes (comment) Precaution Comments: Reviewed posterior precautions Restrictions Weight Bearing Restrictions: No Other Position/Activity Restrictions: WBAT      Mobility  Bed Mobility Overal bed mobility: Needs Assistance Bed Mobility: Supine to Sit     Supine to sit: Min assist     General bed mobility comments: Min assist to bring RLE off bed, VCs to observe posterior precautions.    Transfers Overall transfer level: Needs assistance Equipment used: Rolling walker (2 wheels) Transfers: Sit to/from Stand Sit to Stand: Min guard, From elevated surface           General transfer comment: Min guard for safety only from elevated surface, no physical assist required, VCs to observe posterior precautions.    Ambulation/Gait Ambulation/Gait assistance: Min guard, +2 safety/equipment Gait Distance (Feet): 15 Feet Assistive device: Rolling walker (2 wheels) Gait Pattern/deviations: Step-to pattern, Decreased stance time - right, Decreased dorsiflexion - right, Decreased weight shift to right, Trunk flexed Gait velocity: decreased     General Gait Details: Pt ambulated 128fwith RW and min guard assist, +2 for recliner follow for safety only, no physical assist required or overt LOB noted. Pt began ambulation task with heavy use of BUE, but as distance progressed, pt able to more comfortably weightbear on R foot.  Stairs            Wheelchair Mobility    Modified Rankin (  Stroke Patients Only)       Balance Overall balance assessment:  Needs assistance Sitting-balance support: Feet supported, No upper extremity supported Sitting balance-Leahy Scale: Good     Standing balance support: Reliant on assistive device for balance, During functional activity, Bilateral upper extremity supported Standing balance-Leahy Scale: Poor                               Pertinent Vitals/Pain Pain Assessment Pain Assessment: 0-10 Pain Score: 7  Pain Location: right hip Pain Descriptors / Indicators: Operative site guarding, Discomfort Pain Intervention(s): Limited activity within patient's tolerance, Monitored during session, Repositioned, Ice applied    Home Living Family/patient expects to be discharged to:: Private residence Living Arrangements: Spouse/significant other;Other relatives (Wife debbie, grandson Aeronautical engineer) Available Help at Discharge: Family;Available 24 hours/day Type of Home: House Home Access: Stairs to enter Entrance Stairs-Rails: None Entrance Stairs-Number of Steps: 4   Home Layout: One level Home Equipment: Conservation officer, nature (2 wheels);Cane - single point;BSC/3in1;Grab bars - toilet;Grab bars - tub/shower;Shower seat;Crutches      Prior Function Prior Level of Function : Independent/Modified Independent             Mobility Comments: Crutches at all time ADLs Comments: Wife helps with sponge bathing at baseline, pt unable to step into tub     Hand Dominance        Extremity/Trunk Assessment   Upper Extremity Assessment Upper Extremity Assessment: Overall WFL for tasks assessed    Lower Extremity Assessment Lower Extremity Assessment: RLE deficits/detail;LLE deficits/detail RLE Deficits / Details: MMT ank PF/DF 5/5 RLE Sensation: history of peripheral neuropathy LLE Deficits / Details: MMT ank PF/DF 5/5 LLE Sensation: history of peripheral neuropathy    Cervical / Trunk Assessment Cervical / Trunk Assessment: Back Surgery  Communication      Cognition Arousal/Alertness:  Awake/alert Behavior During Therapy: WFL for tasks assessed/performed Overall Cognitive Status: Within Functional Limits for tasks assessed                                          General Comments General comments (skin integrity, edema, etc.): Wife and daughter present for session    Exercises Total Joint Exercises Ankle Circles/Pumps: AROM, Both, 10 reps, Seated   Assessment/Plan    PT Assessment Patient needs continued PT services  PT Problem List Decreased strength;Decreased range of motion;Decreased activity tolerance;Decreased balance;Decreased mobility;Decreased coordination;Decreased knowledge of use of DME;Pain       PT Treatment Interventions DME instruction;Gait training;Stair training;Functional mobility training;Therapeutic activities;Therapeutic exercise;Balance training;Neuromuscular re-education;Patient/family education    PT Goals (Current goals can be found in the Care Plan section)  Acute Rehab PT Goals Patient Stated Goal: Mowing the yard PT Goal Formulation: With patient Time For Goal Achievement: 02/04/22 Potential to Achieve Goals: Good    Frequency 7X/week     Co-evaluation               AM-PAC PT "6 Clicks" Mobility  Outcome Measure Help needed turning from your back to your side while in a flat bed without using bedrails?: None Help needed moving from lying on your back to sitting on the side of a flat bed without using bedrails?: A Little Help needed moving to and from a bed to a chair (including a wheelchair)?: A Little Help needed standing up from a chair using your  arms (e.g., wheelchair or bedside chair)?: A Little Help needed to walk in hospital room?: A Little Help needed climbing 3-5 steps with a railing? : A Little 6 Click Score: 19    End of Session Equipment Utilized During Treatment: Gait belt Activity Tolerance: Patient tolerated treatment well;No increased pain Patient left: in chair;with call bell/phone  within reach;with family/visitor present;with SCD's reapplied Nurse Communication: Mobility status PT Visit Diagnosis: Pain;Difficulty in walking, not elsewhere classified (R26.2) Pain - Right/Left: Right Pain - part of body: Hip    Time: 1400-1430 PT Time Calculation (min) (ACUTE ONLY): 30 min   Charges:   PT Evaluation $PT Eval Low Complexity: 1 Low PT Treatments $Gait Training: 8-22 mins        Coolidge Breeze, PT, DPT [facility] Department Office: [phone] Pager: [phone]  Coolidge Breeze 01/28/2022, 2:36 PM

## 2022-01-28 NOTE — Op Note (Signed)
PATIENT ID:      Timothy Sheppard  MRN:     [id number] DOB/AGE:    1946-05-29 / 76 y.o.       OPERATIVE REPORT    DATE OF PROCEDURE:  01/28/2022       PREOPERATIVE DIAGNOSIS:  RIGHT HIP OSTEOARTHRITIS WITH RETAINED BROKEN K NAIL                                                       Estimated body mass index is 38.22 kg/m as calculated from the following:   Height as of this encounter: '5\' 11"'$  (1.803 m).   Weight as of this encounter: 124.3 kg.     POSTOPERATIVE DIAGNOSIS: Same                                                         PROCEDURE: Removal of broken cane nail placed 56 years ago from right hip and femur, right total hip arthroplasty using a 54 mm DePuy Gryption sector Cup, [employer], 10-degree polyethylene liner index superior  and posterior, a +0 36 mm ceramic head, a 20 x150x42 SROM Stem, 20 F large sleeve  SURGEON: Kerin Salen    ASSISTANT:   Kerry Hough. Barton Dubois  (present throughout entire procedure and necessary for timely completion of the procedure)  ANESTHESIA: Spinal BLOOD LOSS: 600 cc FLUID REPLACEMENT: 2000 crystalloid, 1 unit albumin Tranexamic Acid: 1gm IV, 2gm Topical Exparel: 10cc DRAINS: None COMPLICATIONS: None    INDICATIONS FOR PROCEDURE: Patient with end-stage arthritis of the right hip complicated by a broken cane nail with a fracture point of the [place] at the level of the femoral neck.  The cannula backed out at some point and the tip of it was actually eroding into the iliac wing.  X-rays show bone-on-bone arthritic changes, peri chondral cyts. Despite conservative measures with observation, anti-inflammatory medicine, narcotics, use of a cane, has severe unremitting pain and can ambulate only 1/2 blocks before resting. Patient desires elective right total hip arthroplasty to decrease pain and increase function. The risks, benefits, and alternatives were discussed at length including but not limited to the risks of infection, bleeding, nerve  injury, stiffness, blood clots, the need for revision surgery, cardiopulmonary complications, among others, and they were willing to proceed.Benefits have been discussed. Questions answered.     PROCEDURE IN DETAIL: The patient was identified by armband,  received preoperative IV antibiotics in the holding area at [facility], taken to the operating room , appropriate anesthetic monitors  were attached and general endotracheal anesthesia induced. Foley catheter was inserted. Patient was rolled into the left lateral decubitus position and fixed there with a Stulberg Mark II pelvic clamp and the right lower extremity was then prepped and draped  in the usual sterile fashion from the ankle to the hemipelvis. A time-out  procedure was performed. Kerry Hough. Hardin Negus [facility] was present and scrubbed throughout the case, critical for assistance with, positioning, exposure, retraction, instrumentation, and closure.The skin along the lateral hip and thigh  infiltrated with 10 mL of 0.5% Marcaine and epinephrine solution. We  then made a posterolateral approach  to the hip. With a #10 blade, 20 cm  incision through skin and subcutaneous tissue down to the level of the  IT band. Small bleeders were identified and cauterized. IT band cut in  line with skin incision exposing the greater trochanter. A Cobra retractor was placed between the gluteus minimus and the superior hip joint capsule, and a spiked Cobra between the quadratus femoris and the inferior hip joint capsule. This isolated the short  external rotators and piriformis tendons. These were tagged with a #2 Ethibond  suture and cut off their insertion on the intertrochanteric crest. The posterior  capsule was then developed into an acetabular-based flap from Posterior Superior off of the acetabulum out over the femoral neck and back posterior inferior to the acetabular rim. This flap was tagged with two #2 Ethibond sutures and retracted protecting the  sciatic nerve. This exposed the arthritic femoral head and osteophytes.  We then dissected through the posterior fibers of the gluteus medius as it approached the greater trochanter and identified the superior broken half of the nail.  This was grasped with a small bone hook and came out without any difficulty.  The hip was then flexed and internally rotated, dislocating the femoral head and a standard neck cut performed 1 fingerbreadth above the lesser trochanter.  The distal half of the broken nail was visualized after the neck cut was made.  Using 1/4 inch osteotome we opened the bone around the proximal end of the cannula and then were able to remove it grasping it with a large needle driver.  A spiked Cobra was placed in the cotyloid notch and a Hohmann retractor was then used to lever the femur anteriorly off of the anterior pelvic column. A posterior-inferior wing retractor was placed at the junction of the acetabulum and the ischium completing the acetabular exposure.We then removed the peripheral osteophytes and labrum from the acetabulum. We then reamed the acetabulum up to 53 mm with basket reamers obtaining good coverage in all quadrants, irrigated out with normal  saline solution and hammered into place a 54 Gryption sector cup in 45  degrees of abduction and about 20 degrees of anteversion. More  peripheral osteophytes removed, the apex hole eliminator was placed, and a +4, 0-degree liner placed. The hip was then flexed and internally rotated exposing the  proximal femur, which was entered with the box cutting chisel, the initiating reamer followed by axial reaming up to 15.5 mm full depth, and 16 partial depth. We then conically reamed up to 20 F. And milled the calcar to large. We then placed the trial sleeve, and a trial stem. A trial reduction was then  performed with a +0  36-mm ball on the standard neck and  excellent stability was noted with at 90 of flexion with 70 of  internal rotation  and then full extension withexternal rotation. The hip  could not be dislocated in full extension. The knee could easily flex  to about 140 degrees. We also stretched the abductors at this point,  because of the preexisting adductor contractures. All trial components  were then removed. The real sleeve was then hammered into place, through the sleeve we reamed with a 15.5 reamer to ensure that the stem did not become incarcerated. The stem itself was then inserted in 30 degrees anteversion in relation to the calcar.  At this point, a + 0 by 36-mm ceramic head was  hammered on the stem. The hip was reduced. We checked our stability  one more time and found to be excellent. The wound was once again  thoroughly irrigated out with normal saline solution pulse lavage. The  capsular flap and short external rotators were repaired back to the  intertrochanteric crest through drill holes with a #2 Ethibond suture.  The IT band was closed with running 1 Vicryl suture. The subcutaneous  tissue with 0 and 2-0 undyed Vicryl suture and the skin with running  3-0 Vivryl SQ suture. Aquacil dessing was applied. The patient was then unclamped, rolled supine, awaken extubated and taken to recovery room without difficulty in stable condition.   Kerin Salen 01/28/2022, 7:11 AM

## 2022-01-28 NOTE — Discharge Instructions (Signed)

## 2022-01-28 NOTE — Plan of Care (Signed)
  Problem: Education: Goal: Knowledge of General Education information will improve Description: Including pain rating scale, medication(s)/side effects and non-pharmacologic comfort measures Outcome: Progressing   Problem: Nutrition: Goal: Adequate nutrition will be maintained Outcome: Progressing   Problem: Elimination: Goal: Will not experience complications related to bowel motility Outcome: Progressing   Problem: Pain Managment: Goal: General experience of comfort will improve Outcome: Progressing   Problem: Education: Goal: Knowledge of the prescribed therapeutic regimen will improve Outcome: Progressing   Problem: Activity: Goal: Ability to avoid complications of mobility impairment will improve Outcome: Progressing   Problem: Pain Management: Goal: Pain level will decrease with appropriate interventions Outcome: Progressing

## 2022-01-28 NOTE — Anesthesia Procedure Notes (Signed)
Spinal  Patient location during procedure: OR Start time: 01/28/2022 7:25 AM End time: 01/28/2022 7:40 AM Reason for block: at surgeon's request Staffing Performed: anesthesiologist  Anesthesiologist: Belinda Block, MD Performed by: Belinda Block, MD Authorized by: Belinda Block, MD   Preanesthetic Checklist Completed: patient identified, IV checked, site marked, risks and benefits discussed, surgical consent, monitors and equipment checked, pre-op evaluation and timeout performed Spinal Block Patient position: sitting Prep: ChloraPrep Patient monitoring: heart rate, cardiac monitor, continuous pulse ox and blood pressure Location: L3-4 Injection technique: single-shot Needle Needle type: Introducer and Whitacre  Needle gauge: 22 G Assessment Sensory level: T12 Additional Notes Clear CSF Pt tolerated procedure well. Attempt by CRNA Bennett Scrape prior

## 2022-01-28 NOTE — Anesthesia Postprocedure Evaluation (Signed)
Anesthesia Post Note  Patient: Timothy Sheppard  Procedure(s) Performed: RIGHT TOTAL HIP ARTHROPLASTY POSTERIOR, REMOVAL OF K NAIL (Right: Hip)     Patient location during evaluation: PACU Anesthesia Type: Spinal Level of consciousness: awake Pain management: pain level controlled Vital Signs Assessment: post-procedure vital signs reviewed and stable Respiratory status: spontaneous breathing Cardiovascular status: stable Postop Assessment: no apparent nausea or vomiting Anesthetic complications: no   No notable events documented.  Last Vitals:  Vitals:   01/28/22 1130 01/28/22 1216  BP: (!) 133/92 139/82  Pulse: 84 91  Resp: 17 18  Temp: 36.6 C 36.4 C  SpO2: 92% 93%    Last Pain:  Vitals:   01/28/22 1216  TempSrc: Oral  PainSc:                  Trishna Cwik

## 2022-01-29 ENCOUNTER — Encounter (HOSPITAL_COMMUNITY): Payer: Self-pay | Admitting: Orthopedic Surgery

## 2022-01-29 NOTE — Progress Notes (Signed)
Physical Therapy Treatment Patient Details Name: Timothy Sheppard MRN: [id number] DOB: 08/12/1945 Today's Date: 01/29/2022   History of Present Illness Pt is a 76yo male presenting s/p R-THA, posterior approach after IM nail removal on 01/28/22.  PMH: CHF, HTN, HLD, neuropathy, R-femur ORIF 1967, back surgery 2000    PT Comments    Pt seen for second of two visits POD1.  Pt supine in bed agreeable to be seen. Reviewed posterior hip precautions via teachback method and pt able to adequately list movements to avoid. Pt min guard for bed mobility, transfers, ambulation, and stair training and pt able to maintain posterior hip precautions with minimal cuing. Pt did not have any episodes of dizziness or BLE "giving way" this session which pt attributed to the oxycodon he had taken prior to the first session today. Reviewed HEP and pt completed another set of exercises with pt and family member, pt demonstrated good technique with no questions, reporting decreased soreness. Provided handouts for stair mobility and car transfer and reviewed technique with verbal descriptions and demonstration, pt and caregiver verbalized understanding. Encouraged hydration and eating well to aid recovery and provided additional ice water. Discharge destination remains appropriate; we will continue to follow the pt acutely.     Recommendations for follow up therapy are one component of a multi-disciplinary discharge planning process, led by the attending physician.  Recommendations may be updated based on patient status, additional functional criteria and insurance authorization.  Follow Up Recommendations  Follow physician's recommendations for discharge plan and follow up therapies     Assistance Recommended at Discharge Intermittent Supervision/Assistance  Patient can return home with the following A little help with walking and/or transfers;A little help with bathing/dressing/bathroom;Assistance with  cooking/housework;Assist for transportation;Help with stairs or ramp for entrance   Equipment Recommendations  None recommended by PT    Recommendations for Other Services       Precautions / Restrictions Precautions Precautions: Posterior Hip Precaution Booklet Issued: Yes (comment) Precaution Comments: Reviewed posterior precautions via teach back method Restrictions Weight Bearing Restrictions: No Other Position/Activity Restrictions: WBAT     Mobility  Bed Mobility Overal bed mobility: Needs Assistance Bed Mobility: Supine to Sit     Supine to sit: Min guard     General bed mobility comments: for safety only    Transfers Overall transfer level: Needs assistance Equipment used: Rolling walker (2 wheels) Transfers: Sit to/from Stand Sit to Stand: Min guard, From elevated surface           General transfer comment: Min guard for safety only from elevated surface, no physical assist required, VCs to observe posterior precautions and to power up from bed using upper extremity.    Ambulation/Gait Ambulation/Gait assistance: Min guard, +2 safety/equipment Gait Distance (Feet): 40 Feet Assistive device: Rolling walker (2 wheels) Gait Pattern/deviations: Step-to pattern, Decreased stance time - right, Decreased dorsiflexion - right, Decreased weight shift to right, Trunk flexed Gait velocity: decreased     General Gait Details: Pt ambulated 22f with RW and min guard assist, no physical assist required or overt LOB noted., +2 for recliner follow for safety only. VCs for proximity to device.   Stairs Stairs: Yes Stairs assistance: Min guard, +2 safety/equipment Stair Management: One rail Left, Step to pattern, Forwards, With crutches Number of Stairs: 2 General stair comments: Pt educated on stair mobility with 1 rail + 1 crutch, verbalized understanding. Pt required min guard for safety, no physical assist required or overt LOB noted, VCs for  sequencing.   Wheelchair Mobility    Modified Rankin (Stroke Patients Only)       Balance Overall balance assessment: Needs assistance Sitting-balance support: Feet supported, No upper extremity supported Sitting balance-Leahy Scale: Good     Standing balance support: Reliant on assistive device for balance, During functional activity, Bilateral upper extremity supported Standing balance-Leahy Scale: Poor                              Cognition Arousal/Alertness: Awake/alert Behavior During Therapy: WFL for tasks assessed/performed Overall Cognitive Status: Within Functional Limits for tasks assessed                                          Exercises Total Joint Exercises Ankle Circles/Pumps: AROM, Both, Seated, 20 reps Quad Sets: AROM, Both, 10 reps Short Arc Quad: AROM, Right, 10 reps Heel Slides: Right, 10 reps, AAROM (with belt) Hip ABduction/ADduction: AAROM, Right, 10 reps (with belt)    General Comments        Pertinent Vitals/Pain Pain Assessment Pain Assessment: 0-10 Pain Score: 5  Pain Location: right hip Pain Descriptors / Indicators: Operative site guarding, Discomfort Pain Intervention(s): Limited activity within patient's tolerance, Monitored during session, Repositioned, Ice applied    Home Living                          Prior Function            PT Goals (current goals can now be found in the care plan section) Acute Rehab PT Goals Patient Stated Goal: Mowing the yard PT Goal Formulation: With patient Time For Goal Achievement: 02/04/22 Potential to Achieve Goals: Good Progress towards PT goals: Progressing toward goals    Frequency    7X/week      PT Plan Current plan remains appropriate    Co-evaluation              AM-PAC PT "6 Clicks" Mobility   Outcome Measure  Help needed turning from your back to your side while in a flat bed without using bedrails?: None Help needed  moving from lying on your back to sitting on the side of a flat bed without using bedrails?: A Little Help needed moving to and from a bed to a chair (including a wheelchair)?: A Little Help needed standing up from a chair using your arms (e.g., wheelchair or bedside chair)?: A Little Help needed to walk in hospital room?: A Little Help needed climbing 3-5 steps with a railing? : A Little 6 Click Score: 19    End of Session Equipment Utilized During Treatment: Gait belt Activity Tolerance: Patient limited by fatigue Patient left: in chair;with call bell/phone within reach;with SCD's reapplied;with family/visitor present Nurse Communication: Mobility status PT Visit Diagnosis: Pain;Difficulty in walking, not elsewhere classified (R26.2) Pain - Right/Left: Right Pain - part of body: Hip     Time: 1411-1437 PT Time Calculation (min) (ACUTE ONLY): 26 min  Charges:  $Gait Training: 8-22 mins $Therapeutic Exercise: 8-22 mins                     Coolidge Breeze, PT, DPT [facility] Office: [phone] Pager: [phone]   Coolidge Breeze 01/29/2022, 3:11 PM

## 2022-01-29 NOTE — TOC Transition Note (Signed)
Transition of Care [facility]) - CM/SW Discharge Note  Patient Details  Name: Timothy Sheppard MRN: [id number] Date of Birth: 05-05-1946  Transition of Care [facility]) CM/SW Contact:  Sherie Don, LCSW Phone Number: 01/29/2022, 10:32 AM  Clinical Narrative: Patient is expected to discharge home after working with PT. CSW met with patient to confirm discharge plan. Patient will go home with OPPT at [facility] in [place]. Patient has a rolling walker, BSC, cane, and crutches at home so there are no DME needs at this time. TOC signing off.  Final next level of care: OP Rehab Barriers to Discharge: No Barriers Identified  Patient Goals and CMS Choice Patient states their goals for this hospitalization and ongoing recovery are:: Discharge home with OPPT at [facility] in [place] Choice offered to / list presented to : NA  Discharge Plan and Services        DME Arranged: N/A DME Agency: NA  Readmission Risk Interventions     No data to display

## 2022-01-29 NOTE — Progress Notes (Signed)
Physical Therapy Treatment Patient Details Name: Timothy Sheppard MRN: [id number] DOB: 1946/03/08 Today's Date: 01/29/2022   History of Present Illness Pt is a 76yo male presenting s/p R-THA, posterior approach after IM nail removal on 01/28/22.  PMH: CHF, HTN, HLD, neuropathy, R-femur ORIF 1967, back surgery 2000    PT Comments    Pt seen for first of two sessions POD1. Pt supine in bed reporting difficulty with pain control overnight and associated lack of sleep but very eager and motivated to participate in therapy, reporting pain 3/10. Required min guard for all mobility tasks today including bed mobility, transfers, and ambulation in hallway with RW ~72f. Toward end of ambulation distance, pt reporting "my legs are about to give out," indicating high level of fatigue, reporting increased pain (7/10) and mild dizziness as well, returned pt to seated position in recliner, BP 110/67. With increased seated resting time pt reporting reduction in dizziness but pain and fatigue remain, likely secondary to deconditioning and reduced use of RLE prior to surgery. Provided HEP and pt completed with multimodal cuing and rest breaks between exercises, encouraged use of gait belt to provide active-assistive motion, pt verbalized understanding. Will attempt to schedule second session with pain medication administration to promote activity tolerance. We will continue to follow acutely.     Recommendations for follow up therapy are one component of a multi-disciplinary discharge planning process, led by the attending physician.  Recommendations may be updated based on patient status, additional functional criteria and insurance authorization.  Follow Up Recommendations  Follow physician's recommendations for discharge plan and follow up therapies     Assistance Recommended at Discharge Intermittent Supervision/Assistance  Patient can return home with the following A little help with walking and/or  transfers;A little help with bathing/dressing/bathroom;Assistance with cooking/housework;Assist for transportation;Help with stairs or ramp for entrance   Equipment Recommendations  None recommended by PT    Recommendations for Other Services       Precautions / Restrictions Precautions Precautions: Posterior Hip Precaution Booklet Issued: Yes (comment) Precaution Comments: Reviewed posterior precautions via teach back method Restrictions Weight Bearing Restrictions: No Other Position/Activity Restrictions: WBAT     Mobility  Bed Mobility Overal bed mobility: Needs Assistance Bed Mobility: Supine to Sit     Supine to sit: Min guard     General bed mobility comments: for safety only    Transfers Overall transfer level: Needs assistance Equipment used: Rolling walker (2 wheels) Transfers: Sit to/from Stand Sit to Stand: Min guard, From elevated surface           General transfer comment: Min guard for safety only from elevated surface, no physical assist required, VCs to observe posterior precautions and to power up from bed using upper extremity.    Ambulation/Gait Ambulation/Gait assistance: Min guard Gait Distance (Feet): 35 Feet Assistive device: Rolling walker (2 wheels) Gait Pattern/deviations: Step-to pattern, Decreased stance time - right, Decreased dorsiflexion - right, Decreased weight shift to right, Trunk flexed Gait velocity: decreased     General Gait Details: Pt ambulated 321fwith RW and min guard assist, no physical assist required or overt LOB noted. After 3544fpt reporting BLE weakness and "my legs are about to give out," rehab tech provided recliner. Pt reporting some dizziness and degree of fatigue, BP 110/67. Further mobility deferred.   Stairs             Wheelchair Mobility    Modified Rankin (Stroke Patients Only)       Balance Overall  balance assessment: Needs assistance Sitting-balance support: Feet supported, No upper  extremity supported Sitting balance-Leahy Scale: Good     Standing balance support: Reliant on assistive device for balance, During functional activity, Bilateral upper extremity supported Standing balance-Leahy Scale: Poor                              Cognition Arousal/Alertness: Awake/alert Behavior During Therapy: WFL for tasks assessed/performed Overall Cognitive Status: Within Functional Limits for tasks assessed                                          Exercises Total Joint Exercises Ankle Circles/Pumps: AROM, Both, Seated, 20 reps Quad Sets: AROM, Both, 10 reps Short Arc Quad: AROM, Right, 10 reps Heel Slides: Right, 10 reps, AAROM (with belt) Hip ABduction/ADduction: AAROM, Right, 10 reps (with belt)    General Comments        Pertinent Vitals/Pain Pain Assessment Pain Assessment: 0-10 Pain Score: 3  Pain Location: right hip Pain Descriptors / Indicators: Operative site guarding, Discomfort Pain Intervention(s): Limited activity within patient's tolerance, Monitored during session, Repositioned, Ice applied    Home Living                          Prior Function            PT Goals (current goals can now be found in the care plan section) Acute Rehab PT Goals Patient Stated Goal: Mowing the yard PT Goal Formulation: With patient Time For Goal Achievement: 02/04/22 Potential to Achieve Goals: Good Progress towards PT goals: Progressing toward goals    Frequency    7X/week      PT Plan      Co-evaluation              AM-PAC PT "6 Clicks" Mobility   Outcome Measure  Help needed turning from your back to your side while in a flat bed without using bedrails?: None Help needed moving from lying on your back to sitting on the side of a flat bed without using bedrails?: A Little Help needed moving to and from a bed to a chair (including a wheelchair)?: A Little Help needed standing up from a chair using  your arms (e.g., wheelchair or bedside chair)?: A Little Help needed to walk in hospital room?: A Little Help needed climbing 3-5 steps with a railing? : A Little 6 Click Score: 19    End of Session Equipment Utilized During Treatment: Gait belt Activity Tolerance: Patient limited by fatigue Patient left: in chair;with call bell/phone within reach;with SCD's reapplied;with chair alarm set Nurse Communication: Mobility status PT Visit Diagnosis: Pain;Difficulty in walking, not elsewhere classified (R26.2) Pain - Right/Left: Right Pain - part of body: Hip     Time: 0277-4128 PT Time Calculation (min) (ACUTE ONLY): 35 min  Charges:  $Gait Training: 8-22 mins $Therapeutic Exercise: 8-22 mins                     Coolidge Breeze, PT, DPT [facility] Office: [phone] Pager: [phone]   Coolidge Breeze 01/29/2022, 10:32 AM

## 2022-01-29 NOTE — Plan of Care (Signed)
  Problem: Education: Goal: Knowledge of General Education information will improve Description: Including pain rating scale, medication(s)/side effects and non-pharmacologic comfort measures Outcome: Progressing   Problem: Health Behavior/Discharge Planning: Goal: Ability to manage health-related needs will improve Outcome: Progressing   Problem: Clinical Measurements: Goal: Ability to maintain clinical measurements within normal limits will improve Outcome: Progressing Goal: Will remain free from infection Outcome: Progressing Goal: Cardiovascular complication will be avoided Outcome: Progressing   Problem: Activity: Goal: Risk for activity intolerance will decrease Outcome: Progressing   Problem: Nutrition: Goal: Adequate nutrition will be maintained Outcome: Progressing   Problem: Coping: Goal: Level of anxiety will decrease Outcome: Progressing   Problem: Elimination: Goal: Will not experience complications related to bowel motility Outcome: Progressing Goal: Will not experience complications related to urinary retention Outcome: Progressing   Problem: Pain Managment: Goal: General experience of comfort will improve Outcome: Progressing   Problem: Safety: Goal: Ability to remain free from injury will improve Outcome: Progressing   Problem: Skin Integrity: Goal: Risk for impaired skin integrity will decrease Outcome: Progressing   Problem: Education: Goal: Knowledge of the prescribed therapeutic regimen will improve Outcome: Progressing Goal: Understanding of discharge needs will improve Outcome: Progressing   Problem: Activity: Goal: Ability to avoid complications of mobility impairment will improve Outcome: Progressing Goal: Ability to tolerate increased activity will improve Outcome: Progressing   Problem: Clinical Measurements: Goal: Postoperative complications will be avoided or minimized Outcome: Progressing   Problem: Pain Management: Goal:  Pain level will decrease with appropriate interventions Outcome: Progressing

## 2022-01-29 NOTE — Progress Notes (Signed)
PATIENT ID: MONTFORD BARG  MRN: [id number]  DOB/AGE:  September 04, 1945 / 76 y.o.  1 Day Post-Op Procedure(s) (LRB): RIGHT TOTAL HIP ARTHROPLASTY POSTERIOR, REMOVAL OF K NAIL (Right)    PROGRESS NOTE Subjective: Patient is alert, oriented, no Nausea, no Vomiting, yes passing gas, . Taking PO well. Denies SOB, Chest or Calf Pain. Using Incentive Spirometer, PAS in place. Ambulate 3' Patient reports pain as  3/10  .    Objective: Vital signs in last 24 hours: Vitals:   01/28/22 1539 01/28/22 2042 01/29/22 0200 01/29/22 0602  BP: (!) 137/93 (!) 144/85 136/79 120/72  Pulse: (!) 107 82 72 77  Resp:  '16 18 18  '$ Temp: 98.3 F (36.8 C) (!) 97.3 F (36.3 C) 98.4 F (36.9 C) 98.7 F (37.1 C)  TempSrc: Oral Oral Oral Oral  SpO2: 98% 94% 95% 96%  Weight:      Height:          Intake/Output from previous day: I/O last 3 completed shifts: In: 5654.7 [P.O.:960; I.V.:4094.7; IV Piggyback:600] Out: 2875 [XYBFX:8329; Blood:900]   Intake/Output this shift: No intake/output data recorded.   LABORATORY DATA: No results for input(s): "WBC", "HGB", "HCT", "PLT", "NA", "K", "CL", "CO2", "BUN", "CREATININE", "GLUCOSE", "GLUCAP", "INR", "CALCIUM" in the last 72 hours.  Invalid input(s): "PT", "2"  Examination: Neurologically intact ABD soft Neurovascular intact Sensation intact distally Intact pulses distally Dorsiflexion/Plantar flexion intact Incision: dressing C/D/I No cellulitis present Compartment soft} XR AP&Lat of hip shows well placed\fixed THA  Assessment:   1 Day Post-Op Procedure(s) (LRB): RIGHT TOTAL HIP ARTHROPLASTY POSTERIOR, REMOVAL OF K NAIL (Right) ADDITIONAL DIAGNOSIS:  Expected Acute Blood Loss Anemia, OSA, HTN, CHF, BPH, Anticipated LOS equal to or greater than 2 midnights due to Inpatient only procedure   Plan: PT/OT WBAT, THA  DVT Prophylaxis: SCDx72 hrs, ASA 81 mg BID x 2 weeks  DISCHARGE PLAN: Home, Today for passes physical therapy.  DISCHARGE NEEDS: HHPT,  Walker, and 3-in-1 comode seat Patient ID: SOTA HETZ, male   DOB: May 07, 1946, 76 y.o.   MRN: [id number]

## 2022-01-30 ENCOUNTER — Other Ambulatory Visit: Payer: Self-pay | Admitting: Orthopedic Surgery

## 2022-01-30 NOTE — Plan of Care (Signed)
  Problem: Activity: Goal: Risk for activity intolerance will decrease Outcome: Progressing   Problem: Nutrition: Goal: Adequate nutrition will be maintained Outcome: Adequate for Discharge   Problem: Elimination: Goal: Will not experience complications related to urinary retention Outcome: Adequate for Discharge

## 2022-01-30 NOTE — TOC Progression Note (Signed)
Transition of Care [facility]) - Progression Note   Patient Details  Name: Timothy Sheppard MRN: [id number] Date of Birth: 1945/12/11  Transition of Care [facility]) CM/SW [place], LCSW Phone Number: 01/30/2022, 9:23 AM  Clinical Narrative: CSW notified patient wants a 3N1. CSW spoke with patient and patient is requesting a bariatric 3N1, but patient does not meet criteria for one at this time. Patient aware he will have to private pay for one of that size and it was recommended that he ask family to assist with finding one in a local DME store.  Barriers to Discharge: No Barriers Identified  Expected Discharge Plan and Services Expected Discharge Date: 01/30/22               DME Arranged: N/A DME Agency: NA  Readmission Risk Interventions     No data to display

## 2022-01-30 NOTE — Progress Notes (Signed)
PATIENT ID: Timothy Sheppard  MRN: [id number]  DOB/AGE:  1946/03/29 / 76 y.o.  2 Days Post-Op Procedure(s) (LRB): RIGHT TOTAL HIP ARTHROPLASTY POSTERIOR, REMOVAL OF K NAIL (Right)    PROGRESS NOTE Subjective: Patient is alert, oriented, no Nausea, no Vomiting, yes passing gas, . Taking PO well. Denies SOB, Chest or Calf Pain. Using Incentive Spirometer, PAS in place. Ambulate WBAT with pt walking 40 ft with therapy Patient reports pain as  4/10  .    Objective: Vital signs in last 24 hours: Vitals:   01/29/22 1347 01/29/22 2057 01/30/22 0132 01/30/22 0527  BP: (!) 152/84 131/68 139/73 135/74  Pulse: 87 95 95 82  Resp: '16 18 18 18  '$ Temp: 99.3 F (37.4 C) 99.6 F (37.6 C) 98 F (36.7 C) 98.6 F (37 C)  TempSrc: Oral Oral Oral Oral  SpO2: 94% 97% 95% 96%  Weight:      Height:          Intake/Output from previous day: I/O last 3 completed shifts: In: 2827.1 [P.O.:1080; I.V.:1747.1] Out: 3300 [Urine:3300]   Intake/Output this shift: No intake/output data recorded.   LABORATORY DATA: No results for input(s): "WBC", "HGB", "HCT", "PLT", "NA", "K", "CL", "CO2", "BUN", "CREATININE", "GLUCOSE", "GLUCAP", "INR", "CALCIUM" in the last 72 hours.  Invalid input(s): "PT", "2"  Examination: Neurologically intact Neurovascular intact Sensation intact distally Intact pulses distally Dorsiflexion/Plantar flexion intact Incision: dressing C/D/I and no drainage No cellulitis present Compartment soft} XR AP&Lat of hip shows well placed\fixed THA  Assessment:   2 Days Post-Op Procedure(s) (LRB): RIGHT TOTAL HIP ARTHROPLASTY POSTERIOR, REMOVAL OF K NAIL (Right) ADDITIONAL DIAGNOSIS:  Expected Acute Blood Loss Anemia, OSA, HTN, CHF, BPH,  Plan: PT/OT WBAT, THA posterior hip precautions  DVT Prophylaxis: SCDx72 hrs, ASA 81 mg BID x 2 weeks  DISCHARGE PLAN: Home, when pt passes therapy goals  DISCHARGE NEEDS: HHPT, Walker, and 3-in-1 comode seat

## 2022-01-30 NOTE — Discharge Summary (Signed)
Patient ID: Timothy Sheppard MRN: [id number] DOB/AGE: [birth year] 74 y.o.  Admit date: 01/28/2022 Discharge date: 01/30/2022  Admission Diagnoses:  Principal Problem:   Osteoarthritis of right hip Active Problems:   H/O total hip arthroplasty, right   Discharge Diagnoses:  Same  Past Medical History:  Diagnosis Date   Acute pain of left shoulder 08/29/2020   Allergic rhinitis 08/25/2015   Arthritis    Basal cell carcinoma 05/13/2019   Drosum nose supratip - ED&C    BPH (benign prostatic hyperplasia) 07/19/2013   Mildly uniformly enlarged on exam 07/19/13  Lab Results  Component Value Date   PSA 0.30 07/19/2013      Chest pain 04/25/2013   CHF (congestive heart failure) ([place])    pt denies   Chronic hip pain    Right   Chronic pain of right lower extremity 11/27/2017   Contact dermatitis due to plant 01/23/2018   Erectile dysfunction    Fatigue 09/04/2018   History of kidney stones    HTN (hypertension)    Hyperlipidemia    Hypertension    Iritis    Iron deficiency anemia 09/28/2021   Neuropathy    Palpitations 05/03/2013   Poison ivy dermatitis 12/30/2019   Prediabetes 06/11/2018   Preoperative clearance 12/21/2021   Preventative health care 06/11/2018   Sleep apnea     Surgeries: Procedure(s): RIGHT TOTAL HIP ARTHROPLASTY POSTERIOR, REMOVAL OF K NAIL on 01/28/2022   Consultants:   Discharged Condition: Improved  Hospital Course: Timothy Sheppard is an 76 y.o. male who was admitted 01/28/2022 for operative treatment ofOsteoarthritis of right hip. Patient has severe unremitting pain that affects sleep, daily activities, and work/hobbies. After pre-op clearance the patient was taken to the operating room on 01/28/2022 and underwent  Procedure(s): RIGHT TOTAL HIP ARTHROPLASTY POSTERIOR, REMOVAL OF K NAIL.    Patient was given perioperative antibiotics:  Anti-infectives (From admission, onward)    Start     Dose/Rate Route Frequency Ordered Stop   01/28/22 0600  ceFAZolin  (ANCEF) IVPB 2g/100 mL premix        2 g 200 mL/hr over 30 Minutes Intravenous On call to O.R. 01/28/22 0533 01/28/22 0747        Patient was given sequential compression devices, early ambulation, and chemoprophylaxis to prevent DVT.  Patient benefited maximally from hospital stay and there were no complications.    Recent vital signs: Patient Vitals for the past 24 hrs:  BP Temp Temp src Pulse Resp SpO2  01/30/22 0527 135/74 98.6 F (37 C) Oral 82 18 96 %  01/30/22 0132 139/73 98 F (36.7 C) Oral 95 18 95 %  01/29/22 2057 131/68 99.6 F (37.6 C) Oral 95 18 97 %  01/29/22 1347 (!) 152/84 99.3 F (37.4 C) Oral 87 16 94 %  01/29/22 1000 110/67 -- -- -- -- 97 %     Recent laboratory studies: No results for input(s): "WBC", "HGB", "HCT", "PLT", "NA", "K", "CL", "CO2", "BUN", "CREATININE", "GLUCOSE", "INR", "CALCIUM" in the last 72 hours.  Invalid input(s): "PT", "2"   Discharge Medications:   Allergies as of 01/30/2022   No Known Allergies      Medication List     STOP taking these medications    acetaminophen 500 MG tablet Commonly known as: TYLENOL   aspirin 81 MG tablet Replaced by: aspirin EC 81 MG tablet   BC FAST PAIN RELIEF PO   ibuprofen 200 MG tablet Commonly known as: ADVIL   traMADol 50 MG  tablet Commonly known as: ULTRAM       TAKE these medications    aspirin EC 81 MG tablet Take 1 tablet (81 mg total) by mouth 2 (two) times daily. Replaces: aspirin 81 MG tablet   diclofenac Sodium 1 % Gel Commonly known as: Voltaren Apply 2 g topically 3 (three) times daily as needed. For pain.   ferrous sulfate 325 (65 FE) MG tablet Take 650 mg by mouth daily with breakfast.   fluticasone 50 MCG/ACT nasal spray Commonly known as: FLONASE Place 1 spray into both nostrils daily as needed for allergies or rhinitis.   furosemide 20 MG tablet Commonly known as: LASIX TAKE 2 TABLETS (40 MG TOTAL) BY MOUTH DAILY. FOR LEG SWELLING.   gabapentin 300 MG  capsule Commonly known as: NEURONTIN TAKE 1 CAPSULE IN THE MORNING AND 3 CAPS AT BEDTIME FOR PAIN   oxyCODONE-acetaminophen 5-325 MG tablet Commonly known as: PERCOCET/ROXICET Take 1 tablet by mouth every 4 (four) hours as needed for severe pain.   sildenafil 100 MG tablet Commonly known as: VIAGRA TAKE 1 TABLET BY MOUTH 30 MINUTES PRIOR TO INTERCOURSE AS NEEDED   tamsulosin 0.4 MG Caps capsule Commonly known as: FLOMAX TAKE 1 CAPSULE BY MOUTH IN THE MORNING FOR  URINE  STREAM   tiZANidine 2 MG tablet Commonly known as: ZANAFLEX Take 1 tablet (2 mg total) by mouth every 6 (six) hours as needed.               Durable Medical Equipment  (From admission, onward)           Start     Ordered   01/28/22 1211  DME Walker rolling  Once       Question:  Patient needs a walker to treat with the following condition  Answer:  Status post right hip replacement   01/28/22 1210   01/28/22 1211  DME 3 n 1  Once        01/28/22 1210              Discharge Care Instructions  (From admission, onward)           Start     Ordered   01/30/22 0000  Weight bearing as tolerated        01/30/22 0821   01/29/22 0000  Change dressing       Comments: Change dressing Only if drainage exceeds 40% of window on dressing   01/29/22 0753            Diagnostic Studies: DG Hip Port Unilat With Pelvis 1V Right  Result Date: 01/28/2022 CLINICAL DATA:  History of right total hip arthroplasty EXAM: DG HIP (WITH OR WITHOUT PELVIS) 1V PORT RIGHT COMPARISON:  Nov 28, 2017 hip radiograph FINDINGS: Prior right total hip arthroplasty. Hardware is intact with no evidence of perihardware fracture. Chronic osseous deformity of the mid right femur, similar to prior exam. Soft tissues are unremarkable. IMPRESSION: Prior right total hip arthroplasty, hardware is intact with no evidence of perihardware fracture. Electronically Signed   By: Yetta Glassman M.D.   On: 01/28/2022 11:16   MYOCARDIAL  PERFUSION IMAGING  Result Date: 01/25/2022   Findings are consistent with no prior ischemia. The study is low risk.   No ST deviation was noted.   Left ventricular function is normal. Nuclear stress EF: 55 %. The left ventricular ejection fraction is normal (55-65%). End diastolic cavity size is mildly enlarged. End systolic cavity size is mildly enlarged.  Prior study available for comparison from 07/19/2014. No changes compared to prior study. Prior study was read as normal with 51% EF. No reversible ischemia. Mildly dilated LV with normal LVEF of 55%. This is a low risk study. Compared to a prior study in 2015, the LVEF is higher.   DG Chest 2 View  Result Date: 01/17/2022 CLINICAL DATA:  Pre op evaluation. EXAM: CHEST - 2 VIEW COMPARISON:  Chest radiograph 04/25/2013 FINDINGS: Multiple old left rib fractures. Large retrocardiac opacity most likely represents a large hiatal hernia. Again noted is elevation of the right hemidiaphragm. Old fracture of the right fifth rib. Heart size is within normal limits. No airspace disease or pulmonary edema. Old left clavicle fracture. No large pleural effusions. IMPRESSION: 1. No acute cardiopulmonary disease. 2. Large retrocardiac opacity most likely represents a large hiatal hernia. 3. Old rib fractures and old left clavicle fracture. Electronically Signed   By: Markus Daft M.D.   On: 01/17/2022 20:14    Disposition: Discharge disposition: 01-Home or Self Care       Discharge Instructions     Call MD / Call 911   Complete by: As directed    If you experience chest pain or shortness of breath, CALL 911 and be transported to the hospital emergency room.  If you develope a fever above 101 F, pus (white drainage) or increased drainage or redness at the wound, or calf pain, call your surgeon's office.   Call MD / Call 911   Complete by: As directed    If you experience chest pain or shortness of breath, CALL 911 and be transported to the hospital emergency  room.  If you develope a fever above 101 F, pus (white drainage) or increased drainage or redness at the wound, or calf pain, call your surgeon's office.   Change dressing   Complete by: As directed    Change dressing Only if drainage exceeds 40% of window on dressing   Constipation Prevention   Complete by: As directed    Drink plenty of fluids.  Prune juice may be helpful.  You may use a stool softener, such as Colace (over the counter) 100 mg twice a day.  Use MiraLax (over the counter) for constipation as needed.   Constipation Prevention   Complete by: As directed    Drink plenty of fluids.  Prune juice may be helpful.  You may use a stool softener, such as Colace (over the counter) 100 mg twice a day.  Use MiraLax (over the counter) for constipation as needed.   Diet - low sodium heart healthy   Complete by: As directed    Driving restrictions   Complete by: As directed    No driving for 2 weeks   Increase activity slowly as tolerated   Complete by: As directed    Increase activity slowly as tolerated   Complete by: As directed    Patient may shower   Complete by: As directed    You may shower without a dressing once there is no drainage.  Do not wash over the wound.  If drainage remains, cover wound with plastic wrap and then shower.   Post-operative opioid taper instructions:   Complete by: As directed    POST-OPERATIVE OPIOID TAPER INSTRUCTIONS: It is important to wean off of your opioid medication as soon as possible. If you do not need pain medication after your surgery it is ok to stop day one. Opioids include: Codeine, Hydrocodone(Norco, Vicodin), Oxycodone(Percocet, oxycontin)  and hydromorphone amongst others.  Long term and even short term use of opiods can cause: Increased pain response Dependence Constipation Depression Respiratory depression And more.  Withdrawal symptoms can include Flu like symptoms Nausea, vomiting And more Techniques to manage these  symptoms Hydrate well Eat regular healthy meals Stay active Use relaxation techniques(deep breathing, meditating, yoga) Do Not substitute Alcohol to help with tapering If you have been on opioids for less than two weeks and do not have pain than it is ok to stop all together.  Plan to wean off of opioids This plan should start within one week post op of your joint replacement. Maintain the same interval or time between taking each dose and first decrease the dose.  Cut the total daily intake of opioids by one tablet each day Next start to increase the time between doses. The last dose that should be eliminated is the evening dose.      Post-operative opioid taper instructions:   Complete by: As directed    POST-OPERATIVE OPIOID TAPER INSTRUCTIONS: It is important to wean off of your opioid medication as soon as possible. If you do not need pain medication after your surgery it is ok to stop day one. Opioids include: Codeine, Hydrocodone(Norco, Vicodin), Oxycodone(Percocet, oxycontin) and hydromorphone amongst others.  Long term and even short term use of opiods can cause: Increased pain response Dependence Constipation Depression Respiratory depression And more.  Withdrawal symptoms can include Flu like symptoms Nausea, vomiting And more Techniques to manage these symptoms Hydrate well Eat regular healthy meals Stay active Use relaxation techniques(deep breathing, meditating, yoga) Do Not substitute Alcohol to help with tapering If you have been on opioids for less than two weeks and do not have pain than it is ok to stop all together.  Plan to wean off of opioids This plan should start within one week post op of your joint replacement. Maintain the same interval or time between taking each dose and first decrease the dose.  Cut the total daily intake of opioids by one tablet each day Next start to increase the time between doses. The last dose that should be eliminated is  the evening dose.      Weight bearing as tolerated   Complete by: As directed         Follow-up Information     Frederik Pear, MD. Go on 02/07/2022.   Specialty: Orthopedic Surgery Why: Your appointment is at 9:45 Contact information: [place] [id number] [phone]         [facility]. Go on 01/30/2022.   Why: Your outpatient physical therapy appointment is at 9:45 Contact information: [phone]                 Signed: Joanell Rising 01/30/2022, 8:21 AM

## 2022-01-30 NOTE — Progress Notes (Signed)
Physical Therapy Treatment Patient Details Name: Timothy Sheppard MRN: [id number] DOB: [birth year] Today's Date: 01/30/2022   History of Present Illness Pt is a 76yo male presenting s/p R-THA, posterior approach after IM nail removal on 01/28/22.  PMH: CHF, HTN, HLD, neuropathy, R-femur ORIF 1967, back surgery 2000    PT Comments    Pt seen POD2, reporting 3/10 supine in bed prior to mobility, expressing desire to go home today. Pt verbalized appropriate posterior hip precautions via teachback method and demonstrated ability to maintain hip in safe positioning during all mobility tasks today. Required supervision for bed mobility with reminders to utilize logroll; min guard for transfers, min guard for ambulation in hallway with RW 173f progressed to supervision by end of ambulation task. Reviewed exercises, car transfers, cryotherapy, and walking program. All questions answered and education completed. Pt has met mobility goals for safe discharge home, PT is signing off, if needs change please reconsult. Thank you for the referral.     Recommendations for follow up therapy are one component of a multi-disciplinary discharge planning process, led by the attending physician.  Recommendations may be updated based on patient status, additional functional criteria and insurance authorization.  Follow Up Recommendations  Follow physician's recommendations for discharge plan and follow up therapies     Assistance Recommended at Discharge Intermittent Supervision/Assistance  Patient can return home with the following A little help with walking and/or transfers;A little help with bathing/dressing/bathroom;Assistance with cooking/housework;Assist for transportation;Help with stairs or ramp for entrance   Equipment Recommendations  None recommended by PT    Recommendations for Other Services       Precautions / Restrictions Precautions Precautions: Posterior Hip Precaution Booklet Issued: Yes  (comment) Precaution Comments: Reviewed posterior precautions via teach back method Restrictions Weight Bearing Restrictions: No Other Position/Activity Restrictions: WBAT     Mobility  Bed Mobility Overal bed mobility: Needs Assistance Bed Mobility: Supine to Sit     Supine to sit: Supervision     General bed mobility comments: for safety only, encouraged logroll method to maintain hip in proper positioning    Transfers Overall transfer level: Needs assistance Equipment used: Rolling walker (2 wheels) Transfers: Sit to/from Stand Sit to Stand: Min guard, From elevated surface           General transfer comment: Min guard for safety only from elevated surface, no physical assist required, VCs to observe posterior precautions and to power up from bed using upper extremity.    Ambulation/Gait Ambulation/Gait assistance: Min guard, +2 safety/equipment, Supervision Gait Distance (Feet): 180 Feet Assistive device: Rolling walker (2 wheels) Gait Pattern/deviations: Step-to pattern, Decreased stance time - right, Decreased dorsiflexion - right, Decreased weight shift to right, Trunk flexed Gait velocity: decreased     General Gait Details: Pt ambulated 1829fwith RW and min guard assist progressed to supevision, no physical assist required or overt LOB noted., +2 for recliner follow for safety only. Demonstrated improved gait pattern and increased activity tolerance.   Stairs             Wheelchair Mobility    Modified Rankin (Stroke Patients Only)       Balance Overall balance assessment: Needs assistance Sitting-balance support: Feet supported, No upper extremity supported Sitting balance-Leahy Scale: Good     Standing balance support: Reliant on assistive device for balance, During functional activity, Bilateral upper extremity supported Standing balance-Leahy Scale: Poor  Cognition Arousal/Alertness:  Awake/alert Behavior During Therapy: WFL for tasks assessed/performed Overall Cognitive Status: Within Functional Limits for tasks assessed                                          Exercises      General Comments        Pertinent Vitals/Pain Pain Assessment Pain Assessment: 0-10 Pain Score: 5  Pain Location: right hip Pain Descriptors / Indicators: Operative site guarding, Discomfort Pain Intervention(s): Limited activity within patient's tolerance, Monitored during session, Repositioned, Ice applied    Home Living                          Prior Function            PT Goals (current goals can now be found in the care plan section) Acute Rehab PT Goals Patient Stated Goal: Mowing the yard PT Goal Formulation: With patient Time For Goal Achievement: 02/04/22 Potential to Achieve Goals: Good Progress towards PT goals: Progressing toward goals    Frequency    7X/week      PT Plan Current plan remains appropriate    Co-evaluation              AM-PAC PT "6 Clicks" Mobility   Outcome Measure  Help needed turning from your back to your side while in a flat bed without using bedrails?: None Help needed moving from lying on your back to sitting on the side of a flat bed without using bedrails?: A Little Help needed moving to and from a bed to a chair (including a wheelchair)?: A Little Help needed standing up from a chair using your arms (e.g., wheelchair or bedside chair)?: A Little Help needed to walk in hospital room?: A Little Help needed climbing 3-5 steps with a railing? : A Little 6 Click Score: 19    End of Session Equipment Utilized During Treatment: Gait belt Activity Tolerance: Patient tolerated treatment well Patient left: in chair;with call bell/phone within reach;with chair alarm set Nurse Communication: Mobility status PT Visit Diagnosis: Pain;Difficulty in walking, not elsewhere classified (R26.2) Pain -  Right/Left: Right Pain - part of body: Hip     Time: 4235-3614 PT Time Calculation (min) (ACUTE ONLY): 21 min  Charges:  $Gait Training: 8-22 mins                     Coolidge Breeze, PT, DPT [facility] Office: [phone] Pager: [phone]   Coolidge Breeze 01/30/2022, 11:14 AM

## 2022-01-30 NOTE — Plan of Care (Signed)
  Problem: Education: Goal: Knowledge of General Education information will improve Description: Including pain rating scale, medication(s)/side effects and non-pharmacologic comfort measures Outcome: Adequate for Discharge   Problem: Health Behavior/Discharge Planning: Goal: Ability to manage health-related needs will improve Outcome: Adequate for Discharge   Problem: Clinical Measurements: Goal: Ability to maintain clinical measurements within normal limits will improve Outcome: Adequate for Discharge Goal: Will remain free from infection Outcome: Adequate for Discharge Goal: Diagnostic test results will improve Outcome: Adequate for Discharge Goal: Respiratory complications will improve Outcome: Adequate for Discharge Goal: Cardiovascular complication will be avoided Outcome: Adequate for Discharge   Problem: Activity: Goal: Risk for activity intolerance will decrease Outcome: Adequate for Discharge   Problem: Nutrition: Goal: Adequate nutrition will be maintained Outcome: Adequate for Discharge   Problem: Coping: Goal: Level of anxiety will decrease Outcome: Adequate for Discharge   Problem: Elimination: Goal: Will not experience complications related to bowel motility Outcome: Adequate for Discharge Goal: Will not experience complications related to urinary retention Outcome: Adequate for Discharge   Problem: Pain Managment: Goal: General experience of comfort will improve Outcome: Adequate for Discharge   Problem: Safety: Goal: Ability to remain free from injury will improve Outcome: Adequate for Discharge   Problem: Skin Integrity: Goal: Risk for impaired skin integrity will decrease Outcome: Adequate for Discharge   Problem: Education: Goal: Knowledge of the prescribed therapeutic regimen will improve Outcome: Adequate for Discharge Goal: Understanding of discharge needs will improve Outcome: Adequate for Discharge Goal: Individualized Educational  Video(s) Outcome: Adequate for Discharge   Problem: Activity: Goal: Ability to avoid complications of mobility impairment will improve Outcome: Adequate for Discharge Goal: Ability to tolerate increased activity will improve Outcome: Adequate for Discharge   Problem: Clinical Measurements: Goal: Postoperative complications will be avoided or minimized Outcome: Adequate for Discharge   Problem: Pain Management: Goal: Pain level will decrease with appropriate interventions Outcome: Adequate for Discharge

## 2022-02-05 ENCOUNTER — Other Ambulatory Visit: Payer: Medicare Other

## 2022-02-05 DIAGNOSIS — Z9889 Other specified postprocedural states: Secondary | ICD-10-CM | POA: Diagnosis not present

## 2022-02-05 DIAGNOSIS — Z96641 Presence of right artificial hip joint: Secondary | ICD-10-CM | POA: Diagnosis not present

## 2022-02-06 DIAGNOSIS — M25551 Pain in right hip: Secondary | ICD-10-CM | POA: Diagnosis not present

## 2022-02-13 DIAGNOSIS — M25551 Pain in right hip: Secondary | ICD-10-CM | POA: Diagnosis not present

## 2022-02-14 ENCOUNTER — Other Ambulatory Visit: Payer: Self-pay | Admitting: Primary Care

## 2022-02-14 DIAGNOSIS — N401 Enlarged prostate with lower urinary tract symptoms: Secondary | ICD-10-CM

## 2022-02-20 DIAGNOSIS — M25551 Pain in right hip: Secondary | ICD-10-CM | POA: Diagnosis not present

## 2022-02-22 DIAGNOSIS — M25551 Pain in right hip: Secondary | ICD-10-CM | POA: Diagnosis not present

## 2022-02-24 ENCOUNTER — Other Ambulatory Visit: Payer: Self-pay | Admitting: Primary Care

## 2022-02-24 DIAGNOSIS — N529 Male erectile dysfunction, unspecified: Secondary | ICD-10-CM

## 2022-02-25 DIAGNOSIS — M25551 Pain in right hip: Secondary | ICD-10-CM | POA: Diagnosis not present

## 2022-03-06 DIAGNOSIS — M79672 Pain in left foot: Secondary | ICD-10-CM | POA: Diagnosis not present

## 2022-03-06 DIAGNOSIS — M79671 Pain in right foot: Secondary | ICD-10-CM | POA: Diagnosis not present

## 2022-03-14 DIAGNOSIS — M5451 Vertebrogenic low back pain: Secondary | ICD-10-CM | POA: Diagnosis not present

## 2022-09-19 IMAGING — CT CT CERVICAL SPINE W/O CM
3 of 4 series · 12 of 33 positions shown, 14 images · non-contrast
Comparison: None.

CLINICAL DATA: Multiple falls, head injury

EXAM:
CT HEAD WITHOUT CONTRAST
CT CERVICAL SPINE WITHOUT CONTRAST
TECHNIQUE: Multidetector CT imaging of the head and cervical spine was
performed following the standard protocol without intravenous
contrast. Multiplanar CT image reconstructions of the cervical spine
were also generated.

[Series 4: sagittal bone · sagittal · 0.33mm/px · 5 of 63 slices shown, 6 images]
[im 21/63  bone]
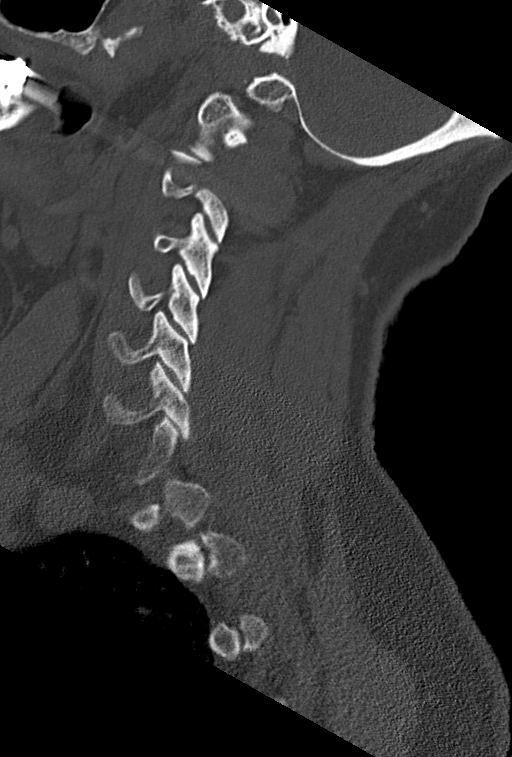
[im 26/63  bone]
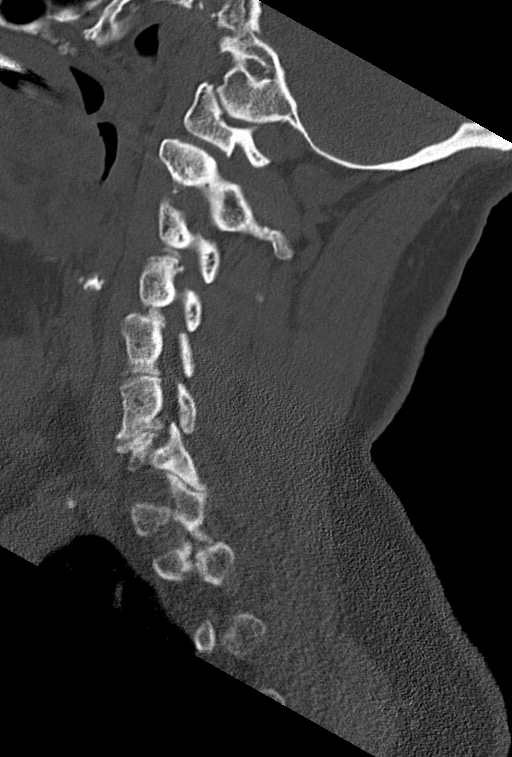
[im 32/63  soft-tissue]
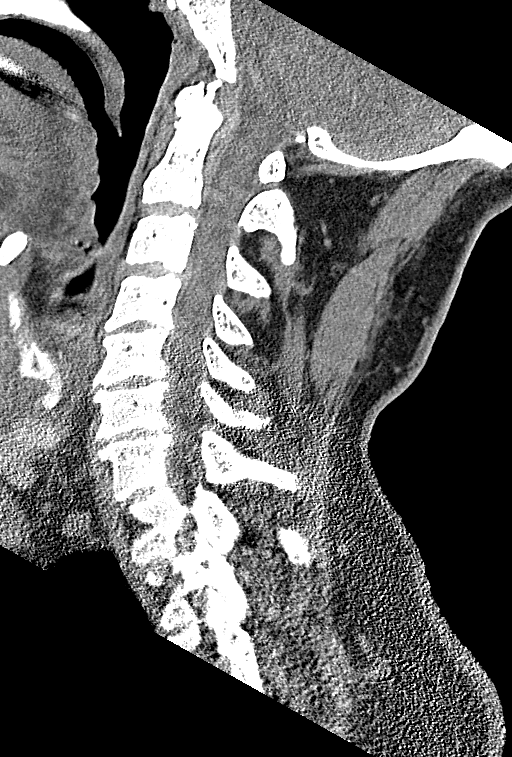
[im 32/63  bone]
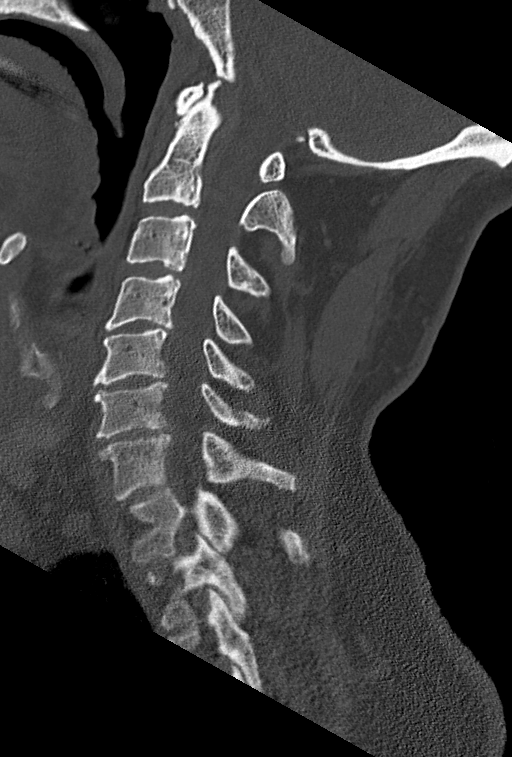
[im 37/63  bone]
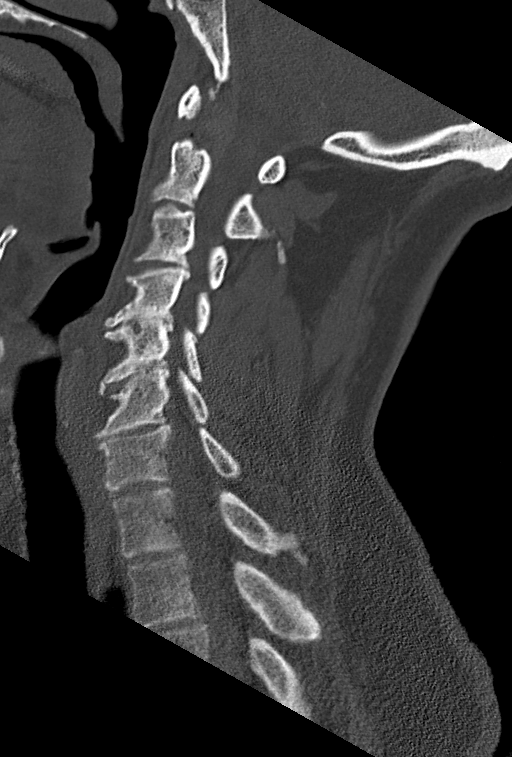
[im 42/63  bone]
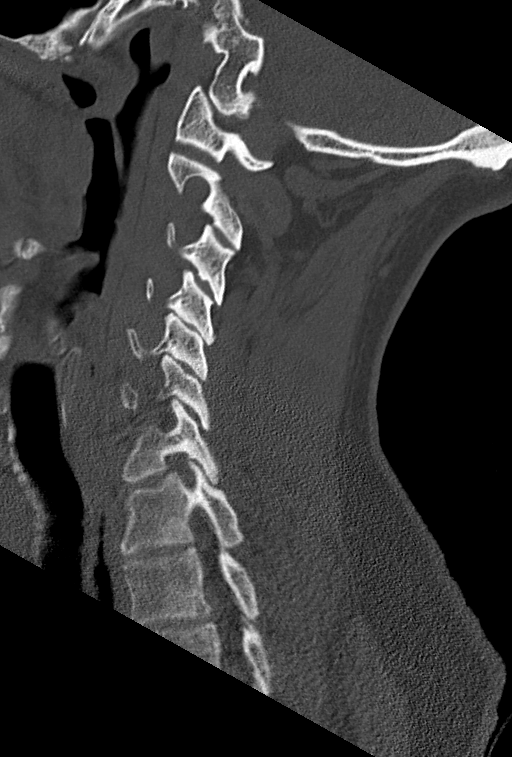

[Series 5: coronal bone · coronal · 0.33mm/px · 3 of 65 slices shown]
[im 15/65  bone]
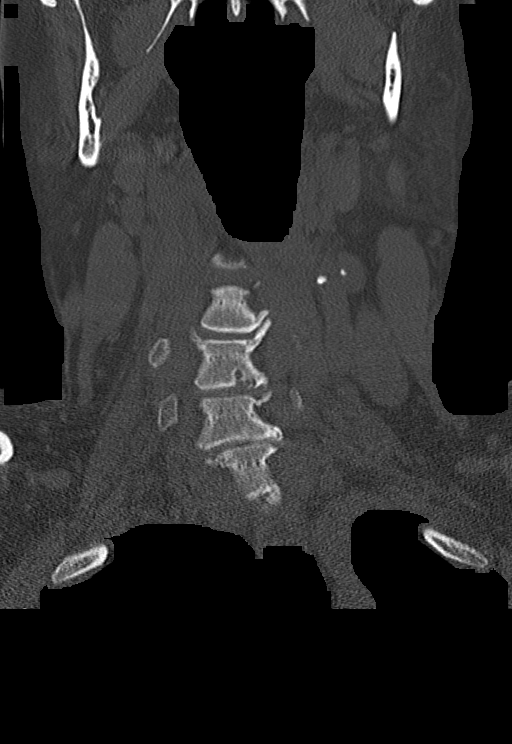
[im 27/65  bone]
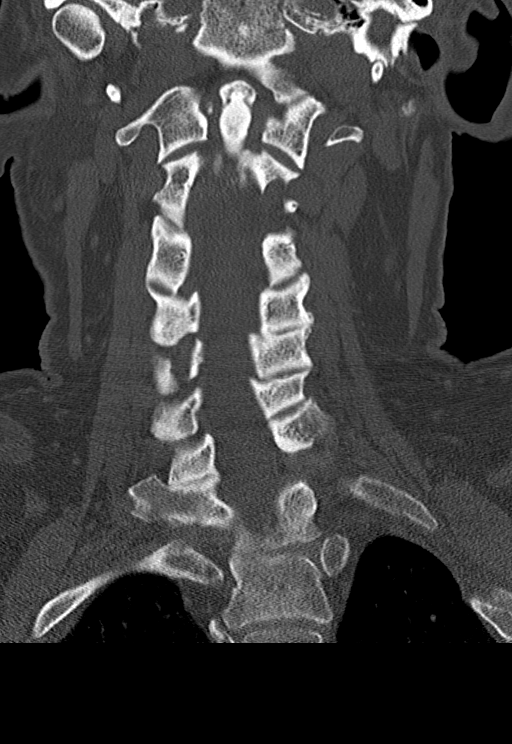
[im 38/65  bone]
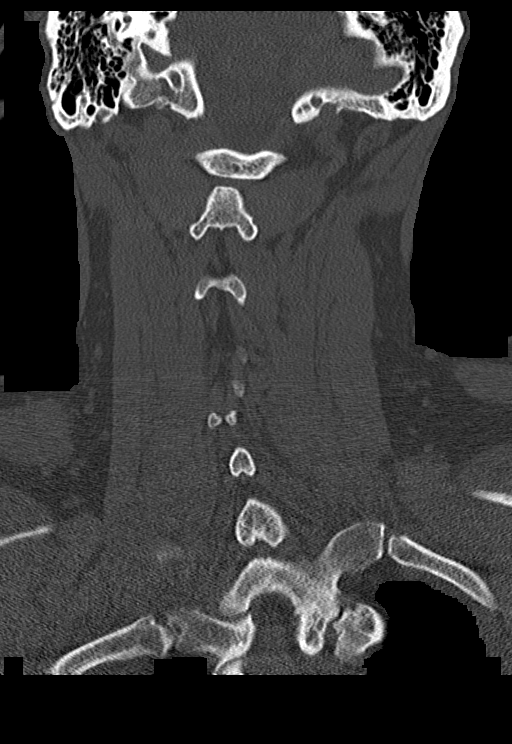

[Series 6: orthogonal bone · axial · 0.23mm/px · z∈[+890,+1046]mm · 4 of 123 slices shown, 5 images]
[im 18/123  soft-tissue]
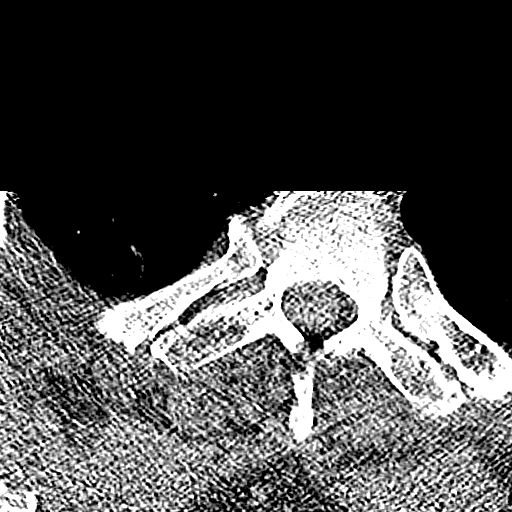
[im 18/123  bone]
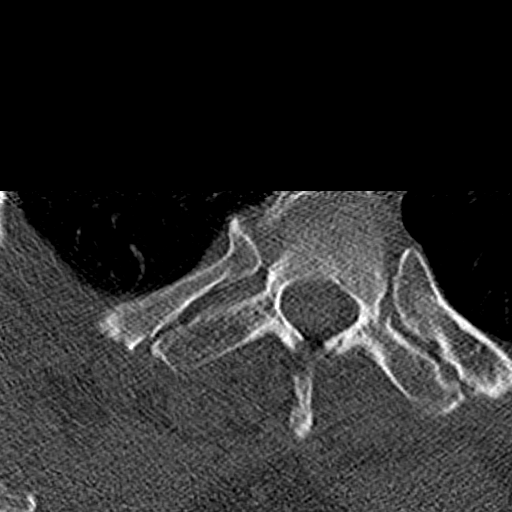
[im 53/123  bone]
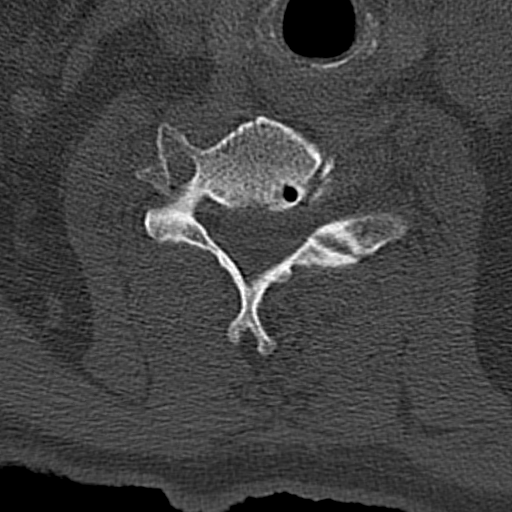
[im 70/123  bone]
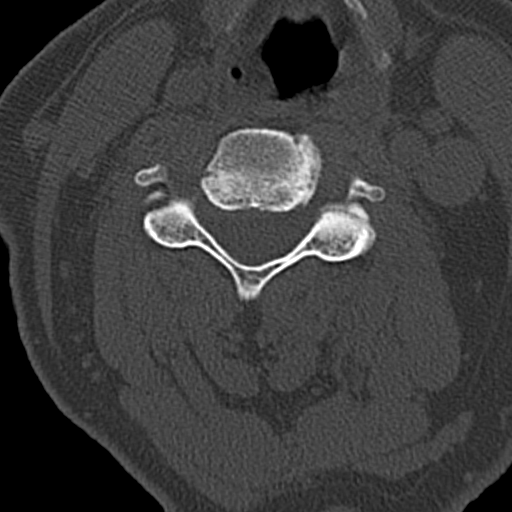
[im 105/123  bone]
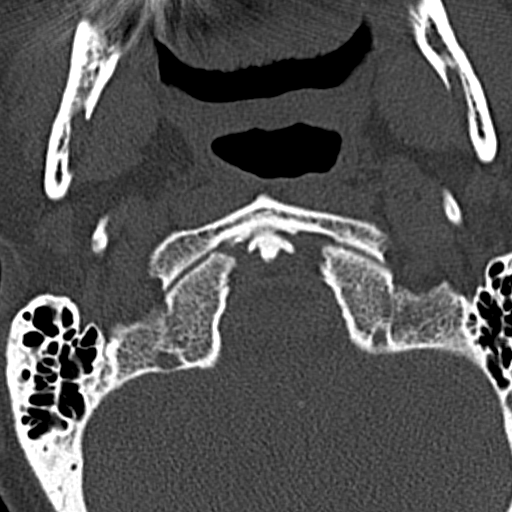

[12 of 33 positions shown; findings below may reference images not displayed]

FINDINGS: CT HEAD FINDINGS

Brain: Normal anatomic configuration. No abnormal intra or
extra-axial mass lesion or fluid collection. No abnormal mass effect
or midline shift. No evidence of acute intracranial hemorrhage or
infarct. Ventricular size is normal. Cerebellum unremarkable.

Vascular: Unremarkable

Skull: Intact

Sinuses/Orbits: Paranasal sinuses are clear. Orbits are
unremarkable.

Other: Mastoid air cells and middle ear cavities are clear. There is
soft tissue swelling involving the left parietal scalp at the vertex

CT CERVICAL SPINE FINDINGS

Alignment: Normal.  No listhesis.

Skull base and vertebrae: The craniocervical junction is
unremarkable. The atlantodental interval is normal. No acute
fracture of the cervical spine. Remote inferior endplate fracture of
C5 is noted. No lytic or blastic bone lesion is identified.

Soft tissues and spinal canal: No prevertebral fluid or swelling. No
visible canal hematoma.

Disc levels: Review of the sagittal reformats demonstrates normal
cervical lordosis. Vertebral body height has been preserved. There
is intervertebral disc space narrowing and endplate remodeling of
C4-C7 in keeping with changes of moderate to severe degenerative
disc disease. Small posteriorly oriented disc osteophytes are noted
at these levels as well as at C3-4. The spinal canal is mildly
diffusely narrowed secondary to congenital narrowing of the
pedicles.

Review of the axial images demonstrates multilevel predominantly
uncovertebral arthrosis. This results in moderate neural foraminal
narrowing on the left at C4-5, moderate neural foraminal narrowing
on the left at C5-6, and moderate to severe neural foraminal
narrowing on the right at C6-7. Additionally, posterior disc
herniation at C3-4 abuts the thecal sac with mild resultant
flattening. Posterior disc osteophyte complex at C4-5, C5-6, and
C6-7 mildly narrows the spinal canal, again with some degree of a
flattening of the thecal sac.,

Upper chest: Unremarkable

Other: None significant
IMPRESSION: No acute intracranial injury. No acute calvarial fracture. Mild left
parietal scalp soft tissue swelling at the vertex.

No acute fracture of the cervical spine.

Multilevel degenerative disease resulting in neural foraminal
narrowing of the cervical spine and mild central canal stenosis as
described above.

## 2022-09-27 ENCOUNTER — Other Ambulatory Visit: Payer: Self-pay | Admitting: Primary Care

## 2022-09-27 DIAGNOSIS — G629 Polyneuropathy, unspecified: Secondary | ICD-10-CM

## 2022-09-28 NOTE — Telephone Encounter (Signed)
Patient is due for CPE/follow up, this will be required prior to any further refills.  Please schedule, thank you!   

## 2022-09-30 NOTE — Telephone Encounter (Signed)
Spoke to pt, scheduled cpe for 11/20/22

## 2022-10-03 ENCOUNTER — Other Ambulatory Visit: Payer: Self-pay | Admitting: Primary Care

## 2022-10-03 DIAGNOSIS — N401 Enlarged prostate with lower urinary tract symptoms: Secondary | ICD-10-CM

## 2022-11-12 ENCOUNTER — Telehealth: Payer: Self-pay | Admitting: Primary Care

## 2022-11-12 NOTE — Telephone Encounter (Signed)
Contacted Timothy Sheppard to schedule their annual wellness visit. Appointment made for 12/24/2022.  [facility] Care Guide [facility] AWV TEAM Direct Dial: [phone]

## 2022-11-20 ENCOUNTER — Ambulatory Visit (INDEPENDENT_AMBULATORY_CARE_PROVIDER_SITE_OTHER): Payer: Medicare Other | Admitting: Primary Care

## 2022-11-20 ENCOUNTER — Encounter: Payer: Self-pay | Admitting: Primary Care

## 2022-11-20 VITALS — BP 124/82 | HR 62 | Temp 97.5°F | Ht 71.0 in | Wt 249.0 lb

## 2022-11-20 DIAGNOSIS — G629 Polyneuropathy, unspecified: Secondary | ICD-10-CM | POA: Diagnosis not present

## 2022-11-20 DIAGNOSIS — E785 Hyperlipidemia, unspecified: Secondary | ICD-10-CM

## 2022-11-20 DIAGNOSIS — Z96641 Presence of right artificial hip joint: Secondary | ICD-10-CM | POA: Diagnosis not present

## 2022-11-20 DIAGNOSIS — I509 Heart failure, unspecified: Secondary | ICD-10-CM | POA: Diagnosis not present

## 2022-11-20 DIAGNOSIS — I10 Essential (primary) hypertension: Secondary | ICD-10-CM

## 2022-11-20 DIAGNOSIS — N529 Male erectile dysfunction, unspecified: Secondary | ICD-10-CM

## 2022-11-20 DIAGNOSIS — R7303 Prediabetes: Secondary | ICD-10-CM

## 2022-11-20 DIAGNOSIS — Z0000 Encounter for general adult medical examination without abnormal findings: Secondary | ICD-10-CM

## 2022-11-20 DIAGNOSIS — R600 Localized edema: Secondary | ICD-10-CM | POA: Diagnosis not present

## 2022-11-20 DIAGNOSIS — Z125 Encounter for screening for malignant neoplasm of prostate: Secondary | ICD-10-CM | POA: Diagnosis not present

## 2022-11-20 DIAGNOSIS — D509 Iron deficiency anemia, unspecified: Secondary | ICD-10-CM

## 2022-11-20 DIAGNOSIS — G4730 Sleep apnea, unspecified: Secondary | ICD-10-CM | POA: Diagnosis not present

## 2022-11-20 LAB — PSA, MEDICARE: PSA: 0.19 ng/ml (ref 0.10–4.00)

## 2022-11-20 LAB — LIPID PANEL
Cholesterol: 177 mg/dL (ref 0–200)
HDL: 49.7 mg/dL (ref >39.00)
LDL Cholesterol: 113 mg/dL — ABNORMAL HIGH (ref 0–99)
NonHDL: 127.55
Total CHOL/HDL Ratio: 4
Triglycerides: 75 mg/dL (ref 0.0–149.0)
VLDL: 15 mg/dL (ref 0.0–40.0)

## 2022-11-20 LAB — IBC + FERRITIN
Ferritin: 51.5 ng/mL (ref 22.0–322.0)
Iron: 92 ug/dL (ref 42–165)
Saturation Ratios: 35.7 % (ref 20.0–50.0)
TIBC: 257.6 ug/dL (ref 250.0–450.0)
Transferrin: 184 mg/dL — ABNORMAL LOW (ref 212.0–360.0)

## 2022-11-20 LAB — COMPREHENSIVE METABOLIC PANEL
ALT: 6 U/L (ref 0–53)
AST: 13 U/L (ref 0–37)
Albumin: 4.1 g/dL (ref 3.5–5.2)
Alkaline Phosphatase: 48 U/L (ref 39–117)
BUN: 16 mg/dL (ref 6–23)
CO2: 30 mEq/L (ref 19–32)
Calcium: 9.5 mg/dL (ref 8.4–10.5)
Chloride: 100 mEq/L (ref 96–112)
Creatinine, Ser: 1.02 mg/dL (ref 0.40–1.50)
GFR: 72.26 mL/min (ref >60.00)
Glucose, Bld: 94 mg/dL (ref 70–99)
Potassium: 4.3 mEq/L (ref 3.5–5.1)
Sodium: 138 mEq/L (ref 135–145)
Total Bilirubin: 0.8 mg/dL (ref 0.2–1.2)
Total Protein: 7 g/dL (ref 6.0–8.3)

## 2022-11-20 LAB — HEMOGLOBIN A1C: Hgb A1c MFr Bld: 5.3 % (ref 4.6–6.5)

## 2022-11-20 LAB — CBC
HCT: 44.6 % (ref 39.0–52.0)
Hemoglobin: 15.3 g/dL (ref 13.0–17.0)
MCHC: 34.2 g/dL (ref 30.0–36.0)
MCV: 92.5 fl (ref 78.0–100.0)
Platelets: 233 10*3/uL (ref 150.0–400.0)
RBC: 4.82 Mil/uL (ref 4.22–5.81)
RDW: 13.7 % (ref 11.5–15.5)
WBC: 7.6 10*3/uL (ref 4.0–10.5)

## 2022-11-20 MED ORDER — FUROSEMIDE 20 MG PO TABS: 20.0000 mg | ORAL_TABLET | Freq: Every day | ORAL | 0 refills | Status: DC

## 2022-11-20 NOTE — Assessment & Plan Note (Signed)
Doing well post operatively. Continue to monitor.

## 2022-11-20 NOTE — Assessment & Plan Note (Signed)
Repeat iron studies and CBC pending. Continue ferrous sulfate 325 mg daily

## 2022-11-20 NOTE — Assessment & Plan Note (Signed)
Controlled.   Reduce furosemide to 20 mg daily given postural dizziness.

## 2022-11-20 NOTE — Assessment & Plan Note (Signed)
No concerns today. Remain off sildenafil 100 mg.

## 2022-11-20 NOTE — Assessment & Plan Note (Signed)
Repeat lipid panel pending. Not currently on statin therapy.

## 2022-11-20 NOTE — Progress Notes (Signed)
Subjective:    Patient ID: NYQUAN SELBE, male    DOB: 1945-09-07, 77 y.o.   MRN: [id number]  HPI  HANI CAMPUSANO is a very pleasant 77 y.o. male who presents today for complete physical and follow up of chronic conditions.   Immunizations: -Tetanus: Completed in 2015 -Shingles: Never completed -Pneumonia: Completed Prevnar 13 in 2018 and Pneumovax in 2019  Diet: Fair diet. Reducing portion sizes.  Exercise: No regular exercise.  Eye exam: Completed several years ago  Dental exam: Completed several years ago   Colonoscopy: Completed Cologuard in 2023  PSA: Due  BP Readings from Last 3 Encounters:  11/20/22 124/82  01/30/22 135/74  01/23/22 (!) 172/86         Review of Systems  Constitutional:  Negative for unexpected weight change.  HENT:  Negative for rhinorrhea.   Eyes:  Negative for visual disturbance.  Respiratory:  Negative for cough and shortness of breath.   Cardiovascular:  Negative for chest pain.  Gastrointestinal:  Negative for constipation and diarrhea.  Genitourinary:  Negative for difficulty urinating.  Musculoskeletal:  Negative for arthralgias and myalgias.  Skin:  Negative for rash.  Allergic/Immunologic: Negative for environmental allergies.  Neurological:  Positive for dizziness and numbness. Negative for headaches.  Psychiatric/Behavioral:  The patient is not nervous/anxious.          Past Medical History:  Diagnosis Date   Acute pain of left shoulder 08/29/2020   Allergic rhinitis 08/25/2015   Arthritis    Basal cell carcinoma 05/13/2019   Drosum nose supratip - ED&C    BPH (benign prostatic hyperplasia) 07/19/2013   Mildly uniformly enlarged on exam 07/19/13  Lab Results  Component Value Date   PSA 0.30 07/19/2013      Chest pain 04/25/2013   CHF (congestive heart failure) (HCC)    pt denies   Chronic hip pain    Right   Chronic pain of right lower extremity 11/27/2017   Contact dermatitis due to plant 01/23/2018   DOE  (dyspnea on exertion) 01/23/2022   Erectile dysfunction    Fatigue 09/04/2018   History of kidney stones    HTN (hypertension)    Hyperlipidemia    Hypertension    Iritis    Iron deficiency anemia 09/28/2021   Neuropathy    Palpitations 05/03/2013   Poison ivy dermatitis 12/30/2019   Prediabetes 06/11/2018   Preoperative clearance 12/21/2021   Preventative health care 06/11/2018   Sleep apnea     Social History   Socioeconomic History   Marital status: Married    Spouse name: Not on file   Number of children: Not on file   Years of education: Not on file   Highest education level: Not on file  Occupational History   Not on file  Tobacco Use   Smoking status: Former    Types: Pipe   Smokeless tobacco: Former   Tobacco comments:    Quit 1 mo ago  Building services engineer Use: Never used  Substance and Sexual Activity   Alcohol use: No   Drug use: No   Sexual activity: Not on file  Other Topics Concern   Not on file  Social History Narrative   ** Merged History Encounter **       Married. 2 children, 2 grandchildren, 2 great grandchilden. Retired.     Social Determinants of Health   Financial Resource Strain: Low Risk  (12/06/2021)   Overall Financial Resource Strain (CARDIA)  Difficulty of Paying Living Expenses: Not hard at all  Food Insecurity: No Food Insecurity (12/06/2021)   Hunger Vital Sign    Worried About Running Out of Food in the Last Year: Never true    Ran Out of Food in the Last Year: Never true  Transportation Needs: No Transportation Needs (12/06/2021)   PRAPARE - Administrator, Civil Service (Medical): No    Lack of Transportation (Non-Medical): No  Physical Activity: Insufficiently Active (12/06/2021)   Exercise Vital Sign    Days of Exercise per Week: 3 days    Minutes of Exercise per Session: 30 min  Stress: No Stress Concern Present (12/06/2021)   [employer] of Occupational Health - Occupational Stress Questionnaire     Feeling of Stress : Not at all  Social Connections: Socially Integrated (12/06/2021)   Social Connection and Isolation Panel [NHANES]    Frequency of Communication with Friends and Family: More than three times a week    Frequency of Social Gatherings with Friends and Family: More than three times a week    Attends Religious Services: More than 4 times per year    Active Member of [employer] or Organizations: Yes    Attends Engineer, structural: More than 4 times per year    Marital Status: Married  Catering manager Violence: Not At Risk (12/06/2021)   Humiliation, Afraid, Rape, and Kick questionnaire    Fear of Current or Ex-Partner: No    Emotionally Abused: No    Physically Abused: No    Sexually Abused: No    Past Surgical History:  Procedure Laterality Date   BACK SURGERY  2000   HERNIA REPAIR     ORIF FEMUR FRACTURE Right 1967   rod insertion   TONSILLECTOMY AND ADENOIDECTOMY     TOTAL HIP ARTHROPLASTY Right 01/28/2022   Procedure: RIGHT TOTAL HIP ARTHROPLASTY POSTERIOR, REMOVAL OF K NAIL;  Surgeon: Gean Birchwood, MD;  Location: [facility];  Service: Orthopedics;  Laterality: Right;    Family History  Problem Relation Age of Onset   Heart failure Mother    Lupus Mother    Heart failure Father     No Known Allergies  Current Outpatient Medications on File Prior to Visit  Medication Sig Dispense Refill   aspirin EC 81 MG tablet Take 1 tablet (81 mg total) by mouth 2 (two) times daily. 60 tablet 0   diclofenac Sodium (VOLTAREN) 1 % GEL Apply 2 g topically 3 (three) times daily as needed. For pain. 100 g 0   ferrous sulfate 325 (65 FE) MG tablet Take 650 mg by mouth daily with breakfast.     fluticasone (FLONASE) 50 MCG/ACT nasal spray Place 1 spray into both nostrils daily as needed for allergies or rhinitis.     gabapentin (NEURONTIN) 300 MG capsule TAKE 1 CAPSULE IN THE MORNING AND 3 CAPS AT BEDTIME FOR PAIN 360 capsule 0   tamsulosin (FLOMAX) 0.4 MG CAPS capsule TAKE 1  CAPSULE BY MOUTH IN THE MORNING FOR  URINE  STREAM 90 capsule 0   No current facility-administered medications on file prior to visit.    BP 124/82   Pulse 62   Temp (!) 97.5 F (36.4 C) (Temporal)   Ht 5\' 11"  (1.803 m)   Wt 249 lb (112.9 kg)   SpO2 95%   BMI 34.73 kg/m  Objective:   Physical Exam HENT:     Right Ear: Tympanic membrane and ear canal normal.  Left Ear: Tympanic membrane and ear canal normal.     Nose: Nose normal.     Right Sinus: No maxillary sinus tenderness or frontal sinus tenderness.     Left Sinus: No maxillary sinus tenderness or frontal sinus tenderness.  Eyes:     Conjunctiva/sclera: Conjunctivae normal.  Neck:     Thyroid: No thyromegaly.     Vascular: No carotid bruit.  Cardiovascular:     Rate and Rhythm: Normal rate and regular rhythm.     Heart sounds: Normal heart sounds.  Pulmonary:     Effort: Pulmonary effort is normal.     Breath sounds: Normal breath sounds. No wheezing or rales.  Abdominal:     General: Bowel sounds are normal.     Palpations: Abdomen is soft.     Tenderness: There is no abdominal tenderness.  Musculoskeletal:        General: Normal range of motion.     Cervical back: Neck supple.  Skin:    General: Skin is warm and dry.  Neurological:     Mental Status: He is alert and oriented to person, place, and time.     Cranial Nerves: No cranial nerve deficit.     Deep Tendon Reflexes: Reflexes are normal and symmetric.  Psychiatric:        Mood and Affect: Mood normal.           Assessment & Plan:  Preventative health care Assessment & Plan: Immunizations UTD. Discussed Shingrix vaccines.  Colon cancer screening UTD, due in 2026 PSA due and pending.  Discussed the importance of a healthy diet and regular exercise in order for weight loss, and to reduce the risk of further co-morbidity.  Exam stable. Labs pending.  Follow up in 1 year for repeat physical.    Congestive heart failure, unspecified HF  chronicity, unspecified heart failure type [facility]) Assessment & Plan: Appears euvolemic. He does have postural dizziness.   Reduce Lasix to 20 mg daily. CMP pending.   Primary hypertension Assessment & Plan: Controlled.   Reduce furosemide to 20 mg daily given postural dizziness.   Orders: -     Comprehensive metabolic panel  Neuropathy Assessment & Plan: Controlled.  Continue gabapentin 600 mg BID.     Sleep apnea, unspecified type Assessment & Plan: Declines further evaluation.     Prediabetes Assessment & Plan: Commended him on weight loss!  Repeat A1C pending.  Orders: -     Hemoglobin A1c  Iron deficiency anemia, unspecified iron deficiency anemia type Assessment & Plan: Repeat iron studies and CBC pending. Continue ferrous sulfate 325 mg daily  Orders: -     CBC -     IBC + Ferritin  Hyperlipidemia, unspecified hyperlipidemia type Assessment & Plan: Repeat lipid panel pending. Not currently on statin therapy.   Orders: -     Lipid panel  H/O total hip arthroplasty, right Assessment & Plan: Doing well post operatively. Continue to monitor.    Erectile dysfunction, unspecified erectile dysfunction type Assessment & Plan: No concerns today. Remain off sildenafil 100 mg.   Screening for prostate cancer -     PSA, Medicare  Peripheral edema -     Furosemide; Take 1 tablet (20 mg total) by mouth daily. For leg swelling.  Dispense: 90 tablet; Refill: 0        Doreene Nest, NP

## 2022-11-20 NOTE — Assessment & Plan Note (Signed)
Immunizations UTD. Discussed Shingrix vaccines.  Colon cancer screening UTD, due in 2026 PSA due and pending.  Discussed the importance of a healthy diet and regular exercise in order for weight loss, and to reduce the risk of further co-morbidity.  Exam stable. Labs pending.  Follow up in 1 year for repeat physical.

## 2022-11-20 NOTE — Patient Instructions (Signed)
Stop by the lab prior to leaving today. I will notify you of your results once received.   Reduce your furosemide to 1 pill daily for swelling. This could help to prevent your dizziness.  Consider the Shingles vaccine as discussed.   It was a pleasure to see you today!

## 2022-11-20 NOTE — Assessment & Plan Note (Signed)
Appears euvolemic. He does have postural dizziness.   Reduce Lasix to 20 mg daily. CMP pending.

## 2022-11-20 NOTE — Assessment & Plan Note (Signed)
Declines further evaluation. 

## 2022-11-20 NOTE — Assessment & Plan Note (Signed)
Controlled.  Continue gabapentin 600 mg BID.

## 2022-11-20 NOTE — Assessment & Plan Note (Signed)
Commended him on weight loss! ?Repeat A1C pending. ?

## 2022-12-24 ENCOUNTER — Ambulatory Visit (INDEPENDENT_AMBULATORY_CARE_PROVIDER_SITE_OTHER): Payer: Medicare Other

## 2022-12-24 VITALS — Wt 249.0 lb

## 2022-12-24 DIAGNOSIS — Z0000 Encounter for general adult medical examination without abnormal findings: Secondary | ICD-10-CM

## 2022-12-24 NOTE — Progress Notes (Signed)
Subjective:   Timothy Sheppard is a 77 y.o. male who presents for Medicare Annual/Subsequent preventive examination.  Review of Systems    I connected with  [name] on 12/24/22 by a audio enabled telemedicine application and verified that I am speaking with the correct person using two identifiers.  Patient Location: Home  Provider Location: Home Office  I discussed the limitations of evaluation and management by telemedicine. The patient expressed understanding and agreed to proceed.  Cardiac Risk Factors include: advanced age (>72men, >75 women);hypertension     Objective:    Today's Vitals   12/24/22 0932  Weight: 249 lb (112.9 kg)   Body mass index is 34.73 kg/m.     12/24/2022    9:40 AM 01/28/2022   12:15 PM 01/17/2022    8:17 AM 12/06/2021    2:05 PM 05/08/2020   10:39 AM 11/02/2019    3:32 PM 06/08/2018   11:31 AM  Advanced Directives  Does Patient Have a Medical Advance Directive? Yes Yes Yes Yes No Yes Yes  Type of Estate agent of [place];Living will Healthcare Power of [place];Living will Healthcare Power of [place];Living will Healthcare Power of [place];Living will  Healthcare Power of [place];Living will Healthcare Power of [place];Living will  Does patient want to make changes to medical advance directive?  No - Guardian declined  No - Patient declined     Copy of Healthcare Power of Attorney in Chart? No - copy requested No - copy requested  No - copy requested  No - copy requested No - copy requested  Would patient like information on creating a medical advance directive?     No - Patient declined      Current Medications (verified) Outpatient Encounter Medications as of 12/24/2022  Medication Sig   aspirin EC 81 MG tablet Take 1 tablet (81 mg total) by mouth 2 (two) times daily.   diclofenac Sodium (VOLTAREN) 1 % GEL Apply 2 g topically 3 (three) times daily as needed. For pain.   ferrous sulfate 325 (65 FE) MG tablet  Take 650 mg by mouth daily with breakfast.   fluticasone (FLONASE) 50 MCG/ACT nasal spray Place 1 spray into both nostrils daily as needed for allergies or rhinitis.   furosemide (LASIX) 20 MG tablet Take 1 tablet (20 mg total) by mouth daily. For leg swelling.   gabapentin (NEURONTIN) 300 MG capsule TAKE 1 CAPSULE IN THE MORNING AND 3 CAPS AT BEDTIME FOR PAIN   tamsulosin (FLOMAX) 0.4 MG CAPS capsule TAKE 1 CAPSULE BY MOUTH IN THE MORNING FOR  URINE  STREAM   No facility-administered encounter medications on file as of 12/24/2022.    Allergies (verified) Patient has no known allergies.   History: Past Medical History:  Diagnosis Date   Acute pain of left shoulder 08/29/2020   Allergic rhinitis 08/25/2015   Arthritis    Basal cell carcinoma 05/13/2019   Drosum nose supratip - ED&C    BPH (benign prostatic hyperplasia) 07/19/2013   Mildly uniformly enlarged on exam 07/19/13  Lab Results  Component Value Date   PSA 0.30 07/19/2013      Chest pain 04/25/2013   CHF (congestive heart failure) (HCC)    pt denies   Chronic hip pain    Right   Chronic pain of right lower extremity 11/27/2017   Contact dermatitis due to plant 01/23/2018   DOE (dyspnea on exertion) 01/23/2022   Erectile dysfunction    Fatigue 09/04/2018   History of  kidney stones    HTN (hypertension)    Hyperlipidemia    Hypertension    Iritis    Iron deficiency anemia 09/28/2021   Neuropathy    Palpitations 05/03/2013   Poison ivy dermatitis 12/30/2019   Prediabetes 06/11/2018   Preoperative clearance 12/21/2021   Preventative health care 06/11/2018   Sleep apnea    Past Surgical History:  Procedure Laterality Date   BACK SURGERY  2000   HERNIA REPAIR     ORIF FEMUR FRACTURE Right 1967   rod insertion   TONSILLECTOMY AND ADENOIDECTOMY     TOTAL HIP ARTHROPLASTY Right 01/28/2022   Procedure: RIGHT TOTAL HIP ARTHROPLASTY POSTERIOR, REMOVAL OF K NAIL;  Surgeon: Gean Birchwood, MD;  Location: [facility];  Service:  Orthopedics;  Laterality: Right;   Family History  Problem Relation Age of Onset   Heart failure Mother    Lupus Mother    Heart failure Father    Social History   Socioeconomic History   Marital status: Married    Spouse name: Not on file   Number of children: Not on file   Years of education: Not on file   Highest education level: Not on file  Occupational History   Not on file  Tobacco Use   Smoking status: Former    Types: Pipe   Smokeless tobacco: Former   Tobacco comments:    Quit 1 mo ago  Building services engineer Use: Never used  Substance and Sexual Activity   Alcohol use: No   Drug use: No   Sexual activity: Not on file  Other Topics Concern   Not on file  Social History Narrative   ** Merged History Encounter **       Married. 2 children, 2 grandchildren, 2 great grandchilden. Retired.     Social Determinants of Health   Financial Resource Strain: Low Risk  (12/24/2022)   Overall Financial Resource Strain (CARDIA)    Difficulty of Paying Living Expenses: Not hard at all  Food Insecurity: No Food Insecurity (12/24/2022)   Hunger Vital Sign    Worried About Running Out of Food in the Last Year: Never true    Ran Out of Food in the Last Year: Never true  Transportation Needs: No Transportation Needs (12/24/2022)   PRAPARE - Administrator, Civil Service (Medical): No    Lack of Transportation (Non-Medical): No  Physical Activity: Sufficiently Active (12/24/2022)   Exercise Vital Sign    Days of Exercise per Week: 7 days    Minutes of Exercise per Session: 30 min  Stress: No Stress Concern Present (12/24/2022)   [employer] of Occupational Health - Occupational Stress Questionnaire    Feeling of Stress : Not at all  Social Connections: Socially Integrated (12/24/2022)   Social Connection and Isolation Panel [NHANES]    Frequency of Communication with Friends and Family: More than three times a week    Frequency of Social Gatherings with Friends  and Family: More than three times a week    Attends Religious Services: More than 4 times per year    Active Member of [employer] or Organizations: Yes    Attends Engineer, structural: More than 4 times per year    Marital Status: Married    Tobacco Counseling Counseling given: Yes Tobacco comments: Quit 1 mo ago   Clinical Intake:  Pre-visit preparation completed: Yes  Pain : No/denies pain     BMI - recorded: 34.73 Nutritional Status:  BMI > 30  Obese Nutritional Risks: None Diabetes: No  How often do you need to have someone help you when you read instructions, pamphlets, or other written materials from your doctor or pharmacy?: 1 - Never  Diabetic?no  Interpreter Needed?: No  Information entered by :: Fredirick Maudlin   Activities of Daily Living    12/24/2022    9:42 AM 01/28/2022   12:15 PM  In your present state of health, do you have any difficulty performing the following activities:  Hearing? 0 1  Vision? 0 0  Difficulty concentrating or making decisions? 0 0  Walking or climbing stairs? 0 1  Dressing or bathing? 0 0  Doing errands, shopping? 0 0  Preparing Food and eating ? N   Using the Toilet? N   In the past six months, have you accidently leaked urine? N   Do you have problems with loss of bowel control? N   Managing your Medications? N   Managing your Finances? N   Housekeeping or managing your Housekeeping? N     Patient Care Team: Doreene Nest, NP as PCP - General (Internal Medicine)  Indicate any recent Medical Services you may have received from other than [facility] providers in the past year (date may be approximate).     Assessment:   This is a routine wellness examination for Demarques.  Hearing/Vision screen Hearing Screening - Comments:: Denies hearing difficulties   Vision Screening - Comments:: Wears rx glasses otc reader no  unknown   Dietary issues and exercise activities discussed: Current Exercise Habits: Home  exercise routine, Type of exercise: walking (yardwork), Time (Minutes): 40, Frequency (Times/Week): 5, Weekly Exercise (Minutes/Week): 200, Intensity: Moderate, Exercise limited by: orthopedic condition(s)   Goals Addressed             This Visit's Progress    Patient Stated       Stay active       Depression Screen    12/24/2022    9:35 AM 12/06/2021    2:01 PM 11/02/2019    3:35 PM 06/08/2018   11:31 AM 11/25/2017   10:28 AM 07/18/2017    8:36 AM 06/09/2017    8:51 AM  PHQ 2/9 Scores  PHQ - 2 Score 0 0 0 0 0 0 0  PHQ- 9 Score   0 0       Fall Risk    12/24/2022    9:41 AM 11/20/2022    8:34 AM 12/06/2021    2:04 PM 11/02/2019    3:33 PM 06/08/2018   11:31 AM  Fall Risk   Falls in the past year? 0 0 0 1 0  Comment    fell out of bed   Number falls in past yr: 0 0 0 0   Injury with Fall? 0 0 0 0   Risk for fall due to : No Fall Risks No Fall Risks No Fall Risks Medication side effect   Follow up Falls prevention discussed;Falls evaluation completed Falls evaluation completed  Falls evaluation completed;Falls prevention discussed     FALL RISK PREVENTION PERTAINING TO THE HOME:  Any stairs in or around the home? Yes  If so, are there any without handrails? No  Home free of loose throw rugs in walkways, pet beds, electrical cords, etc? No  Adequate lighting in your home to reduce risk of falls? Yes   ASSISTIVE DEVICES UTILIZED TO PREVENT FALLS:  Life alert? No  Use of a cane, walker or  w/c? Yes  Grab bars in the bathroom? Yes  Shower chair or bench in shower? Yes  Elevated toilet seat or a handicapped toilet? Yes   TIMED UP AND GO:  Was the test performed?  no televisit .    Cognitive Function:    11/02/2019    3:38 PM 06/08/2018   11:31 AM  MMSE - Mini Mental State Exam  Orientation to time 5 5  Orientation to Place 5 5  Registration 3 3  Attention/ Calculation 5 0  Recall 3 3  Language- name 2 objects  0  Language- repeat 1 1  Language- follow 3 step  command  3  Language- read & follow direction  0  Write a sentence  0  Copy design  0  Total score  20        12/24/2022    9:36 AM 12/06/2021    2:05 PM  6CIT Screen  What Year? 0 points 0 points  What month? 0 points 0 points  What time? 0 points 0 points  Count back from 20 0 points 0 points  Months in reverse 0 points 0 points  Repeat phrase 0 points 0 points  Total Score 0 points 0 points    Immunizations Immunization History  Administered Date(s) Administered   Fluad Quad(high Dose 65+) 05/03/2020   Influenza,inj,Quad PF,6+ Mos 04/21/2013, 04/11/2015, 04/02/2018   Influenza-Unspecified 04/14/2017   Pneumococcal Conjugate-13 06/09/2017   Pneumococcal Polysaccharide-23 06/08/2018   Tdap 05/11/2014    TDAP status: Up to date  Flu Vaccine status: Declined, Education has been provided regarding the importance of this vaccine but patient still declined. Advised may receive this vaccine at local pharmacy or Health Dept. Aware to provide a copy of the vaccination record if obtained from local pharmacy or Health Dept. Verbalized acceptance and understanding.  Pneumococcal vaccine status: Declined,  Education has been provided regarding the importance of this vaccine but patient still declined. Advised may receive this vaccine at local pharmacy or Health Dept. Aware to provide a copy of the vaccination record if obtained from local pharmacy or Health Dept. Verbalized acceptance and understanding.   Covid-19 vaccine status: Declined, Education has been provided regarding the importance of this vaccine but patient still declined. Advised may receive this vaccine at local pharmacy or Health Dept.or vaccine clinic. Aware to provide a copy of the vaccination record if obtained from local pharmacy or Health Dept. Verbalized acceptance and understanding.  Qualifies for Shingles Vaccine? Yes   Zostavax completed No   Shingrix Completed?: No.    Education has been provided regarding the  importance of this vaccine. Patient has been advised to call insurance company to determine out of pocket expense if they have not yet received this vaccine. Advised may also receive vaccine at local pharmacy or Health Dept. Verbalized acceptance and understanding.  Screening Tests Health Maintenance  Topic Date Due   COVID-19 Vaccine (1) Never done   Zoster Vaccines- Shingrix (1 of 2) 02/20/2023 (Originally 02/07/1965)   INFLUENZA VACCINE  02/20/2023   Medicare Annual Wellness (AWV)  12/24/2023   DTaP/Tdap/Td (2 - Td or Tdap) 05/11/2024   Fecal DNA (Cologuard)  10/05/2024   Pneumonia Vaccine 45+ Years old  Completed   Hepatitis C Screening  Completed   HPV VACCINES  Aged Out    Health Maintenance  Health Maintenance Due  Topic Date Due   COVID-19 Vaccine (1) Never done    Colorectal cancer screening: Type of screening: FOBT/FIT. Completed 10/05/21. Repeat every  3 years  Lung Cancer Screening: (Low Dose CT Chest recommended if Age 38-80 years, 30 pack-year currently smoking OR have quit w/in 15years.) does qualify.   Lung Cancer Screening Referral: NO  Additional Screening:  Hepatitis C Screening: does qualify; discuss at lab appointment  Vision Screening: Recommended annual ophthalmology exams for early detection of glaucoma and other disorders of the eye. Is the patient up to date with their annual eye exam?  No  Who is the provider or what is the name of the office in which the patient attends annual eye exams? unknown If pt is not established with a provider, would they like to be referred to a provider to establish care? No .   Dental Screening: Recommended annual dental exams for proper oral hygiene  Community Resource Referral / Chronic Care Management: CRR required this visit?  No   CCM required this visit?  No      Plan:     I have personally reviewed and noted the following in the patient's chart:   Medical and social history Use of alcohol, tobacco or  illicit drugs  Current medications and supplements including opioid prescriptions. Patient is not currently taking opioid prescriptions. Functional ability and status Nutritional status Physical activity Advanced directives List of other physicians Hospitalizations, surgeries, and ER visits in previous 12 months Vitals Screenings to include cognitive, depression, and falls Referrals and appointments  In addition, I have reviewed and discussed with patient certain preventive protocols, quality metrics, and best practice recommendations. A written personalized care plan for preventive services as well as general preventive health recommendations were provided to patient.     Annabell Sabal, CMA   12/24/2022   Nurse Notes: none

## 2022-12-24 NOTE — Patient Instructions (Signed)
Timothy Sheppard , Thank you for taking time to come for your Medicare Wellness Visit. I appreciate your ongoing commitment to your health goals. Please review the following plan we discussed and let me know if I can assist you in the future.   These are the goals we discussed:  Goals       Patient Stated     Increase physical activity (pt-stated)      Get back to doing yard work.      Other     Increase physical activity      Starting 06/08/2018, I will continue to walk at least 60 minutes every other day.       Patient Stated      11/02/2019, I will maintain and continue medications as prescribed.       Patient Stated      Stay active         This is a list of the screening recommended for you and due dates:  Health Maintenance  Topic Date Due   Zoster (Shingles) Vaccine (1 of 2) 02/20/2023*   COVID-19 Vaccine (1) 12/24/2023*   Flu Shot  02/20/2023   Medicare Annual Wellness Visit  12/24/2023   DTaP/Tdap/Td vaccine (2 - Td or Tdap) 05/11/2024   Cologuard (Stool DNA test)  10/05/2024   Pneumonia Vaccine  Completed   Hepatitis C Screening  Completed   HPV Vaccine  Aged Out  *Topic was postponed. The date shown is not the original due date.    Advanced directives: Advance directive discussed with you today. Even though you declined this today, please call our office should you change your mind, and we can give you the proper paperwork for you to fill out.   Conditions/risks identified: Aim for 30 minutes of exercise or brisk walking, 6-8 glasses of water, and 5 servings of fruits and vegetables each day.   Next appointment: Follow up in one year for your annual wellness visit. 12/26/22  Preventive Care 65 Years and Older, Male  Preventive care refers to lifestyle choices and visits with your health care provider that can promote health and wellness. What does preventive care include? A yearly physical exam. This is also called an annual well check. Dental exams once or  twice a year. Routine eye exams. Ask your health care provider how often you should have your eyes checked. Personal lifestyle choices, including: Daily care of your teeth and gums. Regular physical activity. Eating a healthy diet. Avoiding tobacco and drug use. Limiting alcohol use. Practicing safe sex. Taking low doses of aspirin every day. Taking vitamin and mineral supplements as recommended by your health care provider. What happens during an annual well check? The services and screenings done by your health care provider during your annual well check will depend on your age, overall health, lifestyle risk factors, and family history of disease. Counseling  Your health care provider may ask you questions about your: Alcohol use. Tobacco use. Drug use. Emotional well-being. Home and relationship well-being. Sexual activity. Eating habits. History of falls. Memory and ability to understand (cognition). Work and work Astronomer. Screening  You may have the following tests or measurements: Height, weight, and BMI. Blood pressure. Lipid and cholesterol levels. These may be checked every 5 years, or more frequently if you are over 16 years old. Skin check. Lung cancer screening. You may have this screening every year starting at age 32 if you have a 30-pack-year history of smoking and currently smoke or have quit within  the past 15 years. Fecal occult blood test (FOBT) of the stool. You may have this test every year starting at age 17. Flexible sigmoidoscopy or colonoscopy. You may have a sigmoidoscopy every 5 years or a colonoscopy every 10 years starting at age 78. Prostate cancer screening. Recommendations will vary depending on your family history and other risks. Hepatitis C blood test. Hepatitis B blood test. Sexually transmitted disease (STD) testing. Diabetes screening. This is done by checking your blood sugar (glucose) after you have not eaten for a while (fasting).  You may have this done every 1-3 years. Abdominal aortic aneurysm (AAA) screening. You may need this if you are a current or former smoker. Osteoporosis. You may be screened starting at age 20 if you are at high risk. Talk with your health care provider about your test results, treatment options, and if necessary, the need for more tests. Vaccines  Your health care provider may recommend certain vaccines, such as: Influenza vaccine. This is recommended every year. Tetanus, diphtheria, and acellular pertussis (Tdap, Td) vaccine. You may need a Td booster every 10 years. Zoster vaccine. You may need this after age 64. Pneumococcal 13-valent conjugate (PCV13) vaccine. One dose is recommended after age 83. Pneumococcal polysaccharide (PPSV23) vaccine. One dose is recommended after age 51. Talk to your health care provider about which screenings and vaccines you need and how often you need them. This information is not intended to replace advice given to you by your health care provider. Make sure you discuss any questions you have with your health care provider. Document Released: 08/04/2015 Document Revised: 03/27/2016 Document Reviewed: 05/09/2015 Elsevier Interactive Patient Education  2017 [employer].  Fall Prevention in the Home Falls can cause injuries. They can happen to people of all ages. There are many things you can do to make your home safe and to help prevent falls. What can I do on the outside of my home? Regularly fix the edges of walkways and driveways and fix any cracks. Remove anything that might make you trip as you walk through a door, such as a raised step or threshold. Trim any bushes or trees on the path to your home. Use bright outdoor lighting. Clear any walking paths of anything that might make someone trip, such as rocks or tools. Regularly check to see if handrails are loose or broken. Make sure that both sides of any steps have handrails. Any raised decks and  porches should have guardrails on the edges. Have any leaves, snow, or ice cleared regularly. Use sand or salt on walking paths during winter. Clean up any spills in your garage right away. This includes oil or grease spills. What can I do in the bathroom? Use night lights. Install grab bars by the toilet and in the tub and shower. Do not use towel bars as grab bars. Use non-skid mats or decals in the tub or shower. If you need to sit down in the shower, use a plastic, non-slip stool. Keep the floor dry. Clean up any water that spills on the floor as soon as it happens. Remove soap buildup in the tub or shower regularly. Attach bath mats securely with double-sided non-slip rug tape. Do not have throw rugs and other things on the floor that can make you trip. What can I do in the bedroom? Use night lights. Make sure that you have a light by your bed that is easy to reach. Do not use any sheets or blankets that are too big  for your bed. They should not hang down onto the floor. Have a firm chair that has side arms. You can use this for support while you get dressed. Do not have throw rugs and other things on the floor that can make you trip. What can I do in the kitchen? Clean up any spills right away. Avoid walking on wet floors. Keep items that you use a lot in easy-to-reach places. If you need to reach something above you, use a strong step stool that has a grab bar. Keep electrical cords out of the way. Do not use floor polish or wax that makes floors slippery. If you must use wax, use non-skid floor wax. Do not have throw rugs and other things on the floor that can make you trip. What can I do with my stairs? Do not leave any items on the stairs. Make sure that there are handrails on both sides of the stairs and use them. Fix handrails that are broken or loose. Make sure that handrails are as long as the stairways. Check any carpeting to make sure that it is firmly attached to the  stairs. Fix any carpet that is loose or worn. Avoid having throw rugs at the top or bottom of the stairs. If you do have throw rugs, attach them to the floor with carpet tape. Make sure that you have a light switch at the top of the stairs and the bottom of the stairs. If you do not have them, ask someone to add them for you. What else can I do to help prevent falls? Wear shoes that: Do not have high heels. Have rubber bottoms. Are comfortable and fit you well. Are closed at the toe. Do not wear sandals. If you use a stepladder: Make sure that it is fully opened. Do not climb a closed stepladder. Make sure that both sides of the stepladder are locked into place. Ask someone to hold it for you, if possible. Clearly mark and make sure that you can see: Any grab bars or handrails. First and last steps. Where the edge of each step is. Use tools that help you move around (mobility aids) if they are needed. These include: Canes. Walkers. Scooters. Crutches. Turn on the lights when you go into a dark area. Replace any light bulbs as soon as they burn out. Set up your furniture so you have a clear path. Avoid moving your furniture around. If any of your floors are uneven, fix them. If there are any pets around you, be aware of where they are. Review your medicines with your doctor. Some medicines can make you feel dizzy. This can increase your chance of falling. Ask your doctor what other things that you can do to help prevent falls. This information is not intended to replace advice given to you by your health care provider. Make sure you discuss any questions you have with your health care provider. Document Released: 05/04/2009 Document Revised: 12/14/2015 Document Reviewed: 08/12/2014 Elsevier Interactive Patient Education  2017 [employer].

## 2022-12-27 ENCOUNTER — Other Ambulatory Visit: Payer: Self-pay | Admitting: Primary Care

## 2022-12-27 DIAGNOSIS — R600 Localized edema: Secondary | ICD-10-CM

## 2023-01-01 ENCOUNTER — Other Ambulatory Visit: Payer: Self-pay | Admitting: Primary Care

## 2023-01-01 DIAGNOSIS — G629 Polyneuropathy, unspecified: Secondary | ICD-10-CM

## 2023-01-10 IMAGING — DX DG SHOULDER 2+V*L*
3 series · 3 of 3 positions shown · non-contrast
Comparison: None.

CLINICAL DATA: Acute left shoulder pain with repetitive movement.

EXAM:
LEFT SHOULDER - 2+ VIEW

[shoulder axial]
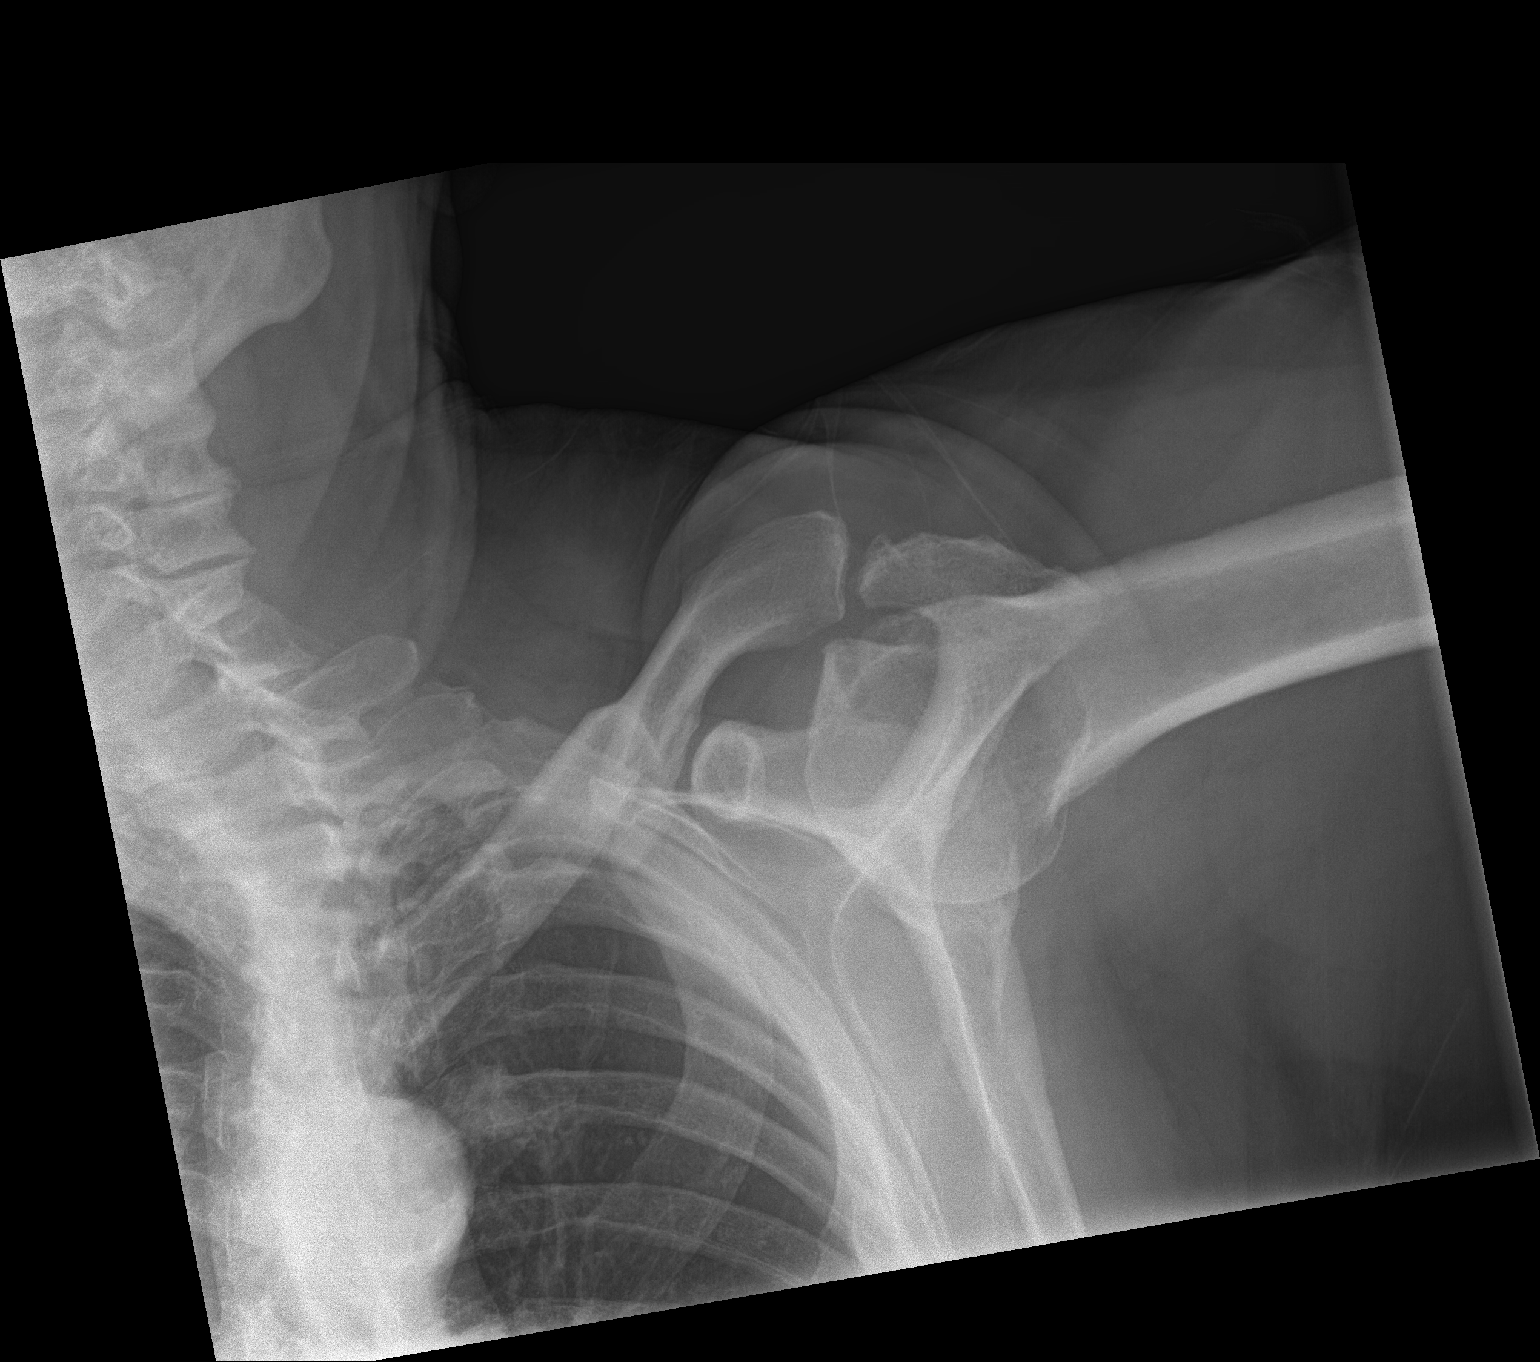

[shoulder obl]
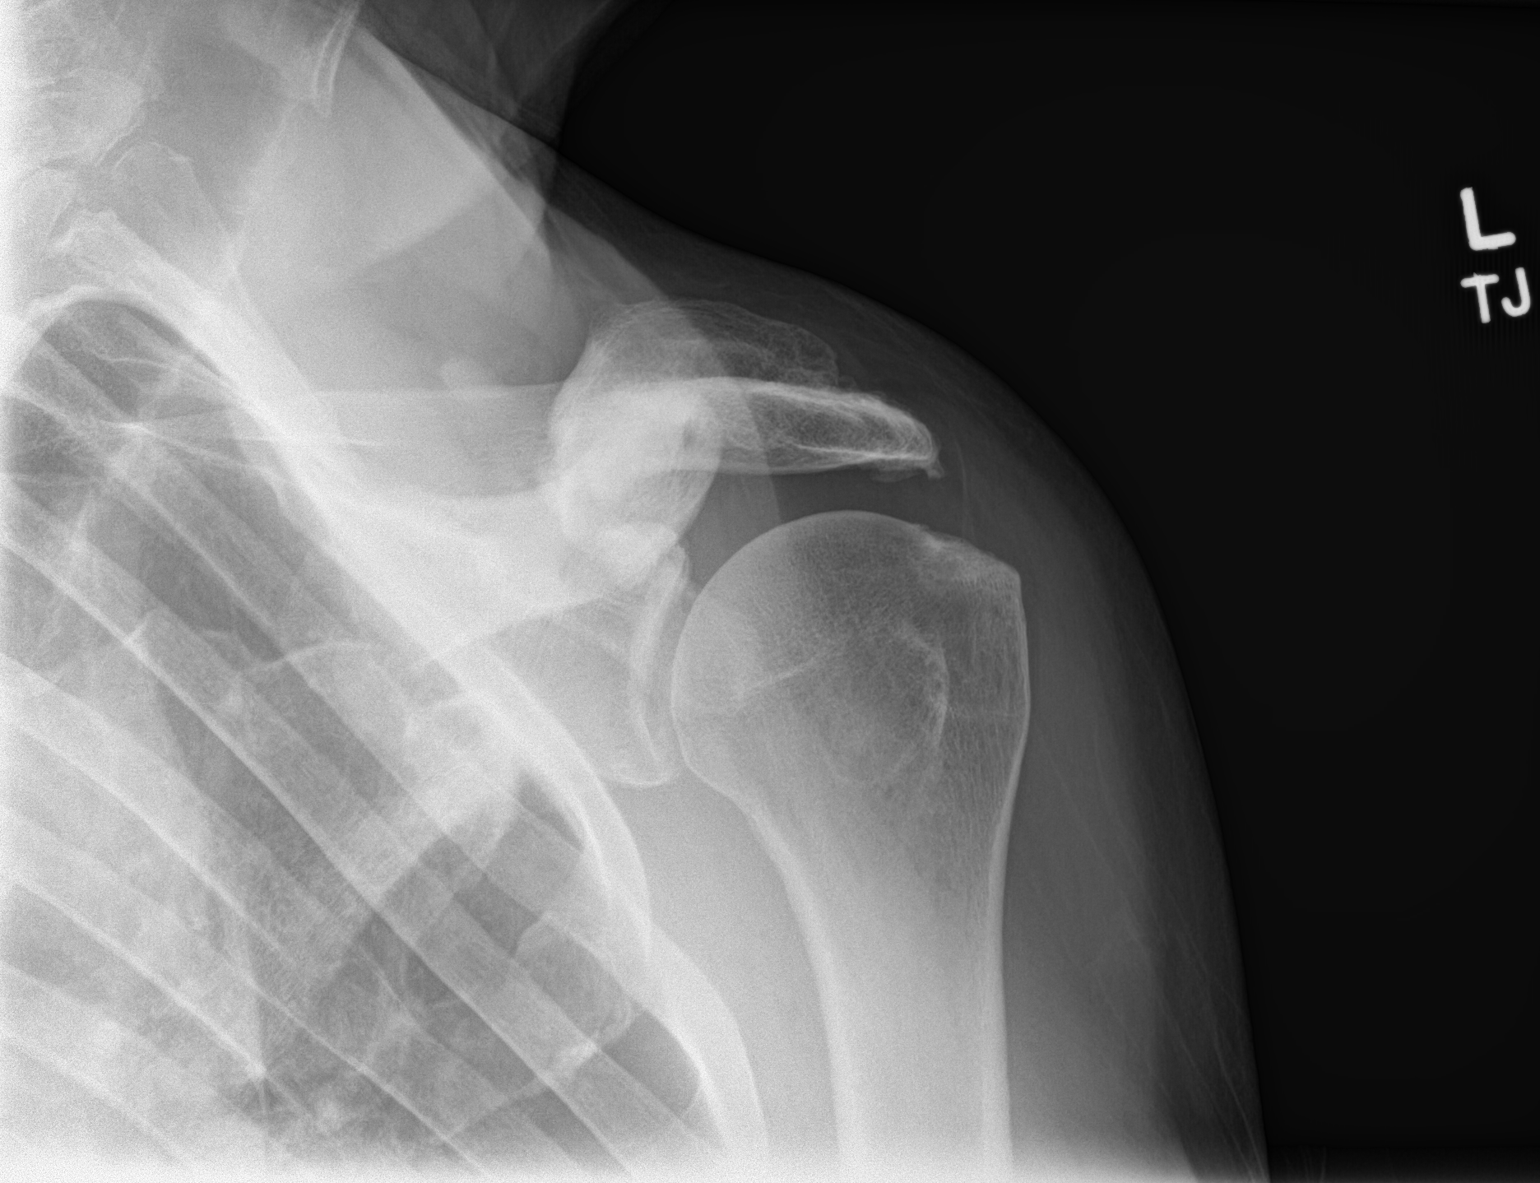

[shoulder y-view]
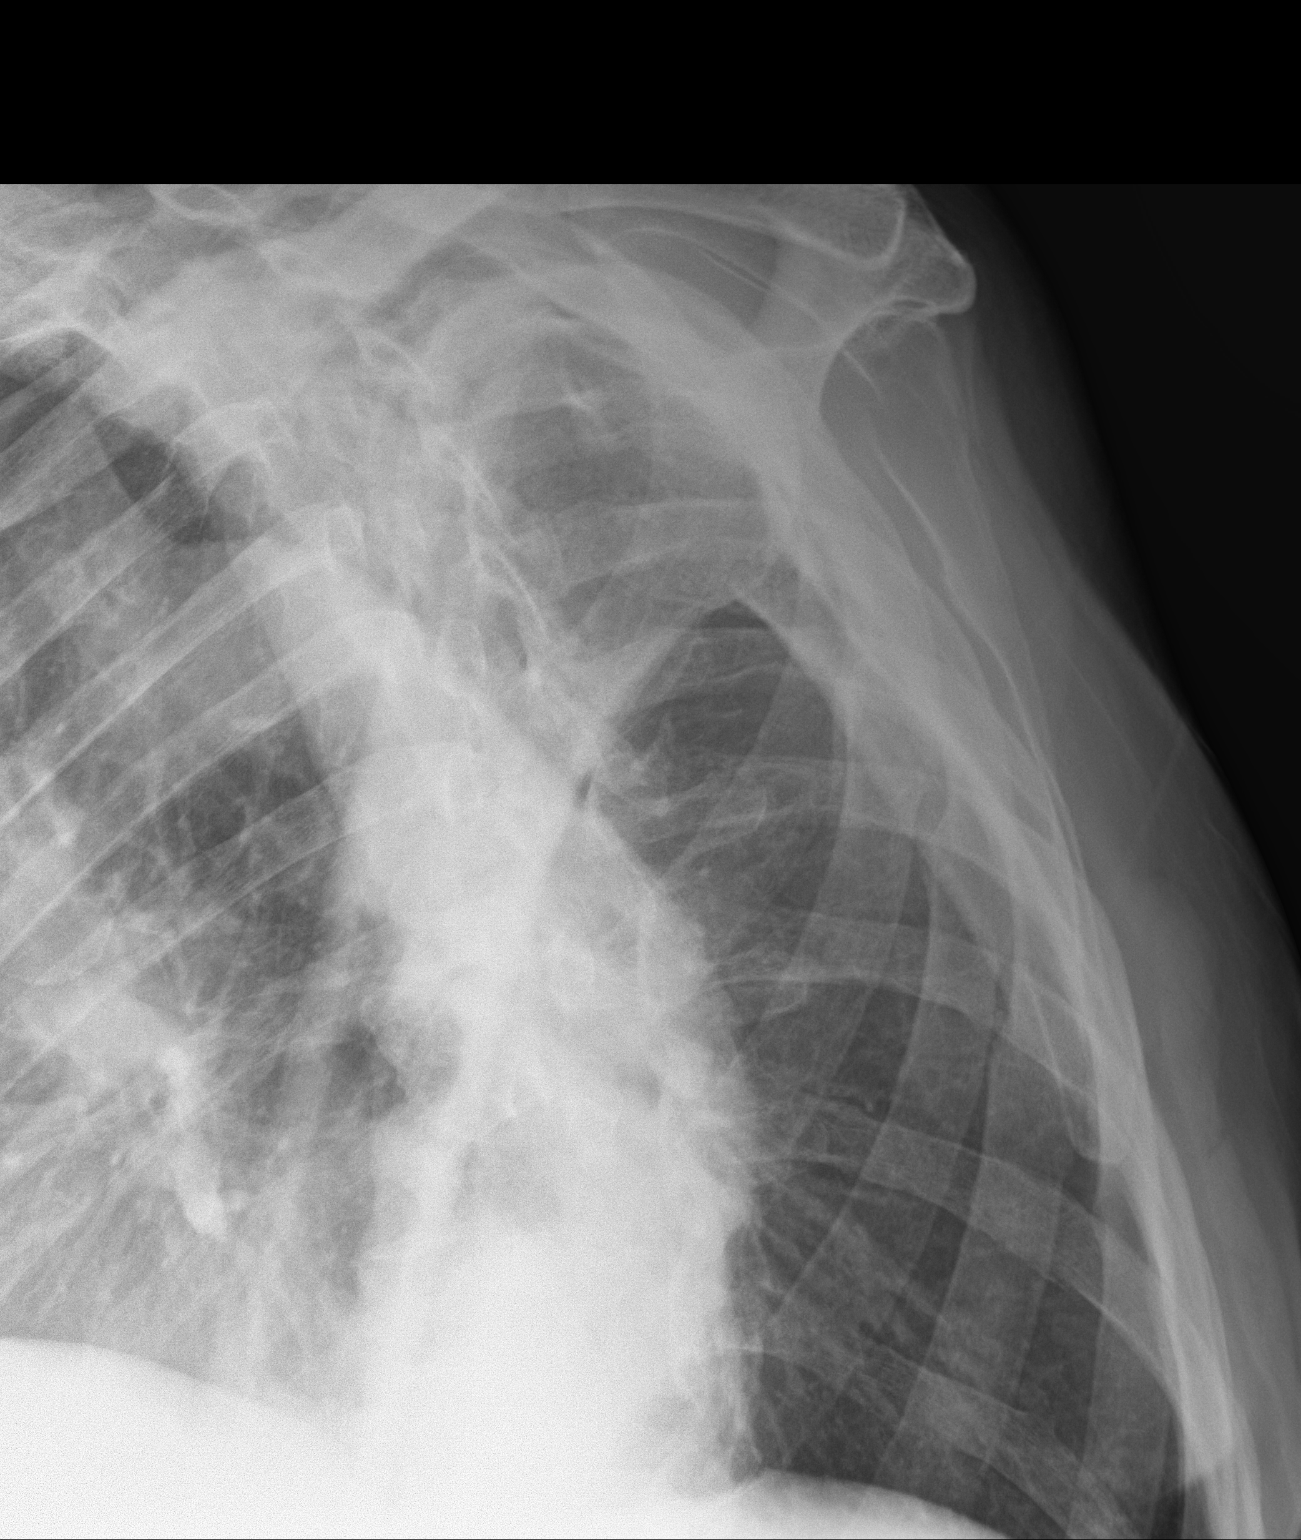

[3 of 3 positions shown; findings below may reference images not displayed]

FINDINGS: Chronic healed fracture left clavicle. Negative for acute fracture.
Normal alignment. Normal shoulder joint.
IMPRESSION: Chronic left clavicle fracture.  No acute abnormality.

## 2023-02-27 ENCOUNTER — Other Ambulatory Visit: Payer: Self-pay | Admitting: Primary Care

## 2023-02-27 DIAGNOSIS — N401 Enlarged prostate with lower urinary tract symptoms: Secondary | ICD-10-CM

## 2023-03-03 ENCOUNTER — Other Ambulatory Visit: Payer: Self-pay | Admitting: Primary Care

## 2023-03-03 DIAGNOSIS — N529 Male erectile dysfunction, unspecified: Secondary | ICD-10-CM

## 2023-03-11 ENCOUNTER — Ambulatory Visit (INDEPENDENT_AMBULATORY_CARE_PROVIDER_SITE_OTHER): Payer: Medicare Other | Admitting: Family

## 2023-03-11 ENCOUNTER — Encounter: Payer: Self-pay | Admitting: Family

## 2023-03-11 VITALS — BP 136/70 | HR 55 | Temp 97.6°F | Ht 71.0 in | Wt 259.2 lb

## 2023-03-11 DIAGNOSIS — L237 Allergic contact dermatitis due to plants, except food: Secondary | ICD-10-CM | POA: Insufficient documentation

## 2023-03-11 MED ORDER — PREDNISONE 20 MG PO TABS: ORAL_TABLET | ORAL | 0 refills | Status: DC

## 2023-03-11 MED ORDER — METHYLPREDNISOLONE ACETATE 40 MG/ML IJ SUSP
40.0000 mg | Freq: Once | INTRAMUSCULAR | Status: AC
  Administered 2023-03-11: 40 mg via INTRAMUSCULAR

## 2023-03-11 NOTE — Progress Notes (Signed)
Established Patient Office Visit  Subjective:   Patient ID: Timothy Sheppard, male    DOB: June 05, 1946  Age: 77 y.o. MRN: [id number]  CC:  Chief Complaint  Patient presents with   Poison Ivy    Pt stated that he came down with a rash on Sunday and it thinks its poison ivy     HPI: Timothy Sheppard is a 77 y.o. male presenting on 03/11/2023 for Poison Ivy (Pt stated that he came down with a rash on Sunday and it thinks its poison ivy/)   Was weed wacking and was exposed to poison ivy. He now has the rash on his right upper forehead and also on his left upper arm. Slight on the right lower wrist as well. He states it is very itchy and uncomfortable. Not currently on his eye.       ROS: Negative unless specifically indicated above in HPI.   Relevant past medical history reviewed and updated as indicated.   Allergies and medications reviewed and updated.   Current Outpatient Medications:    predniSONE (DELTASONE) 20 MG tablet, Take two tablets po qd for five days, one tablet po qd for five days, then 1/2 tablet po qd for five days, Disp: 18 tablet, Rfl: 0   aspirin EC 81 MG tablet, Take 1 tablet (81 mg total) by mouth 2 (two) times daily., Disp: 60 tablet, Rfl: 0   diclofenac Sodium (VOLTAREN) 1 % GEL, Apply 2 g topically 3 (three) times daily as needed. For pain., Disp: 100 g, Rfl: 0   ferrous sulfate 325 (65 FE) MG tablet, Take 650 mg by mouth daily with breakfast., Disp: , Rfl:    fluticasone (FLONASE) 50 MCG/ACT nasal spray, Place 1 spray into both nostrils daily as needed for allergies or rhinitis., Disp: , Rfl:    furosemide (LASIX) 20 MG tablet, Take 1 tablet (20 mg total) by mouth daily. For leg swelling, Disp: 90 tablet, Rfl: 2   gabapentin (NEURONTIN) 300 MG capsule, TAKE 1 CAPSULE IN THE MORNING AND 3 CAPS AT BEDTIME FOR PAIN, Disp: 360 capsule, Rfl: 2   tamsulosin (FLOMAX) 0.4 MG CAPS capsule, TAKE 1 CAPSULE BY MOUTH IN THE MORNING FOR  URINE  STREAM, Disp: 90 capsule,  Rfl: 2  Current Facility-Administered Medications:    methylPREDNISolone acetate (DEPO-MEDROL) injection 40 mg, 40 mg, Intramuscular, Once,   No Known Allergies  Objective:   BP 136/70   Pulse (!) 55   Temp 97.6 F (36.4 C)   Ht 5\' 11"  (1.803 m)   Wt 259 lb 3.2 oz (117.6 kg)   SpO2 96%   BMI 36.15 kg/m    Physical Exam Constitutional:      General: He is not in acute distress.    Appearance: Normal appearance. He is normal weight. He is not ill-appearing, toxic-appearing or diaphoretic.  Cardiovascular:     Rate and Rhythm: Normal rate.  Pulmonary:     Effort: Pulmonary effort is normal.  Musculoskeletal:        General: Normal range of motion.  Skin:    Comments: Raised erythematic spotty rash on right upper forehead, left upper cheek bone and left anterior bicep to forearm.   Neurological:     General: No focal deficit present.     Mental Status: He is alert and oriented to person, place, and time. Mental status is at baseline.  Psychiatric:        Mood and Affect: Mood normal.  Behavior: Behavior normal.        Thought Content: Thought content normal.        Judgment: Judgment normal.     Assessment & Plan:  Poison ivy dermatitis Assessment & Plan: Injection 40 mg depo medrol in office today  Rx prednisone taper for 15 days Ok for otc calamine lotion or hydrocortisone cream  Monitor for worsening signs/symptoms of infection to include: increasing redness, increasing tenderness, increase in size, and or pustulant drainage from site. If this is to occur please let me know immediately.    Orders: -     methylPREDNISolone Acetate -     predniSONE; Take two tablets po qd for five days, one tablet po qd for five days, then 1/2 tablet po qd for five days  Dispense: 18 tablet; Refill: 0     Follow up plan: No follow-ups on file.  Mort Sawyers, FNP

## 2023-03-11 NOTE — Assessment & Plan Note (Signed)
Injection 40 mg depo medrol in office today  Rx prednisone taper for 15 days Ok for otc calamine lotion or hydrocortisone cream  Monitor for worsening signs/symptoms of infection to include: increasing redness, increasing tenderness, increase in size, and or pustulant drainage from site. If this is to occur please let me know immediately.

## 2023-08-27 ENCOUNTER — Ambulatory Visit: Payer: Self-pay | Admitting: Primary Care

## 2023-08-27 NOTE — Telephone Encounter (Signed)
 Noted, will evaluate.

## 2023-08-27 NOTE — Telephone Encounter (Signed)
 I spoke with pt; for 2 wk pt has been lightheaded on and off; pt said last time lightheaded was within the last wk. Today pt feels fine; no CP,SOB,lightheadedness, H/a, or vision changes. 08/26/23 BP 161/98. 08/27/23 earlier this morning BP 176/105 P?  Now BP 177/103 P 87. Pt said card took him off BP meds years ago. I spoke with MARLA Gaskins NP and she said OK to add on to her schedule 08/29/23 at 2:20 with UC & ED precautions. Pt and pts wife voiced understanding and appreciative. Sending note as FYI to MARLA Gaskins NP.

## 2023-08-27 NOTE — Telephone Encounter (Signed)
 Copied from CRM [id number]. Topic: Clinical - Red Word Triage >> Aug 27, 2023 11:23 AM Macario HERO wrote: Red Word that prompted transfer to Nurse Triage: Patient spouse called and stated his blood pressure has been high and right now it is 161/98.   Chief Complaint: Hypertension Symptoms: intermittent dizziness Frequency: unk Pertinent Negatives: Patient denies chest pain, shortness of breath Disposition: [x] ED /[] Urgent Care (no appt availability in office) / [] Appointment(In office/virtual)/ []  [facility]/ [] Home Care/ [x] Refused Recommended Disposition /[] [facility]/ []  Follow-up with PCP Additional Notes: Patient reports high blood pressure readings at home. BP 161/98 yesterday, and 176/105 while on call with this RN. Pt reports dizziness as well, denies blurred vision or chest pain. RN advising ED. Pt refused. Call made to [facility], transferred to Nurse Rena.    Reason for Disposition  [1] Systolic BP  >= 160 OR Diastolic >= 100 AND [2] cardiac (e.g., breathing difficulty, chest pain) or neurologic symptoms (e.g., new-onset blurred or double vision, unsteady gait)  Answer Assessment - Initial Assessment Questions 1. BLOOD PRESSURE: What is the blood pressure? Did you take at least two measurements 5 minutes apart?     161/98  2. ONSET: When did you take your blood pressure?     Taken yesterday  3. HOW: How did you take your blood pressure? (e.g., automatic home BP monitor, visiting nurse)     Automatic home BP cuff  4. HISTORY: Do you have a history of high blood pressure?     Yes; was on BP meds but was taken off  5. MEDICINES: Are you taking any medicines for blood pressure? Have you missed any doses recently?     Not taking medication at this  6. OTHER SYMPTOMS: Do you have any symptoms? (e.g., blurred vision, chest pain, difficulty breathing, headache, weakness)     Dizziness  Protocols used: Blood Pressure - High-A-AH

## 2023-08-29 ENCOUNTER — Encounter: Payer: Self-pay | Admitting: Primary Care

## 2023-08-29 ENCOUNTER — Ambulatory Visit (INDEPENDENT_AMBULATORY_CARE_PROVIDER_SITE_OTHER): Payer: Medicare Other | Admitting: Primary Care

## 2023-08-29 VITALS — BP 136/82 | HR 88 | Temp 98.1°F | Ht 71.0 in | Wt 259.0 lb

## 2023-08-29 DIAGNOSIS — R0989 Other specified symptoms and signs involving the circulatory and respiratory systems: Secondary | ICD-10-CM | POA: Insufficient documentation

## 2023-08-29 DIAGNOSIS — I10 Essential (primary) hypertension: Secondary | ICD-10-CM

## 2023-08-29 DIAGNOSIS — N529 Male erectile dysfunction, unspecified: Secondary | ICD-10-CM | POA: Diagnosis not present

## 2023-08-29 MED ORDER — SILDENAFIL CITRATE 100 MG PO TABS: ORAL_TABLET | ORAL | 0 refills | Status: DC

## 2023-08-29 NOTE — Assessment & Plan Note (Addendum)
 Labile hypertension based on home readings Dietary factors contributing   Reviewed prior cardiology notes and testing, per HPI  Encouraged low sodium/low salt diet Literature provided Salty 6 provided   Monitor BP at home, report with continued elevated readings.  Follow up for annual physical in May  I evaluated patient, was consulted regarding treatment, and agree with assessment and plan per Khali Perella, MSN, FNP student.   Mallie Gaskins, NP-C

## 2023-08-29 NOTE — Patient Instructions (Addendum)
 Work on dietary modifications to decrease your intake of sodium and salt. This can contribute to elevated blood pressure and leg swelling. Tip sheets have been provided.  Follow up in May for annual visit with Polly Brink NP. Please schedule on the way out.

## 2023-08-29 NOTE — Progress Notes (Signed)
 Acute Office Visit  Subjective:     Patient ID: Timothy Sheppard, male    DOB: 1945-12-01, 78 y.o.   MRN: [id number]  Chief Complaint  Patient presents with   Medical Management of Chronic Issues    BP has been fluctuating     HPI Timothy Sheppard is a pleasant 78 year old male with hypertension, CHF, untreated OSA, BPH, prediabetes who presents today to discuss fluctuating BP for 2-3 weeks.  He started checking home BP on 08/27/23 due to lightheadedness and dizziness. He called into nurse triage on 08/27/23 with BP of 161/98, 176/105, 177/103. He uses an arm BP cuff.   Home BP readings: 167/86  156/87  160/90 153/90 147/83 133/82 110/67 144/80 158/82 107/66  He reports his BP fluctuates with diet - when he eats healthier options, his BP decreases.   He reports daily consumption of combination of country ham, sausage, bacon, biscuits, eggs. Peanut butter - on sandwiches and in coffee.   He recently started smoking his pipe about 2-3 weeks ago.  He denies chest pain, palpitations, shortness of breath, PND/orthopnea, lower extremity edema, headaches, vision changes.   His med list is up to date he is compliant with current medications.   He had previously been followed by cardiology and is on no current antihypertensives. Last visit 01/2022 for pre-op eval. Lexiscan  myoview  with LVEF 55%, low risk, unchanged from prior study in 2015.   Prior echo 04/2013:  LVEF 50-55%, LVH, mild MR, mild LA dilation  Previous meds: Hydrochlorothiazide  25mg   Lisinopril  10mg  and 20mg   Losartan -hydrochlorothiazide  50-12.5mg  Metoprolol  succinate 50mg    BP Readings from Last 3 Encounters:  08/29/23 136/82  03/11/23 136/70  11/20/22 124/82    Review of Systems  Eyes:  Negative for blurred vision.  Respiratory:  Negative for shortness of breath.   Cardiovascular:  Negative for chest pain, palpitations and leg swelling.  Musculoskeletal:  Positive for back pain and joint pain.       Arthritis  in arms bilaterally; feet hurt daily   Neurological:  Positive for dizziness. Negative for headaches.       Objective:    BP 136/82   Pulse 88   Temp 98.1 F (36.7 C) (Temporal)   Ht 5' 11 (1.803 m)   Wt 117.5 kg   SpO2 98%   BMI 36.12 kg/m    Physical Exam Constitutional:      General: He is not in acute distress.    Appearance: Normal appearance.  Cardiovascular:     Rate and Rhythm: Normal rate and regular rhythm.     Pulses: Normal pulses.          Radial pulses are 2+ on the right side and 2+ on the left side.       Posterior tibial pulses are 2+ on the right side and 2+ on the left side.     Heart sounds: Normal heart sounds.  Pulmonary:     Effort: Pulmonary effort is normal.     Breath sounds: Normal breath sounds.  Musculoskeletal:     Right lower leg: No edema.     Left lower leg: No edema.  Skin:    General: Skin is warm and dry.  Neurological:     General: No focal deficit present.     Mental Status: He is alert and oriented to person, place, and time.  Psychiatric:        Mood and Affect: Mood normal.        Behavior: Behavior  normal.        Assessment & Plan:   Problem List Items Addressed This Visit       Cardiovascular and Mediastinum   HTN (hypertension)   Labile hypertension based on home readings Dietary factors contributing   Reviewed prior cardiology notes and testing, per HPI  Encouraged low sodium/low salt diet Literature provided Salty 6 provided   Monitor BP at home  Follow up for annual physical in May      Relevant Medications   sildenafil  (VIAGRA ) 100 MG tablet     Other   Erectile dysfunction - Primary   Relevant Medications   sildenafil  (VIAGRA ) 100 MG tablet    Meds ordered this encounter  Medications   sildenafil  (VIAGRA ) 100 MG tablet    Sig: Take 1 tablet by mouth 30 minutes prior to sexual activity.    Dispense:  30 tablet    Refill:  0    Supervising Provider:   AVELINA NO E [[id number]]     Follow  up in May for annual physical exam.    Andriette CHRISTELLA Eke, RN

## 2023-08-29 NOTE — Progress Notes (Signed)
 Subjective:    Patient ID: Timothy Sheppard, male    DOB: 03/18/1946, 78 y.o.   MRN: [id number]  HPI  Timothy Sheppard is a very pleasant 78 y.o. male with a history of hypertension, CHF, sleep apnea, BPH, prediabetes, hyperlipidemia who presents today to discuss hypertension.  He called our office two days ago with reports of elevated home BP readings of 161/98, 176/105, 177/103. He recently began checking BP readings due to lightheadedness he experienced while walking downstairs at home.   Other home BP readings are 147/83, 133/82, 110/67, 144/80, 158/82, 107/66. He has untreated sleep apnea, could not tolerate CPAP. He has been eating country ham, sausage, biscuits, bacon, eggs, peanut butter daily.   Previously following with cardiology and managed on hydrochlorothiazide  25 mg, lisinopril  10 and 20 mg doses, metoprolol  succinate 50 mg daily, and losartan -hydrochlorothiazide  50-12.5 mg daily all at varying times over the years.  He has been off of treatment since 2020 or 2021.  He underwent myocardial perfusion imaging in 2023 which was low risk.   He would like a refill of his sildenafil  100 mg tablets.   BP Readings from Last 3 Encounters:  08/29/23 136/82  03/11/23 136/70  11/20/22 124/82       Review of Systems  Constitutional:  Negative for fatigue.  Respiratory:  Negative for shortness of breath.   Cardiovascular:  Negative for chest pain and leg swelling.  Neurological:  Negative for dizziness and headaches.         Past Medical History:  Diagnosis Date   Acute pain of left shoulder 08/29/2020   Allergic rhinitis 08/25/2015   Arthritis    Basal cell carcinoma 05/13/2019   Drosum nose supratip - ED&C    BPH (benign prostatic hyperplasia) 07/19/2013   Mildly uniformly enlarged on exam 07/19/13  Lab Results  Component Value Date   PSA 0.30 07/19/2013      Chest pain 04/25/2013   CHF (congestive heart failure) (HCC)    pt denies   Chronic hip pain    Right    Chronic pain of right lower extremity 11/27/2017   Contact dermatitis due to plant 01/23/2018   DOE (dyspnea on exertion) 01/23/2022   Erectile dysfunction    Fatigue 09/04/2018   History of kidney stones    HTN (hypertension)    Hyperlipidemia    Hypertension    Iritis    Iron deficiency anemia 09/28/2021   Neuropathy    Palpitations 05/03/2013   Poison ivy dermatitis 12/30/2019   Prediabetes 06/11/2018   Preoperative clearance 12/21/2021   Preventative health care 06/11/2018   Sleep apnea     Social History   Socioeconomic History   Marital status: Married    Spouse name: Not on file   Number of children: Not on file   Years of education: Not on file   Highest education level: Not on file  Occupational History   Not on file  Tobacco Use   Smoking status: Former    Types: Pipe   Smokeless tobacco: Former   Tobacco comments:    Quit 1 mo ago  Advertising Account Planner   Vaping status: Never Used  Substance and Sexual Activity   Alcohol use: No   Drug use: No   Sexual activity: Not on file  Other Topics Concern   Not on file  Social History Narrative   ** Merged History Encounter **       Married. 2 children, 2 grandchildren, 2 great grandchilden. Retired.  Social Drivers of Corporate Investment Banker Strain: Low Risk  (12/24/2022)   Overall Financial Resource Strain (CARDIA)    Difficulty of Paying Living Expenses: Not hard at all  Food Insecurity: No Food Insecurity (12/24/2022)   Hunger Vital Sign    Worried About Running Out of Food in the Last Year: Never true    Ran Out of Food in the Last Year: Never true  Transportation Needs: No Transportation Needs (12/24/2022)   PRAPARE - Administrator, Civil Service (Medical): No    Lack of Transportation (Non-Medical): No  Physical Activity: Sufficiently Active (12/24/2022)   Exercise Vital Sign    Days of Exercise per Week: 7 days    Minutes of Exercise per Session: 30 min  Stress: No Stress Concern Present  (12/24/2022)   [employer] of Occupational Health - Occupational Stress Questionnaire    Feeling of Stress : Not at all  Social Connections: Socially Integrated (12/24/2022)   Social Connection and Isolation Panel [NHANES]    Frequency of Communication with Friends and Family: More than three times a week    Frequency of Social Gatherings with Friends and Family: More than three times a week    Attends Religious Services: More than 4 times per year    Active Member of [employer] or Organizations: Yes    Attends Engineer, Structural: More than 4 times per year    Marital Status: Married  Catering Manager Violence: Not At Risk (12/24/2022)   Humiliation, Afraid, Rape, and Kick questionnaire    Fear of Current or Ex-Partner: No    Emotionally Abused: No    Physically Abused: No    Sexually Abused: No    Past Surgical History:  Procedure Laterality Date   BACK SURGERY  2000   HERNIA REPAIR     ORIF FEMUR FRACTURE Right 1967   rod insertion   TONSILLECTOMY AND ADENOIDECTOMY     TOTAL HIP ARTHROPLASTY Right 01/28/2022   Procedure: RIGHT TOTAL HIP ARTHROPLASTY POSTERIOR, REMOVAL OF K NAIL;  Surgeon: Liam Lerner, MD;  Location: [facility];  Service: Orthopedics;  Laterality: Right;    Family History  Problem Relation Age of Onset   Heart failure Mother    Lupus Mother    Heart failure Father     No Known Allergies  Current Outpatient Medications on File Prior to Visit  Medication Sig Dispense Refill   aspirin  EC 81 MG tablet Take 1 tablet (81 mg total) by mouth 2 (two) times daily. 60 tablet 0   diclofenac  Sodium (VOLTAREN ) 1 % GEL Apply 2 g topically 3 (three) times daily as needed. For pain. 100 g 0   ferrous sulfate  325 (65 FE) MG tablet Take 650 mg by mouth daily with breakfast.     fluticasone  (FLONASE ) 50 MCG/ACT nasal spray Place 1 spray into both nostrils daily as needed for allergies or rhinitis.     furosemide  (LASIX ) 20 MG tablet Take 1 tablet (20 mg total) by mouth  daily. For leg swelling 90 tablet 2   gabapentin  (NEURONTIN ) 300 MG capsule TAKE 1 CAPSULE IN THE MORNING AND 3 CAPS AT BEDTIME FOR PAIN 360 capsule 2   predniSONE  (DELTASONE ) 20 MG tablet Take two tablets po qd for five days, one tablet po qd for five days, then 1/2 tablet po qd for five days 18 tablet 0   tamsulosin  (FLOMAX ) 0.4 MG CAPS capsule TAKE 1 CAPSULE BY MOUTH IN THE MORNING FOR  URINE  STREAM 90 capsule 2   No current facility-administered medications on file prior to visit.    BP 136/82   Pulse 88   Temp 98.1 F (36.7 C) (Temporal)   Ht 5' 11 (1.803 m)   Wt 259 lb (117.5 kg)   SpO2 98%   BMI 36.12 kg/m  Objective:   Physical Exam Cardiovascular:     Rate and Rhythm: Normal rate and regular rhythm.  Pulmonary:     Effort: Pulmonary effort is normal.     Breath sounds: Normal breath sounds.  Musculoskeletal:     Cervical back: Neck supple.  Skin:    General: Skin is warm and dry.  Neurological:     Mental Status: He is alert and oriented to person, place, and time.  Psychiatric:        Mood and Affect: Mood normal.           Assessment & Plan:  Erectile dysfunction, unspecified erectile dysfunction type -     Sildenafil  Citrate; Take 1 tablet by mouth 30 minutes prior to sexual activity.  Dispense: 30 tablet; Refill: 0  Primary hypertension Assessment & Plan: Labile hypertension based on home readings Dietary factors contributing   Reviewed prior cardiology notes and testing, per HPI  Encouraged low sodium/low salt diet Literature provided Salty 6 provided   Monitor BP at home, report with continued elevated readings.  Follow up for annual physical in May  I evaluated patient, was consulted regarding treatment, and agree with assessment and plan per Jenna Elkins, MSN, FNP student.   Mallie Gaskins, NP-C          [place] MARLA Gaskins, NP

## 2023-09-02 ENCOUNTER — Ambulatory Visit: Payer: Medicare Other | Admitting: Primary Care

## 2023-10-05 ENCOUNTER — Other Ambulatory Visit: Payer: Self-pay | Admitting: Primary Care

## 2023-10-05 DIAGNOSIS — R600 Localized edema: Secondary | ICD-10-CM

## 2023-10-05 NOTE — Telephone Encounter (Signed)
 Patient is due for CPE/follow up in mid May, this will be required prior to any further refills.  Please schedule, thank you!

## 2023-10-06 ENCOUNTER — Telehealth: Payer: Self-pay | Admitting: Primary Care

## 2023-10-06 DIAGNOSIS — G629 Polyneuropathy, unspecified: Secondary | ICD-10-CM

## 2023-10-06 MED ORDER — GABAPENTIN 300 MG PO CAPS
ORAL_CAPSULE | ORAL | 0 refills | Status: DC
Start: 2023-10-06 — End: 2023-11-27

## 2023-10-06 NOTE — Addendum Note (Signed)
 Addended by: Eden Emms on: 10/06/2023 01:39 PM   Modules accepted: Orders

## 2023-10-06 NOTE — Telephone Encounter (Signed)
 Patient is due for CPE/follow up in mid May, this will be required prior to any further refills.  Please schedule, thank you!

## 2023-10-06 NOTE — Telephone Encounter (Signed)
 Prescription Request  10/06/2023  LOV: 08/29/2023  What is the name of the medication or equipment? gabapentin (NEURONTIN) 300 MG capsule   Have you contacted your pharmacy to request a refill? No   Which pharmacy would you like this sent to?  [id number], [place] - [address] [place] [address] Phone: [phone] Fax: [phone]    Patient notified that their request is being sent to the clinical staff for review and that they should receive a response within 2 business days.   Please advise at Mobile [phone] (mobile)

## 2023-10-06 NOTE — Telephone Encounter (Signed)
 Patient will cb when he get home to schedule.

## 2023-10-06 NOTE — Telephone Encounter (Signed)
 Refill provided

## 2023-10-07 NOTE — Telephone Encounter (Signed)
 Spoke to pt's wife, scheduled cpe for 11/27/23

## 2023-10-30 ENCOUNTER — Ambulatory Visit: Payer: Medicare Other | Admitting: Primary Care

## 2023-11-25 ENCOUNTER — Ambulatory Visit

## 2023-11-25 VITALS — BP 136/82 | Ht 71.0 in | Wt 255.0 lb

## 2023-11-25 DIAGNOSIS — Z0000 Encounter for general adult medical examination without abnormal findings: Secondary | ICD-10-CM

## 2023-11-25 NOTE — Patient Instructions (Signed)
 Timothy Sheppard , Thank you for taking time to come for your Medicare Wellness Visit. I appreciate your ongoing commitment to your health goals. Please review the following plan we discussed and let me know if I can assist you in the future.   Referrals/Orders/Follow-Ups/Clinician Recommendations: follow up as scheduled for next AWV.  This is a list of the screening recommended for you and due dates:  Health Maintenance  Topic Date Due   Zoster (Shingles) Vaccine (1 of 2) 11/26/2023*   COVID-19 Vaccine (1) 12/24/2023*   Flu Shot  02/20/2024   DTaP/Tdap/Td vaccine (2 - Td or Tdap) 05/11/2024   Cologuard (Stool DNA test)  10/05/2024   Medicare Annual Wellness Visit  11/24/2024   Pneumonia Vaccine  Completed   Hepatitis C Screening  Completed   HPV Vaccine  Aged Out   Meningitis B Vaccine  Aged Out  *Topic was postponed. The date shown is not the original due date.    Advanced directives: (Copy Requested) Please bring a copy of your health care power of attorney and living will to the office to be added to your chart at your convenience. You can mail to [facility] [address] [place], [place] [address] or email to [email or web address]  Next Medicare Annual Wellness Visit scheduled for next year: Yes  Have you seen your provider in the last 6 months (3 months if uncontrolled diabetes)? Yes

## 2023-11-25 NOTE — Progress Notes (Addendum)
 Because this visit was a virtual/telehealth visit,  certain criteria was not obtained, such a blood pressure, CBG if applicable, and timed get up and go. Any medications not marked as "taking" were not mentioned during the medication reconciliation part of the visit. Any vitals not documented were not able to be obtained due to this being a telehealth visit or patient was unable to self-report a recent blood pressure reading due to a lack of equipment at home via telehealth. Vitals that have been documented are verbally provided by the patient.  Subjective:   Timothy Sheppard is a 78 y.o. who presents for a Medicare Wellness preventive visit.  Visit Complete: Virtual I connected with  Timothy Sheppard on 11/25/23 by a audio enabled telemedicine application and verified that I am speaking with the correct person using two identifiers.  Patient Location: Home  Provider Location: Home Office  I discussed the limitations of evaluation and management by telemedicine. The patient expressed understanding and agreed to proceed.  Vital Signs: Because this visit was a virtual/telehealth visit, some criteria may be missing or patient reported. Any vitals not documented were not able to be obtained and vitals that have been documented are patient reported.  VideoDeclined- This patient declined Librarian, academic. Therefore the visit was completed with audio only.  Persons Participating in Visit: Patient.  AWV Questionnaire: No: Patient Medicare AWV questionnaire was not completed prior to this visit.  Cardiac Risk Factors include: advanced age (>65men, >18 women);male gender;hypertension;obesity (BMI >30kg/m2);dyslipidemia;Other (see comment), Risk factor comments: CHF     Objective:    Today's Vitals   11/25/23 0904  BP: 136/82  Weight: 255 lb (115.7 kg)  Height: 5\' 11"  (1.803 m)   Body mass index is 35.57 kg/m.     11/25/2023    9:11 AM 12/24/2022    9:40 AM  01/28/2022   12:15 PM 01/17/2022    8:17 AM 12/06/2021    2:05 PM 05/08/2020   10:39 AM 11/02/2019    3:32 PM  Advanced Directives  Does Patient Have a Medical Advance Directive? Yes Yes Yes Yes Yes No Yes  Type of Estate agent of [place];Living will Healthcare Power of [place];Living will Healthcare Power of [place];Living will Healthcare Power of [place];Living will Healthcare Power of [place];Living will  Healthcare Power of [place];Living will  Does patient want to make changes to medical advance directive? No - Patient declined  No - Guardian declined  No - Patient declined    Copy of Healthcare Power of Attorney in Chart? No - copy requested No - copy requested No - copy requested  No - copy requested  No - copy requested  Would patient like information on creating a medical advance directive?      No - Patient declined     Current Medications (verified) Outpatient Encounter Medications as of 11/25/2023  Medication Sig   aspirin  EC 81 MG tablet Take 1 tablet (81 mg total) by mouth 2 (two) times daily.   diclofenac  Sodium (VOLTAREN ) 1 % GEL Apply 2 g topically 3 (three) times daily as needed. For pain.   ferrous sulfate  325 (65 FE) MG tablet Take 650 mg by mouth daily with breakfast.   fluticasone  (FLONASE ) 50 MCG/ACT nasal spray Place 1 spray into both nostrils daily as needed for allergies or rhinitis.   furosemide  (LASIX ) 20 MG tablet TAKE 1 TABLET (20 MG TOTAL) BY MOUTH DAILY. FOR LEG SWELLING   gabapentin  (NEURONTIN ) 300 MG capsule TAKE  1 CAPSULE IN THE MORNING AND 3 CAPS AT BEDTIME FOR PAIN   sildenafil  (VIAGRA ) 100 MG tablet Take 1 tablet by mouth 30 minutes prior to sexual activity.   tamsulosin  (FLOMAX ) 0.4 MG CAPS capsule TAKE 1 CAPSULE BY MOUTH IN THE MORNING FOR  URINE  STREAM   predniSONE  (DELTASONE ) 20 MG tablet Take two tablets po qd for five days, one tablet po qd for five days, then 1/2 tablet po qd for five days (Patient not taking: Reported on  11/25/2023)   No facility-administered encounter medications on file as of 11/25/2023.    Allergies (verified) Patient has no known allergies.   History: Past Medical History:  Diagnosis Date   Acute pain of left shoulder 08/29/2020   Allergic rhinitis 08/25/2015   Arthritis    Basal cell carcinoma 05/13/2019   Drosum nose supratip - ED&C    BPH (benign prostatic hyperplasia) 07/19/2013   Mildly uniformly enlarged on exam 07/19/13  Lab Results  Component Value Date   PSA 0.30 07/19/2013      Chest pain 04/25/2013   CHF (congestive heart failure) (HCC)    pt denies   Chronic hip pain    Right   Chronic pain of right lower extremity 11/27/2017   Contact dermatitis due to plant 01/23/2018   DOE (dyspnea on exertion) 01/23/2022   Erectile dysfunction    Fatigue 09/04/2018   History of kidney stones    HTN (hypertension)    Hyperlipidemia    Hypertension    Iritis    Iron deficiency anemia 09/28/2021   Neuropathy    Palpitations 05/03/2013   Poison ivy dermatitis 12/30/2019   Prediabetes 06/11/2018   Preoperative clearance 12/21/2021   Preventative health care 06/11/2018   Sleep apnea    Past Surgical History:  Procedure Laterality Date   BACK SURGERY  2000   HERNIA REPAIR     ORIF FEMUR FRACTURE Right 1967   rod insertion   TONSILLECTOMY AND ADENOIDECTOMY     TOTAL HIP ARTHROPLASTY Right 01/28/2022   Procedure: RIGHT TOTAL HIP ARTHROPLASTY POSTERIOR, REMOVAL OF K NAIL;  Surgeon: Wendolyn Hamburger, MD;  Location: [facility];  Service: Orthopedics;  Laterality: Right;   Family History  Problem Relation Age of Onset   Heart failure Mother    Lupus Mother    Heart failure Father    Social History   Socioeconomic History   Marital status: Married    Spouse name: Not on file   Number of children: Not on file   Years of education: Not on file   Highest education level: Not on file  Occupational History   Not on file  Tobacco Use   Smoking status: Former    Types: Pipe    Smokeless tobacco: Former   Tobacco comments:    Quit 1 mo ago  Advertising account planner   Vaping status: Never Used  Substance and Sexual Activity   Alcohol use: No   Drug use: No   Sexual activity: Not on file  Other Topics Concern   Not on file  Social History Narrative   ** Merged History Encounter **       Married. 2 children, 2 grandchildren, 2 great grandchilden. Retired.     Social Drivers of Corporate investment banker Strain: Low Risk  (11/25/2023)   Overall Financial Resource Strain (CARDIA)    Difficulty of Paying Living Expenses: Not hard at all  Food Insecurity: No Food Insecurity (11/25/2023)   Hunger Vital Sign  Worried About Programme researcher, broadcasting/film/video in the Last Year: Never true    Ran Out of Food in the Last Year: Never true  Transportation Needs: No Transportation Needs (11/25/2023)   PRAPARE - Administrator, Civil Service (Medical): No    Lack of Transportation (Non-Medical): No  Physical Activity: Insufficiently Active (11/25/2023)   Exercise Vital Sign    Days of Exercise per Week: 7 days    Minutes of Exercise per Session: 20 min  Stress: No Stress Concern Present (11/25/2023)   [employer] of Occupational Health - Occupational Stress Questionnaire    Feeling of Stress : Not at all  Social Connections: Moderately Integrated (11/25/2023)   Social Connection and Isolation Panel [NHANES]    Frequency of Communication with Friends and Family: More than three times a week    Frequency of Social Gatherings with Friends and Family: More than three times a week    Attends Religious Services: More than 4 times per year    Active Member of [employer] or Organizations: No    Attends Engineer, structural: Never    Marital Status: Married    Tobacco Counseling Counseling given: Not Answered Tobacco comments: Quit 1 mo ago    Clinical Intake:  Pre-visit preparation completed: Yes  Pain : No/denies pain     BMI - recorded: 35.57 Nutritional Status: BMI  > 30  Obese Nutritional Risks: None Diabetes: No  Lab Results  Component Value Date   HGBA1C 5.3 11/20/2022   HGBA1C 5.6 09/28/2021   HGBA1C 5.6 08/29/2020     How often do you need to have someone help you when you read instructions, pamphlets, or other written materials from your doctor or pharmacy?: 1 - Never What is the last grade level you completed in school?: HS graduate  Interpreter Needed?: No  Information entered by :: Juliann Ochoa   Activities of Daily Living     11/25/2023    9:09 AM 12/24/2022    9:42 AM  In your present state of health, do you have any difficulty performing the following activities:  Hearing? 0 0  Vision? 0 0  Difficulty concentrating or making decisions? 0 0  Walking or climbing stairs? 0 0  Dressing or bathing? 0 0  Doing errands, shopping? 0 0  Preparing Food and eating ? N N  Using the Toilet? N N  In the past six months, have you accidently leaked urine? N N  Do you have problems with loss of bowel control? N N  Managing your Medications? N N  Managing your Finances? N N  Housekeeping or managing your Housekeeping? N N    Patient Care Team: Gabriel John, NP as PCP - General (Internal Medicine)  Indicate any recent Medical Services you may have received from other than [facility] providers in the past year (date may be approximate).     Assessment:   This is a routine wellness examination for Timothy Sheppard.  Hearing/Vision screen Hearing Screening - Comments:: No difficulties hearing Vision Screening - Comments:: No vision difficulties   Goals Addressed             This Visit's Progress    Patient Stated       Patient goal is to see Jesus Christ.       Depression Screen     11/25/2023    9:12 AM 08/29/2023    2:19 PM 03/11/2023    7:38 AM 12/24/2022    9:35 AM 12/06/2021  2:01 PM 11/02/2019    3:35 PM 06/08/2018   11:31 AM  PHQ 2/9 Scores  PHQ - 2 Score 0 0 0 0 0 0 0  PHQ- 9 Score 3     0 0    Fall Risk      11/25/2023    9:10 AM 08/29/2023    2:19 PM 03/11/2023    7:37 AM 12/24/2022    9:41 AM 11/20/2022    8:34 AM  Fall Risk   Falls in the past year? 0 0 0 0 0  Number falls in past yr: 0 0 0 0 0  Injury with Fall? 0 0 0 0 0  Risk for fall due to : No Fall Risks No Fall Risks No Fall Risks No Fall Risks No Fall Risks  Follow up Falls prevention discussed;Falls evaluation completed Falls evaluation completed Falls evaluation completed Falls prevention discussed;Falls evaluation completed Falls evaluation completed    MEDICARE RISK AT HOME:  Medicare Risk at Home Any stairs in or around the home?: Yes If so, are there any without handrails?: No Home free of loose throw rugs in walkways, pet beds, electrical cords, etc?: Yes Adequate lighting in your home to reduce risk of falls?: Yes Life alert?: No Use of a cane, walker or w/c?: Yes (cane) Grab bars in the bathroom?: Yes Shower chair or bench in shower?: Yes Elevated toilet seat or a handicapped toilet?: Yes  TIMED UP AND GO:  Was the test performed?  No  Cognitive Function: 6CIT completed    11/02/2019    3:38 PM 06/08/2018   11:31 AM  MMSE - Mini Mental State Exam  Orientation to time 5 5  Orientation to Place 5 5  Registration 3 3  Attention/ Calculation 5 0  Recall 3 3  Language- name 2 objects  0  Language- repeat 1 1  Language- follow 3 step command  3  Language- read & follow direction  0  Write a sentence  0  Copy design  0  Total score  20        11/25/2023    9:06 AM 12/24/2022    9:36 AM 12/06/2021    2:05 PM  6CIT Screen  What Year? 0 points 0 points 0 points  What month? 0 points 0 points 0 points  What time? 0 points 0 points 0 points  Count back from 20 0 points 0 points 0 points  Months in reverse 0 points 0 points 0 points  Repeat phrase 0 points 0 points 0 points  Total Score 0 points 0 points 0 points    Immunizations Immunization History  Administered Date(s) Administered   Fluad Quad(high Dose  65+) 05/03/2020   Influenza,inj,Quad PF,6+ Mos 04/21/2013, 04/11/2015, 04/02/2018   Influenza-Unspecified 04/14/2017   Pneumococcal Conjugate-13 06/09/2017   Pneumococcal Polysaccharide-23 06/08/2018   Tdap 05/11/2014    Screening Tests Health Maintenance  Topic Date Due   Zoster Vaccines- Shingrix (1 of 2) 11/26/2023 (Originally 02/07/1965)   COVID-19 Vaccine (1) 12/24/2023 (Originally 02/06/1951)   INFLUENZA VACCINE  02/20/2024   DTaP/Tdap/Td (2 - Td or Tdap) 05/11/2024   Fecal DNA (Cologuard)  10/05/2024   Medicare Annual Wellness (AWV)  11/24/2024   Pneumonia Vaccine 88+ Years old  Completed   Hepatitis C Screening  Completed   HPV VACCINES  Aged Out   Meningococcal B Vaccine  Aged Out    Health Maintenance  There are no preventive care reminders to display for this patient. Health Maintenance  Items Addressed:up to date  Additional Screening:  Vision Screening: Recommended annual ophthalmology exams for early detection of glaucoma and other disorders of the eye.  Dental Screening: Recommended annual dental exams for proper oral hygiene  Community Resource Referral / Chronic Care Management: CRR required this visit?  No   CCM required this visit?  No     Plan:     I have personally reviewed and noted the following in the patient's chart:   Medical and social history Use of alcohol, tobacco or illicit drugs  Current medications and supplements including opioid prescriptions. Patient is not currently taking opioid prescriptions. Functional ability and status Nutritional status Physical activity Advanced directives List of other physicians Hospitalizations, surgeries, and ER visits in previous 12 months Vitals Screenings to include cognitive, depression, and falls Referrals and appointments  In addition, I have reviewed and discussed with patient certain preventive protocols, quality metrics, and best practice recommendations. A written personalized care plan  for preventive services as well as general preventive health recommendations were provided to patient.     Freeda Jerry, [place]   11/25/2023   After Visit Summary: (MyChart) Due to this being a telephonic visit, the after visit summary with patients personalized plan was offered to patient via MyChart   Notes: Nothing significant to report at this time.

## 2023-11-27 ENCOUNTER — Encounter: Payer: Self-pay | Admitting: Primary Care

## 2023-11-27 ENCOUNTER — Telehealth: Payer: Self-pay | Admitting: Primary Care

## 2023-11-27 ENCOUNTER — Ambulatory Visit (INDEPENDENT_AMBULATORY_CARE_PROVIDER_SITE_OTHER)
Admission: RE | Admit: 2023-11-27 | Discharge: 2023-11-27 | Disposition: A | Discharge status: Home / Self Care | Source: Ambulatory Visit | Attending: Primary Care | Admitting: Primary Care

## 2023-11-27 ENCOUNTER — Ambulatory Visit: Admitting: Primary Care

## 2023-11-27 VITALS — BP 126/74 | HR 62 | Temp 97.0°F | Ht 71.0 in | Wt 258.0 lb

## 2023-11-27 DIAGNOSIS — M5441 Lumbago with sciatica, right side: Secondary | ICD-10-CM

## 2023-11-27 DIAGNOSIS — E785 Hyperlipidemia, unspecified: Secondary | ICD-10-CM | POA: Diagnosis not present

## 2023-11-27 DIAGNOSIS — N400 Enlarged prostate without lower urinary tract symptoms: Secondary | ICD-10-CM

## 2023-11-27 DIAGNOSIS — M419 Scoliosis, unspecified: Secondary | ICD-10-CM | POA: Diagnosis not present

## 2023-11-27 DIAGNOSIS — R7303 Prediabetes: Secondary | ICD-10-CM

## 2023-11-27 DIAGNOSIS — G629 Polyneuropathy, unspecified: Secondary | ICD-10-CM | POA: Diagnosis not present

## 2023-11-27 DIAGNOSIS — I509 Heart failure, unspecified: Secondary | ICD-10-CM | POA: Diagnosis not present

## 2023-11-27 DIAGNOSIS — G8929 Other chronic pain: Secondary | ICD-10-CM | POA: Diagnosis not present

## 2023-11-27 DIAGNOSIS — M5442 Lumbago with sciatica, left side: Secondary | ICD-10-CM | POA: Diagnosis not present

## 2023-11-27 DIAGNOSIS — Z125 Encounter for screening for malignant neoplasm of prostate: Secondary | ICD-10-CM | POA: Diagnosis not present

## 2023-11-27 DIAGNOSIS — G4730 Sleep apnea, unspecified: Secondary | ICD-10-CM

## 2023-11-27 DIAGNOSIS — I10 Essential (primary) hypertension: Secondary | ICD-10-CM

## 2023-11-27 DIAGNOSIS — Z0001 Encounter for general adult medical examination with abnormal findings: Secondary | ICD-10-CM | POA: Diagnosis not present

## 2023-11-27 DIAGNOSIS — M47816 Spondylosis without myelopathy or radiculopathy, lumbar region: Secondary | ICD-10-CM | POA: Diagnosis not present

## 2023-11-27 LAB — COMPREHENSIVE METABOLIC PANEL WITH GFR
ALT: 6 U/L (ref 0–53)
AST: 12 U/L (ref 0–37)
Albumin: 4.4 g/dL (ref 3.5–5.2)
Alkaline Phosphatase: 46 U/L (ref 39–117)
BUN: 17 mg/dL (ref 6–23)
CO2: 30 meq/L (ref 19–32)
Calcium: 9.1 mg/dL (ref 8.4–10.5)
Chloride: 103 meq/L (ref 96–112)
Creatinine, Ser: 0.9 mg/dL (ref 0.40–1.50)
GFR: 83.37 mL/min (ref >60.00)
Glucose, Bld: 87 mg/dL (ref 70–99)
Potassium: 4.4 meq/L (ref 3.5–5.1)
Sodium: 140 meq/L (ref 135–145)
Total Bilirubin: 0.8 mg/dL (ref 0.2–1.2)
Total Protein: 7.1 g/dL (ref 6.0–8.3)

## 2023-11-27 LAB — LIPID PANEL
Cholesterol: 177 mg/dL (ref 0–200)
HDL: 46.4 mg/dL (ref >39.00)
LDL Cholesterol: 116 mg/dL — ABNORMAL HIGH (ref 0–99)
NonHDL: 130.43
Total CHOL/HDL Ratio: 4
Triglycerides: 73 mg/dL (ref 0.0–149.0)
VLDL: 14.6 mg/dL (ref 0.0–40.0)

## 2023-11-27 LAB — HEMOGLOBIN A1C: Hgb A1c MFr Bld: 5.5 % (ref 4.6–6.5)

## 2023-11-27 LAB — PSA, MEDICARE: PSA: 0.22 ng/mL (ref 0.10–4.00)

## 2023-11-27 MED ORDER — GABAPENTIN 300 MG PO CAPS: ORAL_CAPSULE | ORAL | 3 refills | Status: AC

## 2023-11-27 NOTE — Assessment & Plan Note (Signed)
 No alarm signs during exam or HPI.  Discussed options for treatment. Obtain plain films of the lumbar spine today.  Will increase gabapentin  accordingly. Referral placed for outpatient physical therapy.

## 2023-11-27 NOTE — Progress Notes (Signed)
 Subjective:    Patient ID: Timothy Sheppard, male    DOB: 08-Aug-1945, 78 y.o.   MRN: [id number]  HPI  Timothy Sheppard is a very pleasant 78 y.o. male who presents today for complete physical and follow up of chronic conditions.  He would also like to discuss imbalance, back pain, and neuropathy. He is currently managed on gabapentin  300 mg in AM and 900 mg HS. He notices most of his neuropathy during the afternoon. He denies drowsiness during the day. He questions if we can increase his dose.    He continues to notice bilateral back pain and bilateral buttock pain with radiation of pain down to his toes. Symptoms have progressed over the years, has now kept him from doing things. He has not completed physical therapy.  No recent lumbar x-ray on file.  He has developed poor posture and balance because of his pain.  Immunizations: -Tetanus: Completed in 2015 -Shingles: Never completed  -Pneumonia: Completed Prevnar 13 in 2018, Pneumovax 23 in 2019  Diet: Fair diet.  Exercise: No regular exercise.  Eye exam: Completes annually  Dental exam: Completes semi-annually    Colonoscopy: Completed Cologuard in 2023, negative.   PSA: Due  BP Readings from Last 3 Encounters:  11/27/23 126/74  11/25/23 136/82  08/29/23 136/82    Wt Readings from Last 3 Encounters:  11/27/23 258 lb (117 kg)  11/25/23 255 lb (115.7 kg)  08/29/23 259 lb (117.5 kg)        Review of Systems  Constitutional:  Negative for unexpected weight change.  HENT:  Negative for rhinorrhea.   Respiratory:  Negative for cough and shortness of breath.   Cardiovascular:  Negative for chest pain.  Gastrointestinal:  Negative for constipation and diarrhea.  Genitourinary:  Negative for difficulty urinating.  Musculoskeletal:  Positive for arthralgias and back pain.  Skin:  Negative for rash.  Allergic/Immunologic: Negative for environmental allergies.  Neurological:  Positive for numbness. Negative for dizziness  and headaches.  Psychiatric/Behavioral:  The patient is not nervous/anxious.          Past Medical History:  Diagnosis Date   Acute pain of left shoulder 08/29/2020   Allergic rhinitis 08/25/2015   Arthritis    Basal cell carcinoma 05/13/2019   Drosum nose supratip - ED&C    BPH (benign prostatic hyperplasia) 07/19/2013   Mildly uniformly enlarged on exam 07/19/13  Lab Results  Component Value Date   PSA 0.30 07/19/2013      Chest pain 04/25/2013   CHF (congestive heart failure) (HCC)    pt denies   Chronic hip pain    Right   Chronic pain of right lower extremity 11/27/2017   Contact dermatitis due to plant 01/23/2018   DOE (dyspnea on exertion) 01/23/2022   Erectile dysfunction    Fatigue 09/04/2018   History of kidney stones    HTN (hypertension)    Hyperlipidemia    Hypertension    Iritis    Iron deficiency anemia 09/28/2021   Neuropathy    Palpitations 05/03/2013   Poison ivy dermatitis 12/30/2019   Prediabetes 06/11/2018   Preoperative clearance 12/21/2021   Preventative health care 06/11/2018   Sleep apnea     Social History   Socioeconomic History   Marital status: Married    Spouse name: Not on file   Number of children: Not on file   Years of education: Not on file   Highest education level: Not on file  Occupational History  Not on file  Tobacco Use   Smoking status: Former    Types: Pipe   Smokeless tobacco: Former   Tobacco comments:    Quit 1 mo ago  Advertising account planner   Vaping status: Never Used  Substance and Sexual Activity   Alcohol use: No   Drug use: No   Sexual activity: Not on file  Other Topics Concern   Not on file  Social History Narrative   ** Merged History Encounter **       Married. 2 children, 2 grandchildren, 2 great grandchilden. Retired.     Social Drivers of Corporate investment banker Strain: Low Risk  (11/25/2023)   Overall Financial Resource Strain (CARDIA)    Difficulty of Paying Living Expenses: Not hard at all   Food Insecurity: No Food Insecurity (11/25/2023)   Hunger Vital Sign    Worried About Running Out of Food in the Last Year: Never true    Ran Out of Food in the Last Year: Never true  Transportation Needs: No Transportation Needs (11/25/2023)   PRAPARE - Administrator, Civil Service (Medical): No    Lack of Transportation (Non-Medical): No  Physical Activity: Insufficiently Active (11/25/2023)   Exercise Vital Sign    Days of Exercise per Week: 7 days    Minutes of Exercise per Session: 20 min  Stress: No Stress Concern Present (11/25/2023)   [employer] of Occupational Health - Occupational Stress Questionnaire    Feeling of Stress : Not at all  Social Connections: Moderately Integrated (11/25/2023)   Social Connection and Isolation Panel [NHANES]    Frequency of Communication with Friends and Family: More than three times a week    Frequency of Social Gatherings with Friends and Family: More than three times a week    Attends Religious Services: More than 4 times per year    Active Member of [employer] or Organizations: No    Attends Banker Meetings: Never    Marital Status: Married  Catering manager Violence: Not At Risk (11/25/2023)   Humiliation, Afraid, Rape, and Kick questionnaire    Fear of Current or Ex-Partner: No    Emotionally Abused: No    Physically Abused: No    Sexually Abused: No    Past Surgical History:  Procedure Laterality Date   BACK SURGERY  2000   HERNIA REPAIR     ORIF FEMUR FRACTURE Right 1967   rod insertion   TONSILLECTOMY AND ADENOIDECTOMY     TOTAL HIP ARTHROPLASTY Right 01/28/2022   Procedure: RIGHT TOTAL HIP ARTHROPLASTY POSTERIOR, REMOVAL OF K NAIL;  Surgeon: Wendolyn Hamburger, MD;  Location: [facility];  Service: Orthopedics;  Laterality: Right;    Family History  Problem Relation Age of Onset   Heart failure Mother    Lupus Mother    Heart failure Father     No Known Allergies  Current Outpatient Medications on File Prior to  Visit  Medication Sig Dispense Refill   aspirin  EC 81 MG tablet Take 1 tablet (81 mg total) by mouth 2 (two) times daily. 60 tablet 0   ferrous sulfate  325 (65 FE) MG tablet Take 650 mg by mouth daily with breakfast.     fluticasone  (FLONASE ) 50 MCG/ACT nasal spray Place 1 spray into both nostrils daily as needed for allergies or rhinitis.     furosemide  (LASIX ) 20 MG tablet TAKE 1 TABLET (20 MG TOTAL) BY MOUTH DAILY. FOR LEG SWELLING 90 tablet 0  sildenafil  (VIAGRA ) 100 MG tablet Take 1 tablet by mouth 30 minutes prior to sexual activity. 30 tablet 0   tamsulosin  (FLOMAX ) 0.4 MG CAPS capsule TAKE 1 CAPSULE BY MOUTH IN THE MORNING FOR  URINE  STREAM 90 capsule 2   diclofenac  Sodium (VOLTAREN ) 1 % GEL Apply 2 g topically 3 (three) times daily as needed. For pain. (Patient not taking: Reported on 11/27/2023) 100 g 0   No current facility-administered medications on file prior to visit.    BP 126/74   Pulse 62   Temp (!) 97 F (36.1 C) (Temporal)   Ht 5\' 11"  (1.803 m)   Wt 258 lb (117 kg)   SpO2 98%   BMI 35.98 kg/m  Objective:   Physical Exam HENT:     Right Ear: Tympanic membrane and ear canal normal.     Left Ear: Tympanic membrane and ear canal normal.  Eyes:     Pupils: Pupils are equal, round, and reactive to light.  Cardiovascular:     Rate and Rhythm: Normal rate and regular rhythm.  Pulmonary:     Effort: Pulmonary effort is normal.     Breath sounds: Normal breath sounds.  Abdominal:     General: Bowel sounds are normal.     Palpations: Abdomen is soft.     Tenderness: There is no abdominal tenderness.  Musculoskeletal:     Cervical back: Neck supple.     Lumbar back: Decreased range of motion. Negative right straight leg raise test and negative left straight leg raise test.  Skin:    General: Skin is warm and dry.  Neurological:     Mental Status: He is alert and oriented to person, place, and time.     Cranial Nerves: No cranial nerve deficit.     Deep Tendon  Reflexes:     Reflex Scores:      Patellar reflexes are 2+ on the right side and 2+ on the left side. Psychiatric:        Mood and Affect: Mood normal.           Assessment & Plan:  Encounter for annual general medical examination with abnormal findings in adult Assessment & Plan: Discussed Shingrix vaccines.  Colonoscopy UTD, no further screening needed given age. PSA due and pending.  Discussed the importance of a healthy diet and regular exercise in order for weight loss, and to reduce the risk of further co-morbidity.  Exam stable. Labs pending.  Follow up in 1 year for repeat physical.    Neuropathy Assessment & Plan: Uncontrolled.  Increase gabapentin  300 in AM, 300 mg in afternoon, and 900 mg in PM. Will also refer for outpatient PT.  Orders: -     Gabapentin ; TAKE 1 CAPSULE IN THE MORNING, 1 capsule in the afternoon, AND 3 CAPS AT BEDTIME FOR PAIN  Dispense: 450 capsule; Refill: 3  Congestive heart failure, unspecified HF chronicity, unspecified heart failure type [facility]) Assessment & Plan: Appears euvolemic today. Continue furosemide  20 mg daily.   CMP pending.   Primary hypertension Assessment & Plan: Controlled.  Continue to monitor.   Orders: -     Comprehensive metabolic panel with GFR  Sleep apnea, unspecified type Assessment & Plan: Strongly advised follow up, he declines today.  Discussed long term effects from untreated sleep apnea.    Benign prostatic hyperplasia without lower urinary tract symptoms Assessment & Plan: Controlled.  Continue tamsulosin  0.4 mg daily.   Hyperlipidemia, unspecified hyperlipidemia type Assessment & Plan:  Repeat lipid panel pending.  Remain off treatment.  Orders: -     Lipid panel  Prediabetes Assessment & Plan: Repeat A1c pending.  Orders: -     Hemoglobin A1c  Chronic bilateral low back pain with bilateral sciatica Assessment & Plan: No alarm signs during exam or HPI.  Discussed  options for treatment. Obtain plain films of the lumbar spine today.  Will increase gabapentin  accordingly. Referral placed for outpatient physical therapy.  Orders: -     Ambulatory referral to Physical Therapy -     DG Lumbar Spine 2-3 Views  Screening for prostate cancer -     PSA, Medicare        Gabriel John, NP

## 2023-11-27 NOTE — Assessment & Plan Note (Signed)
Repeat lipid panel pending. Remain off treatment. 

## 2023-11-27 NOTE — Assessment & Plan Note (Signed)
 Strongly advised follow up, he declines today.  Discussed long term effects from untreated sleep apnea.

## 2023-11-27 NOTE — Assessment & Plan Note (Signed)
Controlled.  Continue tamsulosin 0.4 mg daily. 

## 2023-11-27 NOTE — Assessment & Plan Note (Signed)
 Repeat A1c pending

## 2023-11-27 NOTE — Telephone Encounter (Signed)
 Noted  Referral placed.

## 2023-11-27 NOTE — Assessment & Plan Note (Signed)
 Controlled.  Continue to monitor.

## 2023-11-27 NOTE — Assessment & Plan Note (Signed)
 Appears euvolemic today. Continue furosemide  20 mg daily.   CMP pending.

## 2023-11-27 NOTE — Telephone Encounter (Signed)
 Patient notified

## 2023-11-27 NOTE — Telephone Encounter (Signed)
 Copied from CRM [id number]. Topic: Clinical - Medication Question >> Nov 27, 2023  3:05 PM Marlan Silva wrote: Reason for CRM: Patient called in requesting that NP Katherin Clark write him an order to get a back injection as well as a referral to be seen at [facility]. Patient is also requesting a call back for assistance with scheduling that appointment for ortho. Patient can be reached at the mobile number on his chart which is [phone].

## 2023-11-27 NOTE — Addendum Note (Signed)
 Addended by: Hiilani Jetter K on: 11/27/2023 04:13 PM   Modules accepted: Orders

## 2023-11-27 NOTE — Assessment & Plan Note (Signed)
 Uncontrolled.  Increase gabapentin  300 in AM, 300 mg in afternoon, and 900 mg in PM. Will also refer for outpatient PT.

## 2023-11-27 NOTE — Assessment & Plan Note (Signed)
 Discussed Shingrix vaccines.  Colonoscopy UTD, no further screening needed given age. PSA due and pending.  Discussed the importance of a healthy diet and regular exercise in order for weight loss, and to reduce the risk of further co-morbidity.  Exam stable. Labs pending.  Follow up in 1 year for repeat physical.

## 2023-11-27 NOTE — Patient Instructions (Signed)
 Complete xray(s) and labs prior to leaving today. I will notify you of your results once received.  You will either be contacted via phone regarding your referral to physical therapy, or you may receive a letter on your MyChart portal from our referral team with instructions for scheduling an appointment. Please let us  know if you have not been contacted by anyone within two weeks.  We increased your gabapentin .  Continue to take 1 pill in the morning, add 1 pill in the afternoon, continue 3 pills at night.  It was a pleasure to see you today!

## 2023-12-31 ENCOUNTER — Other Ambulatory Visit: Payer: Self-pay | Admitting: Primary Care

## 2023-12-31 DIAGNOSIS — R600 Localized edema: Secondary | ICD-10-CM

## 2024-01-27 DIAGNOSIS — M5416 Radiculopathy, lumbar region: Secondary | ICD-10-CM | POA: Diagnosis not present

## 2024-01-27 DIAGNOSIS — M5442 Lumbago with sciatica, left side: Secondary | ICD-10-CM | POA: Diagnosis not present

## 2024-01-27 DIAGNOSIS — M5441 Lumbago with sciatica, right side: Secondary | ICD-10-CM | POA: Diagnosis not present

## 2024-01-27 DIAGNOSIS — G8929 Other chronic pain: Secondary | ICD-10-CM | POA: Diagnosis not present

## 2024-01-28 ENCOUNTER — Encounter: Payer: Self-pay | Admitting: Primary Care

## 2024-01-28 ENCOUNTER — Ambulatory Visit (INDEPENDENT_AMBULATORY_CARE_PROVIDER_SITE_OTHER): Admitting: Primary Care

## 2024-01-28 ENCOUNTER — Other Ambulatory Visit: Payer: Self-pay | Admitting: Physical Medicine & Rehabilitation

## 2024-01-28 VITALS — BP 166/82 | HR 73 | Temp 97.2°F | Ht 71.0 in | Wt 258.0 lb

## 2024-01-28 DIAGNOSIS — R229 Localized swelling, mass and lump, unspecified: Secondary | ICD-10-CM | POA: Insufficient documentation

## 2024-01-28 DIAGNOSIS — M5442 Lumbago with sciatica, left side: Secondary | ICD-10-CM

## 2024-01-28 NOTE — Patient Instructions (Signed)
 Please allow the scab to heal.  Please notify me if you develop fevers, pain, redness/warmth around the site.  It was a pleasure to see you today!

## 2024-01-28 NOTE — Progress Notes (Signed)
 Subjective:    Patient ID: Timothy Sheppard, male    DOB: [birth year], 78 y.o.   MRN: [id number]  HPI  Timothy Sheppard is a very pleasant 78 y.o. male with a history of CHF, hypertension, chronic low back pain, chronic lower extremity pain, prediabetes, hyperlipidemia who presents today to discuss skin mass.  His skin mass is located to the mid thoracic back which has been present for years. The skin mass will wax and wane in size, develops a head for which his wife will squeeze, sometimes will see puss, other times not. His skin mass is not bothersome unless his wife tries to pop it. His wife recently attempted to pop the mass 2 days ago.   He is following with physical medicine for chronic lower back pain, last visit was yesterday. During this visit his provider noted the skin mass and he was advised to see PCP for evaluation. He denies fevers, chills, pain, drainage from the site.   Review of Systems  Constitutional:  Negative for chills and fever.  Skin:  Positive for color change and wound.         Past Medical History:  Diagnosis Date   Acute pain of left shoulder 08/29/2020   Allergic rhinitis 08/25/2015   Arthritis    Basal cell carcinoma 05/13/2019   Drosum nose supratip - ED&C    BPH (benign prostatic hyperplasia) 07/19/2013   Mildly uniformly enlarged on exam 07/19/13  Lab Results  Component Value Date   PSA 0.30 07/19/2013      Chest pain 04/25/2013   CHF (congestive heart failure) (HCC)    pt denies   Chronic hip pain    Right   Chronic pain of right lower extremity 11/27/2017   Contact dermatitis due to plant 01/23/2018   DOE (dyspnea on exertion) 01/23/2022   Erectile dysfunction    Fatigue 09/04/2018   History of kidney stones    HTN (hypertension)    Hyperlipidemia    Hypertension    Iritis    Iron deficiency anemia 09/28/2021   Neuropathy    Palpitations 05/03/2013   Poison ivy dermatitis 12/30/2019   Prediabetes 06/11/2018   Preoperative  clearance 12/21/2021   Preventative health care 06/11/2018   Sleep apnea     Social History   Socioeconomic History   Marital status: Married    Spouse name: Not on file   Number of children: Not on file   Years of education: Not on file   Highest education level: Not on file  Occupational History   Not on file  Tobacco Use   Smoking status: Former    Types: Pipe   Smokeless tobacco: Former   Tobacco comments:    Quit 1 mo ago  Advertising account planner   Vaping status: Never Used  Substance and Sexual Activity   Alcohol use: No   Drug use: No   Sexual activity: Not on file  Other Topics Concern   Not on file  Social History Narrative   ** Merged History Encounter **       Married. 2 children, 2 grandchildren, 2 great grandchilden. Retired.     Social Drivers of Corporate investment banker Strain: Low Risk  (01/27/2024)   Received from [facility]   Overall Financial Resource Strain (CARDIA)    Difficulty of Paying Living Expenses: Not very hard  Food Insecurity: No Food Insecurity (01/27/2024)   Received from [facility]   Hunger Vital  Sign    Within the past 12 months, you worried that your food would run out before you got the money to buy more.: Never true    Within the past 12 months, the food you bought just didn't last and you didn't have money to get more.: Never true  Transportation Needs: No Transportation Needs (01/27/2024)   Received from [facility] - Transportation    In the past 12 months, has lack of transportation kept you from medical appointments or from getting medications?: No    Lack of Transportation (Non-Medical): No  Physical Activity: Insufficiently Active (11/25/2023)   Exercise Vital Sign    Days of Exercise per Week: 7 days    Minutes of Exercise per Session: 20 min  Stress: No Stress Concern Present (11/25/2023)   [employer] of Occupational Health - Occupational Stress Questionnaire     Feeling of Stress : Not at all  Social Connections: Moderately Integrated (11/25/2023)   Social Connection and Isolation Panel    Frequency of Communication with Friends and Family: More than three times a week    Frequency of Social Gatherings with Friends and Family: More than three times a week    Attends Religious Services: More than 4 times per year    Active Member of [employer] or Organizations: No    Attends Banker Meetings: Never    Marital Status: Married  Catering manager Violence: Not At Risk (11/25/2023)   Humiliation, Afraid, Rape, and Kick questionnaire    Fear of Current or Ex-Partner: No    Emotionally Abused: No    Physically Abused: No    Sexually Abused: No    Past Surgical History:  Procedure Laterality Date   BACK SURGERY  2000   HERNIA REPAIR     ORIF FEMUR FRACTURE Right 1967   rod insertion   TONSILLECTOMY AND ADENOIDECTOMY     TOTAL HIP ARTHROPLASTY Right 01/28/2022   Procedure: RIGHT TOTAL HIP ARTHROPLASTY POSTERIOR, REMOVAL OF K NAIL;  Surgeon: Liam Lerner, MD;  Location: [facility];  Service: Orthopedics;  Laterality: Right;    Family History  Problem Relation Age of Onset   Heart failure Mother    Lupus Mother    Heart failure Father     No Known Allergies  Current Outpatient Medications on File Prior to Visit  Medication Sig Dispense Refill   aspirin  EC 81 MG tablet Take 1 tablet (81 mg total) by mouth 2 (two) times daily. 60 tablet 0   ferrous sulfate  325 (65 FE) MG tablet Take 650 mg by mouth daily with breakfast.     fluticasone  (FLONASE ) 50 MCG/ACT nasal spray Place 1 spray into both nostrils daily as needed for allergies or rhinitis.     furosemide  (LASIX ) 20 MG tablet TAKE 1 TABLET (20 MG TOTAL) BY MOUTH DAILY. FOR LEG SWELLING 90 tablet 2   gabapentin  (NEURONTIN ) 300 MG capsule TAKE 1 CAPSULE IN THE MORNING, 1 capsule in the afternoon, AND 3 CAPS AT BEDTIME FOR PAIN 450 capsule 3   sildenafil  (VIAGRA ) 100 MG tablet Take 1 tablet  by mouth 30 minutes prior to sexual activity. 30 tablet 0   tamsulosin  (FLOMAX ) 0.4 MG CAPS capsule TAKE 1 CAPSULE BY MOUTH IN THE MORNING FOR  URINE  STREAM 90 capsule 2   diclofenac  Sodium (VOLTAREN ) 1 % GEL Apply 2 g topically 3 (three) times daily as needed. For pain. (Patient not taking: Reported on 01/28/2024) 100 g 0  No current facility-administered medications on file prior to visit.    BP (!) 166/82   Pulse 73   Temp (!) 97.2 F (36.2 C) (Temporal)   Ht 5' 11 (1.803 m)   Wt 258 lb (117 kg)   SpO2 96%   BMI 35.98 kg/m  Objective:   Physical Exam Constitutional:      General: He is not in acute distress. Pulmonary:     Effort: Pulmonary effort is normal.  Skin:    General: Skin is warm and dry.     Comments: 1.5 cm rounded superficial skin mass with raised center to upper mid lumbar back, slightly left to spine.  Center with scabbed opening.  No drainage.  Nontender. Mild surrounding dark erythema.  Neurological:     Mental Status: He is alert.           Assessment & Plan:  Localized skin mass, lump, or swelling Assessment & Plan: Exam today representative of cyst without obvious spinal involvement. There is no apparent infection, mostly irritation from recent attempt to reduce the mass per his wife.  He was instructed to leave the mass alone, allow it to heal. He was also instructed to notify if he develops drainage, redness, pain at the site.  We discussed obtaining an ultrasound for definitive diagnosis.  He is pending a MR lumbar spine which may or may not show area.  He would like to wait for the MRI first which is reasonable.  If the MRI does not provide information regarding the mass then we will proceed with soft tissue ultrasound.         Jashun Puertas K Eulalio Reamy, NP

## 2024-01-28 NOTE — Assessment & Plan Note (Addendum)
 Exam today representative of cyst without obvious spinal involvement. There is no apparent infection, mostly irritation from recent attempt to reduce the mass per his wife.  He was instructed to leave the mass alone, allow it to heal. He was also instructed to notify if he develops drainage, redness, pain at the site.  We discussed obtaining an ultrasound for definitive diagnosis.  He is pending a MR lumbar spine which may or may not show area.  He would like to wait for the MRI first which is reasonable.  If the MRI does not provide information regarding the mass then we will proceed with soft tissue ultrasound.

## 2024-01-31 ENCOUNTER — Ambulatory Visit
Admission: RE | Admit: 2024-01-31 | Discharge: 2024-01-31 | Disposition: A | Discharge status: Home / Self Care | Source: Ambulatory Visit | Attending: Physical Medicine & Rehabilitation | Admitting: Physical Medicine & Rehabilitation

## 2024-01-31 DIAGNOSIS — M5442 Lumbago with sciatica, left side: Secondary | ICD-10-CM

## 2024-01-31 DIAGNOSIS — M47816 Spondylosis without myelopathy or radiculopathy, lumbar region: Secondary | ICD-10-CM | POA: Diagnosis not present

## 2024-01-31 DIAGNOSIS — M419 Scoliosis, unspecified: Secondary | ICD-10-CM | POA: Diagnosis not present

## 2024-02-06 ENCOUNTER — Other Ambulatory Visit: Payer: Self-pay | Admitting: Physical Medicine & Rehabilitation

## 2024-02-06 DIAGNOSIS — D471 Chronic myeloproliferative disease: Secondary | ICD-10-CM

## 2024-02-13 ENCOUNTER — Ambulatory Visit
Admission: RE | Admit: 2024-02-13 | Discharge: 2024-02-13 | Disposition: A | Discharge status: Home / Self Care | Source: Ambulatory Visit | Attending: Physical Medicine & Rehabilitation | Admitting: Physical Medicine & Rehabilitation

## 2024-02-13 DIAGNOSIS — M5134 Other intervertebral disc degeneration, thoracic region: Secondary | ICD-10-CM | POA: Diagnosis not present

## 2024-02-13 DIAGNOSIS — D471 Chronic myeloproliferative disease: Secondary | ICD-10-CM

## 2024-02-13 MED ORDER — GADOPICLENOL 0.5 MMOL/ML IV SOLN
10.0000 mL | Freq: Once | INTRAVENOUS | Status: AC | PRN
  Administered 2024-02-13: 10 mL via INTRAVENOUS

## 2024-02-28 DIAGNOSIS — M545 Low back pain, unspecified: Secondary | ICD-10-CM | POA: Diagnosis not present

## 2024-02-28 DIAGNOSIS — M7062 Trochanteric bursitis, left hip: Secondary | ICD-10-CM | POA: Diagnosis not present

## 2024-03-03 DIAGNOSIS — M5441 Lumbago with sciatica, right side: Secondary | ICD-10-CM | POA: Diagnosis not present

## 2024-03-03 DIAGNOSIS — M5416 Radiculopathy, lumbar region: Secondary | ICD-10-CM | POA: Diagnosis not present

## 2024-03-03 DIAGNOSIS — M5442 Lumbago with sciatica, left side: Secondary | ICD-10-CM | POA: Diagnosis not present

## 2024-03-03 DIAGNOSIS — G8929 Other chronic pain: Secondary | ICD-10-CM | POA: Diagnosis not present

## 2024-03-05 ENCOUNTER — Ambulatory Visit (INDEPENDENT_AMBULATORY_CARE_PROVIDER_SITE_OTHER): Admitting: Primary Care

## 2024-03-05 ENCOUNTER — Telehealth: Payer: Self-pay | Admitting: Primary Care

## 2024-03-05 ENCOUNTER — Encounter: Payer: Self-pay | Admitting: Primary Care

## 2024-03-05 VITALS — BP 146/84 | HR 58 | Temp 97.2°F | Ht 71.0 in | Wt 258.0 lb

## 2024-03-05 DIAGNOSIS — R229 Localized swelling, mass and lump, unspecified: Secondary | ICD-10-CM

## 2024-03-05 DIAGNOSIS — M5442 Lumbago with sciatica, left side: Secondary | ICD-10-CM | POA: Diagnosis not present

## 2024-03-05 DIAGNOSIS — G8929 Other chronic pain: Secondary | ICD-10-CM | POA: Diagnosis not present

## 2024-03-05 DIAGNOSIS — M5441 Lumbago with sciatica, right side: Secondary | ICD-10-CM

## 2024-03-05 NOTE — Telephone Encounter (Signed)
 Called patient and reviewed all information. Patient verbalized understanding.  He is agreeable to US , prefers [facility].  Will call if any further questions.

## 2024-03-05 NOTE — Patient Instructions (Addendum)
    It was a pleasure to see you today!                                                Contains text generated by [employer].

## 2024-03-05 NOTE — Assessment & Plan Note (Signed)
 The area of concern is likely a cyst as it waxes and wanes.  It did not appear active as of now.  Exam today with nearly resolved lesion from mass that was evaluated on 01/28/2024. Will discuss options with patient regarding further characterization including soft tissue ultrasound.

## 2024-03-05 NOTE — Telephone Encounter (Signed)
 Noted, orders placed.

## 2024-03-05 NOTE — Progress Notes (Signed)
 Subjective:    Patient ID: Timothy Sheppard, male    DOB: November 07, 1945, 78 y.o.   MRN: [id number]  HPI    History of Present Illness      Timothy Sheppard is a very pleasant 78 y.o. male with a history of hypertension, CHF, chronic low back pain with bilateral sciatica, chronic right lower extremity pain, prediabetes, hyperlipidemia, neuropathy who presents today to discuss chronic back pain.  Currently following with physiatry through [facility], last office visit was 03/03/2024. During this visit his MRI was discussed. He underwent MRI lumbar spine in July 2025 which showed multilevel moderate foraminal stenosis, foraminal narrowing for which was suspected to be causing his pain.  He will undergo bilateral L5-S1 TFESI. He is scheduled for his first injection next week.   He presents today as he was told by his physiatrist to see his PCP.  He is unsure why.  He continues to experience bilateral lower back pain with bilateral sciatica.  He has been treating himself with Goody powder, ibuprofen, Tylenol  without improvement.  He is managed on gabapentin  300 mg in the morning, 300 mg in afternoon, 900 mg evening.  He was initiated on naproxen more recently per physiatry.  Goody powder and ibuprofen were discontinued.  He was evaluated in our office on 01/28/2024 for a chronic skin mass to the mid thoracic back which has waxed and waned in size over the years.  Soft tissue ultrasound was recommended at the time; however, he was pending MRI lumbar spine and wanted to start with that first.  His MR lumbar/thoracic spine from late July 2025 did not show details regarding the soft tissue mass.  Since his visit on 01/28/2024 his mass has nearly resolved.  Review of Systems  Musculoskeletal:  Positive for arthralgias and back pain.  Neurological:  Negative for weakness and numbness.         Past Medical History:  Diagnosis Date   Acute pain of left shoulder 08/29/2020   Allergic rhinitis  08/25/2015   Arthritis    Basal cell carcinoma 05/13/2019   Drosum nose supratip - ED&C    BPH (benign prostatic hyperplasia) 07/19/2013   Mildly uniformly enlarged on exam 07/19/13  Lab Results  Component Value Date   PSA 0.30 07/19/2013      Chest pain 04/25/2013   CHF (congestive heart failure) (HCC)    pt denies   Chronic hip pain    Right   Chronic pain of right lower extremity 11/27/2017   Contact dermatitis due to plant 01/23/2018   DOE (dyspnea on exertion) 01/23/2022   Erectile dysfunction    Fatigue 09/04/2018   History of kidney stones    HTN (hypertension)    Hyperlipidemia    Hypertension    Iritis    Iron deficiency anemia 09/28/2021   Neuropathy    Palpitations 05/03/2013   Poison ivy dermatitis 12/30/2019   Prediabetes 06/11/2018   Preoperative clearance 12/21/2021   Preventative health care 06/11/2018   Sleep apnea     Social History   Socioeconomic History   Marital status: Married    Spouse name: Not on file   Number of children: Not on file   Years of education: Not on file   Highest education level: Not on file  Occupational History   Not on file  Tobacco Use   Smoking status: Former    Types: Pipe   Smokeless tobacco: Former   Tobacco comments:    Quit 1 mo  ago  Vaping Use   Vaping status: Never Used  Substance and Sexual Activity   Alcohol use: No   Drug use: No   Sexual activity: Not on file  Other Topics Concern   Not on file  Social History Narrative   ** Merged History Encounter **       Married. 2 children, 2 grandchildren, 2 great grandchilden. Retired.     Social Drivers of Corporate investment banker Strain: Low Risk  (01/27/2024)   Received from [facility]   Overall Financial Resource Strain (CARDIA)    Difficulty of Paying Living Expenses: Not very hard  Food Insecurity: No Food Insecurity (01/27/2024)   Received from [facility]   Hunger Vital Sign    Within the past 12 months,  you worried that your food would run out before you got the money to buy more.: Never true    Within the past 12 months, the food you bought just didn't last and you didn't have money to get more.: Never true  Transportation Needs: No Transportation Needs (01/27/2024)   Received from [facility] - Transportation    In the past 12 months, has lack of transportation kept you from medical appointments or from getting medications?: No    Lack of Transportation (Non-Medical): No  Physical Activity: Insufficiently Active (11/25/2023)   Exercise Vital Sign    Days of Exercise per Week: 7 days    Minutes of Exercise per Session: 20 min  Stress: No Stress Concern Present (11/25/2023)   [employer] of Occupational Health - Occupational Stress Questionnaire    Feeling of Stress : Not at all  Social Connections: Moderately Integrated (11/25/2023)   Social Connection and Isolation Panel    Frequency of Communication with Friends and Family: More than three times a week    Frequency of Social Gatherings with Friends and Family: More than three times a week    Attends Religious Services: More than 4 times per year    Active Member of [employer] or Organizations: No    Attends Banker Meetings: Never    Marital Status: Married  Catering manager Violence: Not At Risk (11/25/2023)   Humiliation, Afraid, Rape, and Kick questionnaire    Fear of Current or Ex-Partner: No    Emotionally Abused: No    Physically Abused: No    Sexually Abused: No    Past Surgical History:  Procedure Laterality Date   BACK SURGERY  2000   HERNIA REPAIR     ORIF FEMUR FRACTURE Right 1967   rod insertion   TONSILLECTOMY AND ADENOIDECTOMY     TOTAL HIP ARTHROPLASTY Right 01/28/2022   Procedure: RIGHT TOTAL HIP ARTHROPLASTY POSTERIOR, REMOVAL OF K NAIL;  Surgeon: Liam Lerner, MD;  Location: [facility];  Service: Orthopedics;  Laterality: Right;    Family History  Problem Relation Age of Onset    Heart failure Mother    Lupus Mother    Heart failure Father     No Known Allergies  Current Outpatient Medications on File Prior to Visit  Medication Sig Dispense Refill   aspirin  EC 81 MG tablet Take 1 tablet (81 mg total) by mouth 2 (two) times daily. 60 tablet 0   diclofenac  Sodium (VOLTAREN ) 1 % GEL Apply 2 g topically 3 (three) times daily as needed. For pain. 100 g 0   ferrous sulfate  325 (65 FE) MG tablet Take 650 mg by mouth daily  with breakfast.     fluticasone  (FLONASE ) 50 MCG/ACT nasal spray Place 1 spray into both nostrils daily as needed for allergies or rhinitis.     furosemide  (LASIX ) 20 MG tablet TAKE 1 TABLET (20 MG TOTAL) BY MOUTH DAILY. FOR LEG SWELLING 90 tablet 2   gabapentin  (NEURONTIN ) 300 MG capsule TAKE 1 CAPSULE IN THE MORNING, 1 capsule in the afternoon, AND 3 CAPS AT BEDTIME FOR PAIN 450 capsule 3   sildenafil  (VIAGRA ) 100 MG tablet Take 1 tablet by mouth 30 minutes prior to sexual activity. 30 tablet 0   tamsulosin  (FLOMAX ) 0.4 MG CAPS capsule TAKE 1 CAPSULE BY MOUTH IN THE MORNING FOR  URINE  STREAM 90 capsule 2   No current facility-administered medications on file prior to visit.    BP (!) 146/84   Pulse (!) 58   Temp (!) 97.2 F (36.2 C) (Temporal)   Ht 5' 11 (1.803 m)   Wt 258 lb (117 kg)   SpO2 99%   BMI 35.98 kg/m  Objective:   Physical Exam Cardiovascular:     Rate and Rhythm: Normal rate and regular rhythm.  Pulmonary:     Effort: Pulmonary effort is normal.     Breath sounds: Normal breath sounds.  Musculoskeletal:     Cervical back: Neck supple.  Skin:    General: Skin is warm and dry.     Comments: Flat, slightly brown discoloration approximately 1 cm in circumference to mid thoracic back, left to the spine.  Nontender.  No erythema.  No mass.  Neurological:     Mental Status: He is alert and oriented to person, place, and time.  Psychiatric:        Mood and Affect: Mood normal.           Assessment & Plan:  Chronic  bilateral low back pain with bilateral sciatica Assessment & Plan: Reviewed MRI thoracic and lumbar spine from July 2025.  Proceed with spinal injections as scheduled.     Localized skin mass, lump, or swelling Assessment & Plan: The area of concern is likely a cyst as it waxes and wanes.  It did not appear active as of now.  Exam today with nearly resolved lesion from mass that was evaluated on 01/28/2024. Will discuss options with patient regarding further characterization including soft tissue ultrasound.      Assessment and Plan Assessment & Plan       Comer MARLA Gaskins, NP

## 2024-03-05 NOTE — Assessment & Plan Note (Addendum)
 Reviewed MRI thoracic and lumbar spine from July 2025.  Proceed with spinal injections as scheduled.

## 2024-03-05 NOTE — Telephone Encounter (Signed)
 Please call patient:  Please let him know that his physiatrist was concerned about the skin mass that he had on his back 1 month ago that his wife popped.  We can get a soft tissue ultrasound for further evaluation of the mass since the MRI did not pick it up.  Let me know if you would like to proceed.

## 2024-03-08 ENCOUNTER — Ambulatory Visit: Admitting: Primary Care

## 2024-03-08 DIAGNOSIS — M5416 Radiculopathy, lumbar region: Secondary | ICD-10-CM | POA: Diagnosis not present

## 2024-03-09 ENCOUNTER — Other Ambulatory Visit

## 2024-03-10 ENCOUNTER — Other Ambulatory Visit

## 2024-03-16 ENCOUNTER — Ambulatory Visit: Payer: Self-pay | Admitting: *Deleted

## 2024-03-16 NOTE — Telephone Encounter (Signed)
 Scheduled 2 week f/u with Mallie.

## 2024-03-16 NOTE — Telephone Encounter (Addendum)
 Called and spoke with patient and patients wife. States he just left [facility] where he was seen for this and given lisinopril  for BP.  Message was sent to management to address urgent's being sent to covering providers.

## 2024-03-16 NOTE — Telephone Encounter (Addendum)
 Copied from CRM #[id number]. Topic: Clinical - Red Word Triage >> Mar 16, 2024  8:36 AM Suzen RAMAN wrote: Red Word that prompted transfer to Nurse Triage: elevated blood pressure, (162/92), feeling funny Reason for Disposition  [1] Systolic BP >= 160 OR Diastolic >= 100 AND [2] cardiac (e.g., breathing difficulty, chest pain) or neurologic symptoms (e.g., new-onset blurred or double vision, unsteady gait)    Dizziness and feeling quizzy since yesterday.   Left arm is hurting but he has arthritis in that arm real bad so has pain in it.   It feels like his usual pain.  No shortness of breath or chest tightness.  Answer Assessment - Initial Assessment Questions 1. BLOOD PRESSURE: What is your blood pressure? Did you take at least two measurements 5 minutes apart?     162/92 this morning.   It was 205/105 earlier this morning.   Since yesterday it's been elevated.    2. ONSET: When did you take your blood pressure?     This morning I'm quizzy and dizzy feeling.  This started yesterday the dizziness.   I'm feeling this way now.   No visual changes.   No headaches.     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      I'm getting shots in my back for neuropathy.    3. HOW: How did you take your blood pressure? (e.g., automatic home BP monitor, visiting nurse)     Home monitor 4. HISTORY: Do you have a history of high blood pressure?     Many years ago 5. MEDICINES: Are you taking any medicines for blood pressure? Have you missed any doses recently?     Not on BP medications. 6. OTHER SYMPTOMS: Do you have any symptoms? (e.g., blurred vision, chest pain, difficulty breathing, headache, weakness)     Dizziness, and feeling quizzy.   No shortness of breath or chest tightness.   I'm having left arm pain but I have a lot of arthritis pain in that arm.    7. PREGNANCY: Is there any chance you are pregnant? When was your last menstrual period?     N/A  Protocols used: Blood  Pressure - High-A-AH FYI Only or Action Required?: Action required by provider: refusing ED disposition.  Patient was last seen in primary care on 03/05/2024 by Gretta Comer POUR, NP.  Called Nurse Triage reporting Hypertension.BP earlier this morning 205/105.  Last BP 162/92.   Not on BP medications.    Did have hypertension many years ago so not on any medications for BP now.  Symptoms began yesterday.Having dizziness and feeling quizzy and funny since yesterday.   He is c/o his left arm hurting however he says he has arthritis in his left arm real bad that causes him pain and this feels the same.  Interventions attempted: Nothing.  Symptoms are: rapidly worsening.Still having dizziness this morning.   No headaches or visual changes.   No shortness of breath or chest tightness, no numbness or weakness in any of his extremities.   His conversation/speech is clear and appropriate.   He really does not want to go to the ED.   Requesting to come see Comer Gretta, NP instead.   I called into the practice and spoke with Renee.  She's going to get his information and ED refusal to the clinical team and give him a call.   Triage Disposition: Go to ED Now (Notify PCP)  Patient/caregiver understands and will follow  disposition?: No, wishes to speak with PCP

## 2024-03-16 NOTE — Telephone Encounter (Addendum)
 Patient needs an office visit ASAP if he is refusing other care. Can we also make sure my urgent messages are routed to the covering providers on my away message? Thanks!

## 2024-03-16 NOTE — Telephone Encounter (Signed)
 Noted. Let's get him scheduled for a BP check in 2 weeks with me. Thanks!

## 2024-03-23 DIAGNOSIS — M5416 Radiculopathy, lumbar region: Secondary | ICD-10-CM | POA: Diagnosis not present

## 2024-03-23 DIAGNOSIS — G8929 Other chronic pain: Secondary | ICD-10-CM | POA: Diagnosis not present

## 2024-03-23 DIAGNOSIS — M5442 Lumbago with sciatica, left side: Secondary | ICD-10-CM | POA: Diagnosis not present

## 2024-03-23 DIAGNOSIS — M5441 Lumbago with sciatica, right side: Secondary | ICD-10-CM | POA: Diagnosis not present

## 2024-03-23 NOTE — Progress Notes (Signed)
 Chief Complaint: Chief Complaint  Patient presents with  . Follow-up  Back pain traveling into the bilateral legs  HPI: Patient is a pleasant 78 y.o. male seen in follow-up for evaluation of back pain traveling into the bilateral legs.  He is status post left L5-S1 and right S1 TFESI 03/08/2024 with temporary improvement.  He denies any problems or complications postinjection.  He rates his pain as a 10/10.  It is constant, sharp, dull, aching, tight, burning. Pain has been going on for approximately 1 year.  He denies any injury.  He does complain of numbness, tingling, weakness in the bilateral legs as well as his back but denies any loss of control of bowel or bladder.  It is worse with any activity and better with rest and elevation.  He has been on chronic gabapentin  but pain persists.   Review of Systems: A 10 point review of systems is negative, except for the pertinent positives and negatives detailed in the HPI.  PMH: No past medical history on file.   PSH: Past Surgical History:  Procedure Laterality Date  . BACK SURGERY    . REPLACEMENT TOTAL HIP W/  RESURFACING IMPLANTS       Family History: Family History  Problem Relation Name Age of Onset  . High blood pressure (Hypertension) Mother    . High blood pressure (Hypertension) Father       Social History: Social History   Socioeconomic History  . Marital status: Married  Tobacco Use  . Smoking status: Never    Passive exposure: Never  . Smokeless tobacco: Never  Substance and Sexual Activity  . Alcohol use: Never  . Drug use: Never  . Sexual activity: Yes   Social Drivers of Corporate investment banker Strain: Low Risk  (01/27/2024)   Overall Financial Resource Strain (CARDIA)   . Difficulty of Paying Living Expenses: Not very hard  Food Insecurity: No Food Insecurity (01/27/2024)   Hunger Vital Sign   . Worried About Programme researcher, broadcasting/film/video in the Last Year: Never true   . Ran Out of Food in the Last Year: Never  true  Transportation Needs: No Transportation Needs (01/27/2024)   PRAPARE - Transportation   . Lack of Transportation (Medical): No   . Lack of Transportation (Non-Medical): No     Allergies: No Known Allergies   Medications:  Current Outpatient Medications:  .  acetaminophen  (TYLENOL ) 160 mg/5 mL elixir, Take by mouth every 4 (four) hours as needed for Fever, Disp: , Rfl:  .  aspirin  81 MG EC tablet, Take 8 mg by mouth 2 (two) times daily, Disp: , Rfl:  .  ferrous sulfate  325 (65 FE) MG tablet, Take 650 mg by mouth, Disp: , Rfl:  .  fluticasone  propionate (FLONASE ) 50 mcg/actuation nasal spray, Place 1 spray into one nostril, Disp: , Rfl:  .  FUROsemide  (LASIX ) 20 MG tablet, Take 20 mg by mouth, Disp: , Rfl:  .  gabapentin  (NEURONTIN ) 300 MG capsule, TAKE 1 CAPSULE IN THE MORNING, 1 capsule in the afternoon, AND 3 CAPS AT BEDTIME FOR PAIN, Disp: , Rfl:  .  naproxen (NAPROSYN) 500 MG tablet, 1 po bid prn, Disp: 60 tablet, Rfl: 1 .  sildenafiL  (VIAGRA ) 100 MG tablet, Take 1 tablet by mouth 30 minutes prior to sexual activity., Disp: , Rfl:  .  tamsulosin  (FLOMAX ) 0.4 mg capsule, TAKE 1 CAPSULE BY MOUTH IN THE MORNING FOR URINE STREAM, Disp: , Rfl:   Vitals: Vitals:  03/23/24 1216  BP: (!) 164/87  Pulse: 60  Temp: 36.3 C (97.4 F)  TempSrc: Oral  Weight: (!) 115.7 kg (255 lb 1.2 oz)  Height: 180.3 cm (5' 10.98)  PainSc: 10-Worst pain ever  PainLoc: Back    Imaging: MRI thoracic spine done at [facility] 01/2024 with and without contrast: T5-6 disc bulge indenting the ventral thecal sac; T6-7 right paracentral disc indenting ventral thecal sac with mild flattening of the cord; T7-8 disc bulge eccentric to the left indenting the ventral thecal sac; T10-11 right paracentral disc protrusion indenting ventral thecal sac; T10-11 mild bilateral NFS; no signal abnormality appreciated within the T11 vertebral body - findings on prior MRI lumbar spine are likely related to artifact  MRI  lumbar spine 01/2024 done at [facility]: L2-3 moderate bilateral NFS; L3-4 moderate CCS and bilateral NFS; L4-5 moderate right NFS; L5-S1 moderate bilateral NFS  GFR 83 drawn on 11/27/2023 Hemoglobin A1c 5.5 drawn on 11/27/2023 Platelet count 233 drawn on 11/20/2022  Assessment: Chronic low back pain with bilateral lumbar radiculitis -s/p left L5-S1 and right S1 TFESI 03/08/2024 with temporary relief (unable to get to the right L5-S1 neuroforamen due to iliac crest)  History of back surgery  History of total right hip arthroplasty  History of neuropathy History of hypertension History of CHF  Plan: He denies any problems or complications postprocedure.  Unfortunately he only received short-term improvement with the epidural injections.  I did discuss further treatment including physical therapy, repeat epidural injections and potential for surgery.  He would like to try to keep surgery as a last resort.  I do recommend both the physical therapy and the repeat epidural injections.  Referral placed for PT at [facility]: Lambert Rhonda W in [place] per his request.  I will set him up for left L5-S1 and right S1 TFESI.  I will change the injectate and use Celestone  instead of dexamethasone  to hopefully get better longer-term relief.  Despite the risk factor of hypertension I do believe benefits outweigh the risks.  He is on gabapentin  300 in the morning, 300 in the afternoon and 900 mg in the evening.  I will defer any gabapentin  therapy to his PCP.   I did discuss potential for surgery but we will hold off on that for right now to see how he does with the ESI.  I have prescribed him naproxen.  He may continue to take this as needed.  Follow-up 2 weeks postinjection.  I will check to see if he is started with PT.  Would consider referral to neurosurgery.      Patient agrees with above plan.  Answered all questions.

## 2024-03-24 ENCOUNTER — Emergency Department
Admission: EM | Admit: 2024-03-24 | Discharge: 2024-03-24 | Disposition: A | Discharge status: Home / Self Care | Source: Ambulatory Visit | Attending: Emergency Medicine | Admitting: Emergency Medicine

## 2024-03-24 ENCOUNTER — Other Ambulatory Visit: Payer: Self-pay

## 2024-03-24 ENCOUNTER — Ambulatory Visit: Payer: Self-pay

## 2024-03-24 ENCOUNTER — Emergency Department

## 2024-03-24 DIAGNOSIS — I110 Hypertensive heart disease with heart failure: Secondary | ICD-10-CM | POA: Diagnosis not present

## 2024-03-24 DIAGNOSIS — I10 Essential (primary) hypertension: Secondary | ICD-10-CM

## 2024-03-24 DIAGNOSIS — Z96641 Presence of right artificial hip joint: Secondary | ICD-10-CM | POA: Diagnosis not present

## 2024-03-24 DIAGNOSIS — Z85828 Personal history of other malignant neoplasm of skin: Secondary | ICD-10-CM | POA: Insufficient documentation

## 2024-03-24 DIAGNOSIS — I509 Heart failure, unspecified: Secondary | ICD-10-CM | POA: Diagnosis not present

## 2024-03-24 DIAGNOSIS — R079 Chest pain, unspecified: Secondary | ICD-10-CM | POA: Diagnosis not present

## 2024-03-24 DIAGNOSIS — K449 Diaphragmatic hernia without obstruction or gangrene: Secondary | ICD-10-CM | POA: Diagnosis not present

## 2024-03-24 DIAGNOSIS — Z79899 Other long term (current) drug therapy: Secondary | ICD-10-CM | POA: Insufficient documentation

## 2024-03-24 DIAGNOSIS — R0602 Shortness of breath: Secondary | ICD-10-CM | POA: Diagnosis present

## 2024-03-24 LAB — CBC WITH DIFFERENTIAL/PLATELET
Abs Immature Granulocytes: 0.03 K/uL (ref 0.00–0.07)
Basophils Absolute: 0 K/uL (ref 0.0–0.1)
Basophils Relative: 0 %
Eosinophils Absolute: 0.2 K/uL (ref 0.0–0.5)
Eosinophils Relative: 3 %
HCT: 43.9 % (ref 39.0–52.0)
Hemoglobin: 14.7 g/dL (ref 13.0–17.0)
Immature Granulocytes: 0 %
Lymphocytes Relative: 17 %
Lymphs Abs: 1.2 K/uL (ref 0.7–4.0)
MCH: 32 pg (ref 26.0–34.0)
MCHC: 33.5 g/dL (ref 30.0–36.0)
MCV: 95.4 fL (ref 80.0–100.0)
Monocytes Absolute: 0.4 K/uL (ref 0.1–1.0)
Monocytes Relative: 6 %
Neutro Abs: 5.4 K/uL (ref 1.7–7.7)
Neutrophils Relative %: 74 %
Platelets: 203 K/uL (ref 150–400)
RBC: 4.6 MIL/uL (ref 4.22–5.81)
RDW: 12.7 % (ref 11.5–15.5)
WBC: 7.3 K/uL (ref 4.0–10.5)
nRBC: 0 % (ref 0.0–0.2)

## 2024-03-24 LAB — COMPREHENSIVE METABOLIC PANEL WITH GFR
ALT: 15 U/L (ref 0–44)
AST: 18 U/L (ref 15–41)
Albumin: 3.8 g/dL (ref 3.5–5.0)
Alkaline Phosphatase: 36 U/L — ABNORMAL LOW (ref 38–126)
Anion gap: 9 (ref 5–15)
BUN: 16 mg/dL (ref 8–23)
CO2: 26 mmol/L (ref 22–32)
Calcium: 9.2 mg/dL (ref 8.9–10.3)
Chloride: 105 mmol/L (ref 98–111)
Creatinine, Ser: 0.93 mg/dL (ref 0.61–1.24)
GFR, Estimated: >60 mL/min (ref >60)
Glucose, Bld: 102 mg/dL — ABNORMAL HIGH (ref 70–99)
Potassium: 4.8 mmol/L (ref 3.5–5.1)
Sodium: 140 mmol/L (ref 135–145)
Total Bilirubin: 1.3 mg/dL — ABNORMAL HIGH (ref 0.0–1.2)
Total Protein: 6.6 g/dL (ref 6.5–8.1)

## 2024-03-24 LAB — TROPONIN I (HIGH SENSITIVITY): Troponin I (High Sensitivity): 4 ng/L (ref <18)

## 2024-03-24 LAB — BRAIN NATRIURETIC PEPTIDE: B Natriuretic Peptide: 237.2 pg/mL — ABNORMAL HIGH (ref 0.0–100.0)

## 2024-03-24 NOTE — ED Triage Notes (Signed)
 Pt sent by PCP for HTN. Pt has been tracking BP x1 week and it has ranged from 160s-190s. Pt endorses intermittent SOB at rest. Pt says he used to have a heart doctor but doesn't remember why (he thinks he had a heart attack). Pt currently takes 20mg  lisinopril  daily.

## 2024-03-24 NOTE — Telephone Encounter (Signed)
  FYI Only or Action Required?: Action required by provider: update on patient condition.  Patient was last seen in primary care on 03/05/2024 by Timothy Comer POUR, NP.  Called Nurse Triage reporting Hypertension and Shortness of Breath.  Symptoms began several weeks ago.  Interventions attempted: Prescription medications: lisinopril  from UC.  Symptoms are: gradually worsening.  Triage Disposition: Go to ED Now (Notify PCP)  Patient/caregiver understands and will follow disposition?: Yes  Copied from CRM #[id number]. Topic: Clinical - Red Word Triage >> Mar 24, 2024 10:09 AM Chasity T wrote: [facility] that prompted transfer to Nurse Triage: Timothy Sheppard wife of patient is calling for high blood pressure 194/98 and shortness of breath Reason for Disposition  [1] Systolic BP >= 160 OR Diastolic >= 100 AND [2] cardiac (e.g., breathing difficulty, chest pain) or neurologic symptoms (e.g., new-onset blurred or double vision, unsteady gait)  Answer Assessment - Initial Assessment Questions 1. BLOOD PRESSURE: What is your blood pressure? Did you take at least two measurements 5 minutes apart?     194/98- has been running high for a couple of weeks On recheck at 1028: 200/99 2. ONSET: When did you take your blood pressure?     1015 today 3. HOW: How did you take your blood pressure? (e.g., automatic home BP monitor, visiting nurse)     Automatic cuff 4. HISTORY: Do you have a history of high blood pressure?     Yes, history of HTN 5. MEDICINES: Are you taking any medicines for blood pressure? Have you missed any doses recently?     Family states that he was on meds for BP a long time ago New med for BP lisinopril  20 mg 6. OTHER SYMPTOMS: Do you have any symptoms? (e.g., blurred vision, chest pain, difficulty breathing, headache, weakness)     States that he feels really bad and is short of breath today C/O SOB both with activity and rest.  7. PREGNANCY: Is there any chance you are  pregnant? When was your last menstrual period?     N/A  Protocols used: Blood Pressure - High-A-AH

## 2024-03-24 NOTE — Telephone Encounter (Signed)
 Can we get him scheduled for this Friday?

## 2024-03-24 NOTE — ED Provider Notes (Signed)
 [facility] Provider Note    Event Date/Time   First MD Initiated Contact with Patient 03/24/24 1318     (approximate)   History   Hypertension   HPI  Timothy Sheppard is a 78 y.o. male with a past medical history of hypertension.  Patient reports that he has been keeping track of his blood pressure and notes that it has been anywhere between the 150s in the 180s over the past couple of weeks.  He reports that he has been very uncomfortable because of his back problems and neuropathy and has been in a lot of pain, though nothing different than usual.  He has not had any chest pain or shortness of breath.  He denies any calf pain or leg swelling.  He admits to sometimes forgetting to take his blood pressure medication.  He denies orthopnea or dyspnea on exertion.  He has not had any chest pain on exertion.  No cough.  Patient Active Problem List   Diagnosis Date Noted   Localized skin mass, lump, or swelling 01/28/2024   Chronic low back pain with bilateral sciatica 11/27/2023   H/O total hip arthroplasty, right 01/28/2022   Osteoarthritis of right hip 01/25/2022   Hyperlipidemia 01/23/2022   Sleep apnea 01/23/2022   Encounter for annual general medical examination with abnormal findings in adult 01/23/2022   Obesity (BMI 35.0-39.9 without comorbidity) 01/23/2022   Iron deficiency anemia 09/28/2021   Basal cell carcinoma 05/13/2019   Prediabetes 06/11/2018   Arthritis 06/11/2018   Neuropathy 01/09/2018   Erectile dysfunction 01/09/2018   Chronic pain of right lower extremity 11/27/2017   CHF (congestive heart failure) (HCC)    Allergic rhinitis 08/25/2015   History of kidney stones 07/21/2013   BPH (benign prostatic hyperplasia) 07/19/2013   Palpitations 05/03/2013   HTN (hypertension) 04/25/2013          Physical Exam   Triage Vital Signs: ED Triage Vitals  Encounter Vitals Group     BP 03/24/24 1128 (!) 184/90     Girls Systolic BP  Percentile --      Girls Diastolic BP Percentile --      Boys Systolic BP Percentile --      Boys Diastolic BP Percentile --      Pulse Rate 03/24/24 1126 64     Resp 03/24/24 1126 19     Temp 03/24/24 1126 97.8 F (36.6 C)     Temp Source 03/24/24 1126 Oral     SpO2 03/24/24 1126 98 %     Weight 03/24/24 1127 255 lb (115.7 kg)     Height 03/24/24 1127 5' 11 (1.803 m)     Head Circumference --      Peak Flow --      Pain Score 03/24/24 1127 0     Pain Loc --      Pain Education --      Exclude from Growth Chart --     Most recent vital signs: Vitals:   03/24/24 1128 03/24/24 1413  BP: (!) 184/90 (!) 154/88  Pulse:  66  Resp:  18  Temp:  97.7 F (36.5 C)  SpO2:  98%    Physical Exam Vitals and nursing note reviewed.  Constitutional:      General: Awake and alert. No acute distress.    Appearance: Normal appearance. The patient is normal weight.  HENT:     Head: Normocephalic and atraumatic.     Mouth: Mucous membranes are  moist.  Eyes:     General: PERRL. Normal EOMs        Right eye: No discharge.        Left eye: No discharge.     Conjunctiva/sclera: Conjunctivae normal.  Cardiovascular:     Rate and Rhythm: Normal rate and regular rhythm.     Pulses: Normal pulses.  No JVD Pulmonary:     Effort: Pulmonary effort is normal. No respiratory distress.     Breath sounds: Normal breath sounds.  Abdominal:     Abdomen is soft. There is no abdominal tenderness. No rebound or guarding. No distention. Musculoskeletal:        General: No swelling. Normal range of motion.     Cervical back: Normal range of motion and neck supple.  No lower extremity edema Skin:    General: Skin is warm and dry.     Capillary Refill: Capillary refill takes less than 2 seconds.     Findings: No rash.  Neurological:     Mental Status: The patient is awake and alert.      ED Results / Procedures / Treatments   Labs (all labs ordered are listed, but only abnormal results are  displayed) Labs Reviewed  COMPREHENSIVE METABOLIC PANEL WITH GFR - Abnormal; Notable for the following components:      Result Value   Glucose, Bld 102 (*)    Alkaline Phosphatase 36 (*)    Total Bilirubin 1.3 (*)    All other components within normal limits  BRAIN NATRIURETIC PEPTIDE - Abnormal; Notable for the following components:   B Natriuretic Peptide 237.2 (*)    All other components within normal limits  CBC WITH DIFFERENTIAL/PLATELET  TROPONIN I (HIGH SENSITIVITY)  TROPONIN I (HIGH SENSITIVITY)     EKG     RADIOLOGY I independently reviewed and interpreted imaging and agree with radiologists findings.     PROCEDURES:  Critical Care performed:   Procedures   MEDICATIONS ORDERED IN ED: Medications - No data to display   IMPRESSION / MDM / ASSESSMENT AND PLAN / ED COURSE  I reviewed the triage vital signs and the nursing notes.   Differential diagnosis includes, but is not limited to, elevated blood pressure reading from hypertension/discomfort/stress, endorgan damage, heart failure.  Patient is awake and alert, hemodynamically stable and afebrile.  He has a blood pressure of 184/90.  He has with him a list of his blood pressures which has been ranging anywhere between 150s and 170s to 80s.  Labs obtained in triage are overall reassuring, no evidence of endorgan damage to kidneys or heart.  He has no headache or blurry vision.  He has no chest pain or shortness of breath.  He appears to have asymptomatic hypertension.  Additionally, he admits to not taking his blood pressure medicine every single day, we discussed the importance of doing so and the possibility that his blood pressure is elevated given that he has not been taking his blood pressure every day.  BNP was ordered in triage and is mildly elevated.  He has no lower extremity edema, orthopnea, dyspnea on exertion, JVD, or x-ray findings consistent with CHF exacerbation at this time.  Discussed the  importance of taking his medicine every single day as prescribed, and if his blood pressure is still elevated, discussed the possibility of taking double of his Lasix , though advised that this is a conversation that he should have with his PCP.  Also advised that if we give him a another  blood pressure medication and it lowers his blood pressure too rapidly, this could increase his risk for developing a stroke as well.  Patient is in agreement with this.  He feels comfortable not starting any additional medicines at this time and has a follow-up appointment with his PCP next week.  I advised that they talk about it at that time.  I also advised that he should be taking his blood pressure after he has been sitting calmly for 30 minutes as this is a more accurate blood pressure reading.  He does report that he has been taking his blood pressure after walking around or working outside, and this could lead to higher blood pressure reading.  We did discuss very strict return precautions and the importance of close outpatient follow-up.  Patient understands and agrees with plan.  He was discharged in stable condition.   Patient's presentation is most consistent with acute complicated illness / injury requiring diagnostic workup.   FINAL CLINICAL IMPRESSION(S) / ED DIAGNOSES   Final diagnoses:  Hypertension, unspecified type     Rx / DC Orders   ED Discharge Orders     None        Note:  This document was prepared using Dragon voice recognition software and may include unintentional dictation errors.   Abrie Egloff E, PA-C 03/24/24 1422    Jacolyn Pae, MD 03/24/24 1441

## 2024-03-24 NOTE — Discharge Instructions (Signed)
 Please follow-up with your outpatient provider.  Please return for any new, worsening, or change in symptoms or other concerns including headache, blurry vision, chest pain, trouble breathing, swelling in your legs, or any other concerns.  It was a pleasure caring for you today.

## 2024-03-25 NOTE — Telephone Encounter (Signed)
 Scheduled patient @ 2:40 on 9/5.

## 2024-03-26 ENCOUNTER — Encounter: Payer: Self-pay | Admitting: Primary Care

## 2024-03-26 ENCOUNTER — Ambulatory Visit (INDEPENDENT_AMBULATORY_CARE_PROVIDER_SITE_OTHER): Admitting: Primary Care

## 2024-03-26 VITALS — BP 110/64 | HR 105 | Temp 97.2°F | Ht 71.0 in | Wt 257.0 lb

## 2024-03-26 DIAGNOSIS — I10 Essential (primary) hypertension: Secondary | ICD-10-CM | POA: Diagnosis not present

## 2024-03-26 DIAGNOSIS — I509 Heart failure, unspecified: Secondary | ICD-10-CM

## 2024-03-26 LAB — BASIC METABOLIC PANEL WITH GFR
BUN/Creatinine Ratio: 32 (calc) — ABNORMAL HIGH (ref 6–22)
BUN: 36 mg/dL — ABNORMAL HIGH (ref 7–25)
CO2: 28 mmol/L (ref 20–32)
Calcium: 9.5 mg/dL (ref 8.6–10.3)
Chloride: 101 mmol/L (ref 98–110)
Creat: 1.13 mg/dL (ref 0.70–1.28)
Glucose, Bld: 94 mg/dL (ref 65–99)
Potassium: 4.6 mmol/L (ref 3.5–5.3)
Sodium: 138 mmol/L (ref 135–146)
eGFR: 67 mL/min/1.73m2 (ref >=60)

## 2024-03-26 NOTE — Addendum Note (Signed)
 Addended by: ISADORA RAISIN on: 03/26/2024 03:25 PM   Modules accepted: Orders

## 2024-03-26 NOTE — Patient Instructions (Signed)
 Stop by the lab prior to leaving today. I will notify you of your results once received.   Reduce your fluid pill by taking only 1 furosemide  20 mg pill once daily for fluid.  Continue taking lisinopril  20 mg once daily for blood pressure for now.  Please call me in 2 weeks with blood pressure readings.  It was a pleasure to see you today!

## 2024-03-26 NOTE — Progress Notes (Signed)
 Subjective:    Patient ID: Timothy Sheppard, male    DOB: Dec 17, 1945, 78 y.o.   MRN: [id number]  Timothy Sheppard is a very pleasant 78 y.o. male with a history of hypertension, CHF, sleep apnea, chronic back pain, BPH, hyperlipidemia who presents today for ED follow-up.  He presented to urgent care a few weeks ago with elevated BP readings. He was prescribed lisinopril  20 mg daily.   He presented to [facility] ED on 03/24/2024 with reports of elevated blood pressure readings at 150-180 systolic.  He also mentioned increased back pain and neuropathy.  Previously on antihypertensive treatment but has been off for years.  During his stay in the ED his blood pressure was elevated including readings of 154/88, 184/90.  He underwent EKG which was negative for acute findings.  Labs were grossly unremarkable except for mildly elevated BNP.  He admitted to not taking Lasix  daily but was compliant to lisinopril  20 mg.  He was discharged home later that evening with recommendations for PCP follow-up to discuss doubling Lasix  dose versus adding antihypertensive agent.  Since his ED visit he's feeling better. He admits to not taking any of his medications inconsistently for the last few months. He is now taking 40 mg of furosemide  daily and lisinopril  20 mg daily. He has noticed feeling dizzy and lightheaded, especially with position changes.   BP Readings from Last 3 Encounters:  03/26/24 110/64  03/24/24 (!) 154/88  03/05/24 (!) 146/84      Review of Systems  Respiratory:  Negative for shortness of breath.   Cardiovascular:  Negative for chest pain.  Neurological:  Positive for dizziness and light-headedness. Negative for headaches.         Past Medical History:  Diagnosis Date   Acute pain of left shoulder 08/29/2020   Allergic rhinitis 08/25/2015   Arthritis    Basal cell carcinoma 05/13/2019   Drosum nose supratip - ED&C    BPH (benign prostatic hyperplasia) 07/19/2013   Mildly uniformly  enlarged on exam 07/19/13  Lab Results  Component Value Date   PSA 0.30 07/19/2013      Chest pain 04/25/2013   CHF (congestive heart failure) (HCC)    pt denies   Chronic hip pain    Right   Chronic pain of right lower extremity 11/27/2017   Contact dermatitis due to plant 01/23/2018   DOE (dyspnea on exertion) 01/23/2022   Erectile dysfunction    Fatigue 09/04/2018   History of kidney stones    HTN (hypertension)    Hyperlipidemia    Hypertension    Iritis    Iron deficiency anemia 09/28/2021   Neuropathy    Palpitations 05/03/2013   Poison ivy dermatitis 12/30/2019   Prediabetes 06/11/2018   Preoperative clearance 12/21/2021   Preventative health care 06/11/2018   Sleep apnea     Social History   Socioeconomic History   Marital status: Married    Spouse name: Not on file   Number of children: Not on file   Years of education: Not on file   Highest education level: Not on file  Occupational History   Not on file  Tobacco Use   Smoking status: Former    Types: Pipe   Smokeless tobacco: Former   Tobacco comments:    Quit 1 mo ago  Advertising account planner   Vaping status: Never Used  Substance and Sexual Activity   Alcohol use: No   Drug use: No   Sexual activity: Not on  file  Other Topics Concern   Not on file  Social History Narrative   ** Merged History Encounter **       Married. 2 children, 2 grandchildren, 2 great grandchilden. Retired.     Social Drivers of Corporate investment banker Strain: Low Risk  (01/27/2024)   Received from [facility]   Overall Financial Resource Strain (CARDIA)    Difficulty of Paying Living Expenses: Not very hard  Food Insecurity: No Food Insecurity (01/27/2024)   Received from [facility]   Hunger Vital Sign    Within the past 12 months, you worried that your food would run out before you got the money to buy more.: Never true    Within the past 12 months, the food you bought just didn't last and  you didn't have money to get more.: Never true  Transportation Needs: No Transportation Needs (01/27/2024)   Received from [facility] - Transportation    In the past 12 months, has lack of transportation kept you from medical appointments or from getting medications?: No    Lack of Transportation (Non-Medical): No  Physical Activity: Insufficiently Active (11/25/2023)   Exercise Vital Sign    Days of Exercise per Week: 7 days    Minutes of Exercise per Session: 20 min  Stress: No Stress Concern Present (11/25/2023)   [employer] of Occupational Health - Occupational Stress Questionnaire    Feeling of Stress : Not at all  Social Connections: Moderately Integrated (11/25/2023)   Social Connection and Isolation Panel    Frequency of Communication with Friends and Family: More than three times a week    Frequency of Social Gatherings with Friends and Family: More than three times a week    Attends Religious Services: More than 4 times per year    Active Member of [employer] or Organizations: No    Attends Banker Meetings: Never    Marital Status: Married  Catering manager Violence: Not At Risk (11/25/2023)   Humiliation, Afraid, Rape, and Kick questionnaire    Fear of Current or Ex-Partner: No    Emotionally Abused: No    Physically Abused: No    Sexually Abused: No    Past Surgical History:  Procedure Laterality Date   BACK SURGERY  2000   HERNIA REPAIR     ORIF FEMUR FRACTURE Right 1967   rod insertion   TONSILLECTOMY AND ADENOIDECTOMY     TOTAL HIP ARTHROPLASTY Right 01/28/2022   Procedure: RIGHT TOTAL HIP ARTHROPLASTY POSTERIOR, REMOVAL OF K NAIL;  Surgeon: Liam Lerner, MD;  Location: [facility];  Service: Orthopedics;  Laterality: Right;    Family History  Problem Relation Age of Onset   Heart failure Mother    Lupus Mother    Heart failure Father     No Known Allergies  Current Outpatient Medications on File Prior to Visit  Medication  Sig Dispense Refill   aspirin  EC 81 MG tablet Take 1 tablet (81 mg total) by mouth 2 (two) times daily. 60 tablet 0   diclofenac  Sodium (VOLTAREN ) 1 % GEL Apply 2 g topically 3 (three) times daily as needed. For pain. 100 g 0   ferrous sulfate  325 (65 FE) MG tablet Take 650 mg by mouth daily with breakfast.     fluticasone  (FLONASE ) 50 MCG/ACT nasal spray Place 1 spray into both nostrils daily as needed for allergies or rhinitis.     furosemide  (LASIX )  20 MG tablet TAKE 1 TABLET (20 MG TOTAL) BY MOUTH DAILY. FOR LEG SWELLING 90 tablet 2   gabapentin  (NEURONTIN ) 300 MG capsule TAKE 1 CAPSULE IN THE MORNING, 1 capsule in the afternoon, AND 3 CAPS AT BEDTIME FOR PAIN 450 capsule 3   lisinopril  (ZESTRIL ) 20 MG tablet Take 20 mg by mouth daily.     sildenafil  (VIAGRA ) 100 MG tablet Take 1 tablet by mouth 30 minutes prior to sexual activity. 30 tablet 0   tamsulosin  (FLOMAX ) 0.4 MG CAPS capsule TAKE 1 CAPSULE BY MOUTH IN THE MORNING FOR  URINE  STREAM 90 capsule 2   No current facility-administered medications on file prior to visit.    BP 110/64   Pulse (!) 105   Temp (!) 97.2 F (36.2 C) (Temporal)   Ht 5' 11 (1.803 m)   Wt 257 lb (116.6 kg)   SpO2 96%   BMI 35.84 kg/m  Objective:   Physical Exam Cardiovascular:     Rate and Rhythm: Normal rate and regular rhythm.  Pulmonary:     Effort: Pulmonary effort is normal.     Breath sounds: Normal breath sounds.  Musculoskeletal:     Cervical back: Neck supple.  Skin:    General: Skin is warm and dry.  Neurological:     Mental Status: He is alert and oriented to person, place, and time.  Psychiatric:        Mood and Affect: Mood normal.     Physical Exam        Assessment & Plan:  Primary hypertension Assessment & Plan: With recent ED visit. ED notes and labs reviewed.  Well controlled but with what sounds like orhtostatic hypotension.  I do believe that furosemide  40 mg daily and lisinopril  20 mg daily is too much. Reduce  furosemide  20 mg daily and lisinopril  20 mg daily.  Checking labs BNP and BMP today.  He will update in 2 weeks with BP readings.   Orders: -     Basic metabolic panel with GFR  Congestive heart failure, unspecified HF chronicity, unspecified heart failure type [facility]) Assessment & Plan: Appears euvolemic.   Reduce furosemide  to 20 mg daily. BMP and BNP pending.    Orders: -     Brain natriuretic peptide    Assessment and Plan Assessment & Plan         Comer MARLA Gaskins, NP     History of Present Illness

## 2024-03-26 NOTE — Assessment & Plan Note (Addendum)
 With recent ED visit. ED notes and labs reviewed.  Well controlled but with what sounds like orhtostatic hypotension.  I do believe that furosemide  40 mg daily and lisinopril  20 mg daily is too much. Reduce furosemide  20 mg daily and lisinopril  20 mg daily.  Checking labs BNP and BMP today.  He will update in 2 weeks with BP readings.

## 2024-03-26 NOTE — Assessment & Plan Note (Signed)
 Appears euvolemic.   Reduce furosemide  to 20 mg daily. BMP and BNP pending.

## 2024-03-27 ENCOUNTER — Ambulatory Visit: Payer: Self-pay | Admitting: Primary Care

## 2024-03-27 LAB — BRAIN NATRIURETIC PEPTIDE: Brain Natriuretic Peptide: 29 pg/mL (ref <100)

## 2024-03-29 NOTE — Telephone Encounter (Signed)
 Copied from CRM [id number]. Topic: General - Other >> Mar 29, 2024 12:34 PM [name] wrote: Reason for CRM: Pt states he received phone call and assumes it was from Dr Thomas nurse about some blood work since he just had an appt on Friday. He would a return phone call as he was on an important call and couldn't pick up at that time. Thank you

## 2024-04-01 ENCOUNTER — Ambulatory Visit: Admitting: Primary Care

## 2024-04-09 DIAGNOSIS — Z8249 Family history of ischemic heart disease and other diseases of the circulatory system: Secondary | ICD-10-CM | POA: Diagnosis not present

## 2024-04-09 DIAGNOSIS — E782 Mixed hyperlipidemia: Secondary | ICD-10-CM | POA: Diagnosis not present

## 2024-04-09 DIAGNOSIS — I10 Essential (primary) hypertension: Secondary | ICD-10-CM | POA: Diagnosis not present

## 2024-04-09 DIAGNOSIS — R0789 Other chest pain: Secondary | ICD-10-CM | POA: Diagnosis not present

## 2024-04-09 DIAGNOSIS — R0609 Other forms of dyspnea: Secondary | ICD-10-CM | POA: Diagnosis not present

## 2024-04-09 DIAGNOSIS — I209 Angina pectoris, unspecified: Secondary | ICD-10-CM | POA: Diagnosis not present

## 2024-04-12 ENCOUNTER — Other Ambulatory Visit: Payer: Self-pay | Admitting: Cardiology

## 2024-04-12 DIAGNOSIS — Z8249 Family history of ischemic heart disease and other diseases of the circulatory system: Secondary | ICD-10-CM

## 2024-04-12 DIAGNOSIS — E782 Mixed hyperlipidemia: Secondary | ICD-10-CM

## 2024-04-12 DIAGNOSIS — I10 Essential (primary) hypertension: Secondary | ICD-10-CM

## 2024-04-12 DIAGNOSIS — R0609 Other forms of dyspnea: Secondary | ICD-10-CM

## 2024-04-12 DIAGNOSIS — I209 Angina pectoris, unspecified: Secondary | ICD-10-CM

## 2024-04-12 DIAGNOSIS — R079 Chest pain, unspecified: Secondary | ICD-10-CM

## 2024-04-20 DIAGNOSIS — M5416 Radiculopathy, lumbar region: Secondary | ICD-10-CM | POA: Diagnosis not present

## 2024-04-21 DIAGNOSIS — R0789 Other chest pain: Secondary | ICD-10-CM | POA: Diagnosis not present

## 2024-04-30 ENCOUNTER — Telehealth (HOSPITAL_COMMUNITY): Payer: Self-pay | Admitting: Emergency Medicine

## 2024-04-30 ENCOUNTER — Encounter (HOSPITAL_COMMUNITY): Payer: Self-pay

## 2024-04-30 NOTE — Telephone Encounter (Signed)
 Reaching out to patient to offer assistance regarding upcoming cardiac imaging study; pt verbalizes understanding of appt date/time, parking situation and where to check in, pre-test NPO status and medications ordered, and verified current allergies; name and call back number provided for further questions should they arise Rockwell Alexandria RN Navigator Cardiac Imaging [facility] [phone] office [phone] cell

## 2024-05-03 ENCOUNTER — Ambulatory Visit
Admission: RE | Admit: 2024-05-03 | Discharge: 2024-05-03 | Disposition: A | Discharge status: Home / Self Care | Source: Ambulatory Visit | Attending: Cardiology | Admitting: Cardiology

## 2024-05-03 ENCOUNTER — Other Ambulatory Visit: Payer: Self-pay | Admitting: Cardiology

## 2024-05-03 DIAGNOSIS — I25118 Atherosclerotic heart disease of native coronary artery with other forms of angina pectoris: Secondary | ICD-10-CM

## 2024-05-03 DIAGNOSIS — E782 Mixed hyperlipidemia: Secondary | ICD-10-CM | POA: Insufficient documentation

## 2024-05-03 DIAGNOSIS — I209 Angina pectoris, unspecified: Secondary | ICD-10-CM | POA: Insufficient documentation

## 2024-05-03 DIAGNOSIS — I10 Essential (primary) hypertension: Secondary | ICD-10-CM | POA: Diagnosis not present

## 2024-05-03 DIAGNOSIS — R079 Chest pain, unspecified: Secondary | ICD-10-CM | POA: Insufficient documentation

## 2024-05-03 DIAGNOSIS — R918 Other nonspecific abnormal finding of lung field: Secondary | ICD-10-CM | POA: Diagnosis not present

## 2024-05-03 DIAGNOSIS — K449 Diaphragmatic hernia without obstruction or gangrene: Secondary | ICD-10-CM | POA: Insufficient documentation

## 2024-05-03 DIAGNOSIS — I77810 Thoracic aortic ectasia: Secondary | ICD-10-CM | POA: Insufficient documentation

## 2024-05-03 DIAGNOSIS — Z8249 Family history of ischemic heart disease and other diseases of the circulatory system: Secondary | ICD-10-CM | POA: Diagnosis not present

## 2024-05-03 DIAGNOSIS — R0609 Other forms of dyspnea: Secondary | ICD-10-CM | POA: Insufficient documentation

## 2024-05-03 MED ORDER — NITROGLYCERIN 0.4 MG SL SUBL
0.8000 mg | SUBLINGUAL_TABLET | Freq: Once | SUBLINGUAL | Status: AC
  Administered 2024-05-03: 0.8 mg via SUBLINGUAL
  Filled 2024-05-03: qty 25

## 2024-05-03 MED ORDER — IOHEXOL 350 MG/ML SOLN
100.0000 mL | Freq: Once | INTRAVENOUS | Status: AC | PRN
  Administered 2024-05-03: 100 mL via INTRAVENOUS

## 2024-05-03 NOTE — Progress Notes (Signed)
Patient tolerated CT well.  Vital signs stable encourage to drink water throughout day.Reasons explained and verbalized understanding.   

## 2024-05-04 ENCOUNTER — Ambulatory Visit: Payer: Self-pay

## 2024-05-04 ENCOUNTER — Other Ambulatory Visit: Payer: Self-pay | Admitting: Primary Care

## 2024-05-04 DIAGNOSIS — N401 Enlarged prostate with lower urinary tract symptoms: Secondary | ICD-10-CM

## 2024-05-04 MED ORDER — TAMSULOSIN HCL 0.4 MG PO CAPS: ORAL_CAPSULE | ORAL | 1 refills | Status: AC

## 2024-05-04 NOTE — Telephone Encounter (Unsigned)
 Copied from CRM [id number]. Topic: Clinical - Medication Refill >> May 04, 2024  8:00 AM Donna BRAVO wrote: Medication: tamsulosin  (FLOMAX ) 0.4 MG CAPS capsule  Has the patient contacted their pharmacy? No No refills on bottle and changing pharmacies   This is the patient's preferred pharmacy:   [facility] [phone] [place] - [address] [place] [address] Phone: [phone] Fax: [phone]    Is this the correct pharmacy for this prescription? Yes If no, delete pharmacy and type the correct one.   Has the prescription been filled recently? No  Is the patient out of the medication? Yes  Has the patient been seen for an appointment in the last year OR does the patient have an upcoming appointment? Yes  Can we respond through MyChart? No  Agent: Please be advised that Rx refills may take up to 3 business days. We ask that you follow-up with your pharmacy.

## 2024-05-04 NOTE — Telephone Encounter (Signed)
  FYI Only or Action Required?: Action required by provider: medication refill request.  Patient was last seen in primary care on 03/26/2024 by Gretta Comer POUR, NP.  Called Nurse Triage reporting Urinary Retention.  Symptoms began several days ago.  Interventions attempted: Rest, hydration, or home remedies.  Symptoms are: gradually worsening.  Triage Disposition: See PCP Within 2 Weeks  Patient/caregiver understands and will follow disposition?: No, wishes to speak with PCP   Copied from CRM #[id number]. Topic: Clinical - Red Word Triage >> May 04, 2024  9:02 AM Mesmerise C wrote: [facility] that prompted transfer to Nurse Triage: Patient has been without his tamsulosin  (FLOMAX ) 0.4 MG CAPS capsule for a week now and has been struggling to urinate Reason for Disposition  Urination is difficult to start (i.e., hesitancy) or straining  Answer Assessment - Initial Assessment Questions Additional info: 1) Patient is out of Flomax  X 1 week, asking for refill asap.   2) Declines need for office visit, ran out of med.     1. SYMPTOM: What's the main symptom you're concerned about? (e.g., frequency, incontinence)     Difficulty starting urine stream 2. ONSET: When did the hesitancy start?     Few days ago 3. PAIN: Is there any pain? If Yes, ask: How bad is it? (Scale: 1-10; mild, moderate, severe)     denies 4. CAUSE: What do you think is causing the symptoms?     bph 5. OTHER SYMPTOMS: Do you have any other symptoms? (e.g., blood in urine, fever, flank pain, pain with urination)     denies 6. PREGNANCY: Is there any chance you are pregnant? When was your last menstrual period?  Protocols used: Urinary Symptoms-A-AH

## 2024-05-06 DIAGNOSIS — I10 Essential (primary) hypertension: Secondary | ICD-10-CM | POA: Diagnosis not present

## 2024-05-06 DIAGNOSIS — E782 Mixed hyperlipidemia: Secondary | ICD-10-CM | POA: Diagnosis not present

## 2024-05-06 DIAGNOSIS — I2510 Atherosclerotic heart disease of native coronary artery without angina pectoris: Secondary | ICD-10-CM | POA: Diagnosis not present

## 2024-05-06 DIAGNOSIS — Z8249 Family history of ischemic heart disease and other diseases of the circulatory system: Secondary | ICD-10-CM | POA: Diagnosis not present

## 2024-05-06 DIAGNOSIS — R0609 Other forms of dyspnea: Secondary | ICD-10-CM | POA: Diagnosis not present

## 2024-05-06 DIAGNOSIS — I2584 Coronary atherosclerosis due to calcified coronary lesion: Secondary | ICD-10-CM | POA: Diagnosis not present

## 2024-05-06 DIAGNOSIS — R0789 Other chest pain: Secondary | ICD-10-CM | POA: Diagnosis not present

## 2024-05-19 ENCOUNTER — Observation Stay
Admission: RE | Admit: 2024-05-19 | Discharge: 2024-05-20 | Disposition: A | Discharge status: Home / Self Care | Attending: Internal Medicine | Admitting: Internal Medicine

## 2024-05-19 ENCOUNTER — Other Ambulatory Visit: Payer: Self-pay

## 2024-05-19 ENCOUNTER — Encounter: Admission: RE | Disposition: A | Payer: Self-pay | Source: Home / Self Care

## 2024-05-19 DIAGNOSIS — I10 Essential (primary) hypertension: Secondary | ICD-10-CM | POA: Insufficient documentation

## 2024-05-19 DIAGNOSIS — E782 Mixed hyperlipidemia: Secondary | ICD-10-CM | POA: Diagnosis not present

## 2024-05-19 DIAGNOSIS — R931 Abnormal findings on diagnostic imaging of heart and coronary circulation: Principal | ICD-10-CM

## 2024-05-19 DIAGNOSIS — Z8249 Family history of ischemic heart disease and other diseases of the circulatory system: Secondary | ICD-10-CM | POA: Insufficient documentation

## 2024-05-19 DIAGNOSIS — Z955 Presence of coronary angioplasty implant and graft: Secondary | ICD-10-CM

## 2024-05-19 DIAGNOSIS — R079 Chest pain, unspecified: Secondary | ICD-10-CM | POA: Diagnosis not present

## 2024-05-19 DIAGNOSIS — Z79899 Other long term (current) drug therapy: Secondary | ICD-10-CM | POA: Insufficient documentation

## 2024-05-19 DIAGNOSIS — Z9861 Coronary angioplasty status: Secondary | ICD-10-CM | POA: Insufficient documentation

## 2024-05-19 DIAGNOSIS — I25110 Atherosclerotic heart disease of native coronary artery with unstable angina pectoris: Principal | ICD-10-CM | POA: Insufficient documentation

## 2024-05-19 DIAGNOSIS — Z7982 Long term (current) use of aspirin: Secondary | ICD-10-CM | POA: Insufficient documentation

## 2024-05-19 HISTORY — PX: CORONARY STENT INTERVENTION: CATH118234

## 2024-05-19 HISTORY — PX: LEFT HEART CATH AND CORONARY ANGIOGRAPHY: CATH118249

## 2024-05-19 LAB — POCT ACTIVATED CLOTTING TIME
Activated Clotting Time: 302 s
Activated Clotting Time: 314 s

## 2024-05-19 SURGERY — LEFT HEART CATH AND CORONARY ANGIOGRAPHY
Anesthesia: Moderate Sedation

## 2024-05-19 MED ORDER — FREE WATER
500.0000 mL | Freq: Once | Status: AC
  Administered 2024-05-19: 500 mL via ORAL

## 2024-05-19 MED ORDER — SODIUM CHLORIDE 0.9% FLUSH: 3.0000 mL | INTRAVENOUS | Status: DC | PRN

## 2024-05-19 MED ORDER — VERAPAMIL HCL 2.5 MG/ML IV SOLN
INTRAVENOUS | Status: DC | PRN
  Administered 2024-05-19: 2.5 mg via INTRA_ARTERIAL

## 2024-05-19 MED ORDER — HYDRALAZINE HCL 20 MG/ML IJ SOLN: 10.0000 mg | INTRAMUSCULAR | Status: AC | PRN

## 2024-05-19 MED ORDER — MIDAZOLAM HCL 2 MG/2ML IJ SOLN
INTRAMUSCULAR | Status: AC
  Filled 2024-05-19: qty 2

## 2024-05-19 MED ORDER — NITROGLYCERIN 1 MG/10 ML FOR IR/CATH LAB
INTRA_ARTERIAL | Status: AC
  Filled 2024-05-19: qty 10

## 2024-05-19 MED ORDER — ACETAMINOPHEN 325 MG PO TABS
650.0000 mg | ORAL_TABLET | Freq: Four times a day (QID) | ORAL | Status: DC | PRN
  Administered 2024-05-19 – 2024-05-20 (×2): 650 mg via ORAL
  Filled 2024-05-19 (×2): qty 2

## 2024-05-19 MED ORDER — HEPARIN SODIUM (PORCINE) 1000 UNIT/ML IJ SOLN
INTRAMUSCULAR | Status: AC
  Filled 2024-05-19: qty 10

## 2024-05-19 MED ORDER — ASPIRIN 81 MG PO CHEW
81.0000 mg | CHEWABLE_TABLET | ORAL | Status: AC
  Administered 2024-05-19: 81 mg via ORAL

## 2024-05-19 MED ORDER — LIDOCAINE HCL (PF) 1 % IJ SOLN
INTRAMUSCULAR | Status: DC | PRN
  Administered 2024-05-19: 2 mL

## 2024-05-19 MED ORDER — SODIUM CHLORIDE 0.9 % IV SOLN
250.0000 mL | INTRAVENOUS | Status: DC | PRN
Start: 2024-05-19 — End: 2024-05-20

## 2024-05-19 MED ORDER — CLOPIDOGREL BISULFATE 75 MG PO TABS
ORAL_TABLET | ORAL | Status: DC | PRN
  Administered 2024-05-19: 600 mg via ORAL

## 2024-05-19 MED ORDER — HEPARIN (PORCINE) IN NACL 1000-0.9 UT/500ML-% IV SOLN
INTRAVENOUS | Status: AC
  Filled 2024-05-19: qty 1000

## 2024-05-19 MED ORDER — CLOPIDOGREL BISULFATE 75 MG PO TABS
75.0000 mg | ORAL_TABLET | Freq: Every day | ORAL | Status: DC
  Administered 2024-05-20: 75 mg via ORAL
  Filled 2024-05-19: qty 1

## 2024-05-19 MED ORDER — HEPARIN SODIUM (PORCINE) 1000 UNIT/ML IJ SOLN
INTRAMUSCULAR | Status: DC | PRN
  Administered 2024-05-19: 5000 [IU] via INTRAVENOUS
  Administered 2024-05-19: 3000 [IU] via INTRAVENOUS
  Administered 2024-05-19: 7000 [IU] via INTRAVENOUS

## 2024-05-19 MED ORDER — GABAPENTIN 300 MG PO CAPS
300.0000 mg | ORAL_CAPSULE | Freq: Three times a day (TID) | ORAL | Status: DC
  Administered 2024-05-19 – 2024-05-20 (×2): 300 mg via ORAL
  Filled 2024-05-19 (×2): qty 1

## 2024-05-19 MED ORDER — NITROGLYCERIN 1 MG/10 ML FOR IR/CATH LAB
INTRA_ARTERIAL | Status: DC | PRN
  Administered 2024-05-19: 200 ug via INTRACORONARY

## 2024-05-19 MED ORDER — ONDANSETRON HCL 4 MG/2ML IJ SOLN: 4.0000 mg | Freq: Four times a day (QID) | INTRAMUSCULAR | Status: DC | PRN

## 2024-05-19 MED ORDER — HEPARIN (PORCINE) IN NACL 1000-0.9 UT/500ML-% IV SOLN
INTRAVENOUS | Status: DC | PRN
  Administered 2024-05-19: 1000 mL

## 2024-05-19 MED ORDER — SODIUM CHLORIDE 0.9 % IV SOLN
250.0000 mL | INTRAVENOUS | Status: DC | PRN
  Administered 2024-05-19: 250 mL via INTRAVENOUS

## 2024-05-19 MED ORDER — VERAPAMIL HCL 2.5 MG/ML IV SOLN
INTRAVENOUS | Status: AC
  Filled 2024-05-19: qty 2

## 2024-05-19 MED ORDER — SODIUM CHLORIDE 0.9% FLUSH: 3.0000 mL | Freq: Two times a day (BID) | INTRAVENOUS | Status: DC

## 2024-05-19 MED ORDER — FENTANYL CITRATE (PF) 100 MCG/2ML IJ SOLN
INTRAMUSCULAR | Status: DC | PRN
  Administered 2024-05-19: 50 ug via INTRAVENOUS

## 2024-05-19 MED ORDER — ASPIRIN 81 MG PO CHEW
CHEWABLE_TABLET | ORAL | Status: AC
  Filled 2024-05-19: qty 1

## 2024-05-19 MED ORDER — MIDAZOLAM HCL (PF) 2 MG/2ML IJ SOLN
INTRAMUSCULAR | Status: DC | PRN
  Administered 2024-05-19: 1 mg via INTRAVENOUS

## 2024-05-19 MED ORDER — SODIUM CHLORIDE 0.9% FLUSH
3.0000 mL | Freq: Two times a day (BID) | INTRAVENOUS | Status: DC
  Administered 2024-05-19 – 2024-05-20 (×2): 3 mL via INTRAVENOUS

## 2024-05-19 MED ORDER — CLOPIDOGREL BISULFATE 75 MG PO TABS
ORAL_TABLET | ORAL | Status: AC
  Filled 2024-05-19: qty 8

## 2024-05-19 MED ORDER — LIDOCAINE HCL 1 % IJ SOLN
INTRAMUSCULAR | Status: AC
  Filled 2024-05-19: qty 20

## 2024-05-19 MED ORDER — IOHEXOL 300 MG/ML  SOLN
INTRAMUSCULAR | Status: DC | PRN
  Administered 2024-05-19: 158 mL

## 2024-05-19 MED ORDER — FENTANYL CITRATE (PF) 100 MCG/2ML IJ SOLN
INTRAMUSCULAR | Status: AC
  Filled 2024-05-19: qty 2

## 2024-05-19 SURGICAL SUPPLY — 18 items
BALLOON ~~LOC~~ TREK NEO RX 2.5X15 (BALLOONS) IMPLANT
BALLOON ~~LOC~~ TREK NEO RX 3.5X12 (BALLOONS) IMPLANT
CATH EAGLE EYE PLAT IMAGING (CATHETERS) IMPLANT
CATH INFINITI 5 FR JL3.5 (CATHETERS) IMPLANT
CATH INFINITI JR4 5F (CATHETERS) IMPLANT
CATH VISTA GUIDE 6FR JL3.5 MPK (CATHETERS) IMPLANT
DEVICE RAD TR BAND REGULAR (VASCULAR PRODUCTS) IMPLANT
DRAPE BRACHIAL (DRAPES) IMPLANT
GLIDESHEATH SLEND A-KIT 6F 22G (SHEATH) IMPLANT
GUIDEWIRE INQWIRE 1.5J.035X260 (WIRE) IMPLANT
KIT ENCORE 26 ADVANTAGE (KITS) IMPLANT
PACK CARDIAC CATH (CUSTOM PROCEDURE TRAY) ×3 IMPLANT
SET ATX-X65L (MISCELLANEOUS) IMPLANT
STATION PROTECTION PRESSURIZED (MISCELLANEOUS) IMPLANT
STENT ONYX FRONTIER 3.0X18 (Permanent Stent) IMPLANT
TUBING CIL FLEX 10 FLL-RA (TUBING) IMPLANT
VALVE COPILOT STAT (MISCELLANEOUS) IMPLANT
WIRE G HI TQ BMW 190 (WIRE) IMPLANT

## 2024-05-19 NOTE — Progress Notes (Addendum)
 Brief cardiology note not for billing purposes Patient presented for outpatient LHC today with Dr. Katina. Found to have mid LAD lesion with 95% stenosis and underwent DES placement with 3.0 x 18 mm Onyx Frontier stent. Stable post-procedure. Will be admitted under observation status overnight with plan for discharge in the AM barring any complications through recovery process.   SignedBETHA Danita Bloch, PA-C  05/19/24 2:11 PM

## 2024-05-20 DIAGNOSIS — I25110 Atherosclerotic heart disease of native coronary artery with unstable angina pectoris: Secondary | ICD-10-CM | POA: Diagnosis not present

## 2024-05-20 LAB — BASIC METABOLIC PANEL WITH GFR
Anion gap: 7 (ref 5–15)
BUN: 18 mg/dL (ref 8–23)
CO2: 26 mmol/L (ref 22–32)
Calcium: 9.1 mg/dL (ref 8.9–10.3)
Chloride: 106 mmol/L (ref 98–111)
Creatinine, Ser: 0.87 mg/dL (ref 0.61–1.24)
GFR, Estimated: >60 mL/min (ref >60)
Glucose, Bld: 111 mg/dL — ABNORMAL HIGH (ref 70–99)
Potassium: 3.8 mmol/L (ref 3.5–5.1)
Sodium: 139 mmol/L (ref 135–145)

## 2024-05-20 LAB — CBC
HCT: 41.5 % (ref 39.0–52.0)
Hemoglobin: 14.2 g/dL (ref 13.0–17.0)
MCH: 32.4 pg (ref 26.0–34.0)
MCHC: 34.2 g/dL (ref 30.0–36.0)
MCV: 94.7 fL (ref 80.0–100.0)
Platelets: 218 K/uL (ref 150–400)
RBC: 4.38 MIL/uL (ref 4.22–5.81)
RDW: 12.2 % (ref 11.5–15.5)
WBC: 8.6 K/uL (ref 4.0–10.5)
nRBC: 0 % (ref 0.0–0.2)

## 2024-05-20 MED ORDER — ATORVASTATIN CALCIUM 80 MG PO TABS: 80.0000 mg | ORAL_TABLET | Freq: Every day | ORAL | 0 refills | Status: AC

## 2024-05-20 MED ORDER — ASPIRIN 81 MG PO TBEC
81.0000 mg | DELAYED_RELEASE_TABLET | Freq: Two times a day (BID) | ORAL | Status: DC
  Administered 2024-05-20: 81 mg via ORAL
  Filled 2024-05-20: qty 1

## 2024-05-20 MED ORDER — FERROUS SULFATE 325 (65 FE) MG PO TABS
325.0000 mg | ORAL_TABLET | Freq: Two times a day (BID) | ORAL | Status: DC
  Administered 2024-05-20: 325 mg via ORAL
  Filled 2024-05-20: qty 1

## 2024-05-20 MED ORDER — CLOPIDOGREL BISULFATE 75 MG PO TABS: 75.0000 mg | ORAL_TABLET | Freq: Every day | ORAL | 0 refills | Status: AC

## 2024-05-20 MED ORDER — FUROSEMIDE 20 MG PO TABS
20.0000 mg | ORAL_TABLET | Freq: Every day | ORAL | Status: DC
  Administered 2024-05-20: 20 mg via ORAL
  Filled 2024-05-20: qty 1

## 2024-05-20 MED ORDER — ATORVASTATIN CALCIUM 20 MG PO TABS
40.0000 mg | ORAL_TABLET | Freq: Every day | ORAL | Status: DC
  Administered 2024-05-20: 40 mg via ORAL
  Filled 2024-05-20: qty 2

## 2024-05-20 MED ORDER — AMLODIPINE BESYLATE 5 MG PO TABS
5.0000 mg | ORAL_TABLET | Freq: Every day | ORAL | Status: DC
  Administered 2024-05-20: 5 mg via ORAL
  Filled 2024-05-20: qty 1

## 2024-05-20 MED ORDER — TAMSULOSIN HCL 0.4 MG PO CAPS
0.4000 mg | ORAL_CAPSULE | Freq: Every day | ORAL | Status: DC
  Administered 2024-05-20: 0.4 mg via ORAL
  Filled 2024-05-20: qty 1

## 2024-05-20 NOTE — TOC CM/SW Note (Signed)
 Transition of Care [facility]) - Inpatient Brief Assessment   Patient Details  Name: Timothy Sheppard MRN: [id number] Date of Birth: October 25, 1945  Transition of Care [facility]) CM/SW Contact:    Lauraine JAYSON Carpen, LCSW Phone Number: 05/20/2024, 8:53 AM   Clinical Narrative: Patient has orders to discharge home today. Chart reviewed. No TOC needs identified. CSW signing off.  Transition of Care Asessment: Insurance and Status: Insurance coverage has been reviewed Patient has primary care physician: Yes Home environment has been reviewed: Single family home Prior level of function:: Not documented Prior/Current Home Services: No current home services Social Drivers of Health Review: SDOH reviewed no interventions necessary Readmission risk has been reviewed: Yes Transition of care needs: no transition of care needs at this time

## 2024-05-20 NOTE — Discharge Summary (Signed)
 Discharge Summary      Patient ID: Timothy Sheppard MRN: [id number] DOB/AGE: 08/19/1945 78 y.o.  Admit date: 05/19/2024 Discharge date: 05/20/2024  Primary Discharge Diagnosis Coronary artery disease with unstable angina Secondary Discharge Diagnosis: S/p percutaneous coronary intervention  Significant Diagnostic Studies: Left heart catheterization with percutaneous coronary intervention   Consults: None  Hospital Course: The patient was brought to the cardiac cath lab and underwent left heart catheterization and coronary angiography with Dr. Katina on 05/19/24. The patient tolerated with procedure well without complications.  The patient received 3.0 x 18 mm Onyx Frontier stent to the mid LAD . On 05/20/24 the right wrist access site was examined and found to be without significant erythema, tenderness to palpation, or apparent aneurysm. Hospital course was overall uneventful, on day of discharge the patient was ambulatory and eager to go home.  Discussed cardiac rehab and new prescription medications in detail. The patient was given aftercare instructions and ER return precautions. Will arrange for follow up in office in 1 week, or sooner if needed. Appointment date/time listed below and documented in Provider Navigator tab.  Discharge Exam: Blood pressure (!) 149/78, pulse (!) 59, temperature 98 F (36.7 C), resp. rate 18, height 5' 11 (1.803 m), weight 117 kg, SpO2 97%.   General: Well appearing male, well nourished, in no acute distress.  HEENT: Normocephalic and atraumatic. Neck: No JVD.  Lungs: Normal respiratory effort on room air.  Clear to ascultation bilaterally. Heart: HRRR . Normal S1 and S2 without gallops or murmurs.  Abdomen: Non-distended appearing.  Msk: Normal strength and tone for age. Extremities: No peripheral edema. R wrist access site without significant tenderness to palpation, apparent aneurysm. Bruising noted, no evidence of active bleeding. Covered with  clean gauze and tegaderm dressing.  Neuro: Alert and oriented x3 Psych: Calm and cooperative.   Labs: Lab Results  Component Value Date   WBC 8.6 05/20/2024   HGB 14.2 05/20/2024   HCT 41.5 05/20/2024   MCV 94.7 05/20/2024   PLT 218 05/20/2024    Recent Labs  Lab 05/20/24 0454  NA 139  K 3.8  CL 106  CO2 26  BUN 18  CREATININE 0.87  CALCIUM 9.1  GLUCOSE 111*      Radiology: None EKG: NSR rate 78 bpm  FOLLOW UP PLANS AND APPOINTMENTS Discharge Instructions     AMB Referral to Cardiac Rehabilitation - Phase II   Complete by: As directed    Diagnosis: Coronary Stents   After initial evaluation and assessments completed: Virtual Based Care may be provided alone or in conjunction with Phase 2 Cardiac Rehab based on patient barriers.: Yes   Intensive Cardiac Rehabilitation (ICR) [facility] location only OR Traditional Cardiac Rehabilitation (TCR) *If criteria for ICR are not met will enroll in TCR ([facility] only): Yes      Allergies as of 05/20/2024   No Known Allergies      Medication List     STOP taking these medications    sildenafil  100 MG tablet Commonly known as: Viagra        TAKE these medications    acetaminophen  500 MG tablet Commonly known as: TYLENOL  Take 1,000 mg by mouth every 6 (six) hours as needed for mild pain (pain score 1-3), moderate pain (pain score 4-6) or headache.   amLODipine 5 MG tablet Commonly known as: NORVASC Take 5 mg by mouth daily.   aspirin  EC 81 MG tablet Take 1 tablet (81 mg total) by mouth 2 (two) times  daily.   atorvastatin 80 MG tablet Commonly known as: LIPITOR Take 1 tablet (80 mg total) by mouth daily. What changed:  medication strength how much to take   clopidogrel 75 MG tablet Commonly known as: PLAVIX Take 1 tablet (75 mg total) by mouth daily with breakfast.   ferrous sulfate  325 (65 FE) MG tablet Take 325 mg by mouth 2 (two) times daily with a meal.   fluticasone  50 MCG/ACT nasal spray Commonly known  as: FLONASE  Place 1 spray into both nostrils daily as needed for allergies or rhinitis.   furosemide  20 MG tablet Commonly known as: LASIX  TAKE 1 TABLET (20 MG TOTAL) BY MOUTH DAILY. FOR LEG SWELLING   gabapentin  300 MG capsule Commonly known as: NEURONTIN  TAKE 1 CAPSULE IN THE MORNING, 1 capsule in the afternoon, AND 3 CAPS AT BEDTIME FOR PAIN   [employer] Pain 500-325 MG Pack Generic drug: Aspirin -Acetaminophen  Take 1 Package by mouth daily as needed (Pain).   tamsulosin  0.4 MG Caps capsule Commonly known as: FLOMAX  TAKE 1 CAPSULE BY MOUTH IN THE MORNING FOR  URINE  STREAM       Follow up appointment: Appointment scheduled with Dr. Wilburn for 05/28/24 at 9:30 AM   POST-PCI CHECKLIST Aspirin  prescribed? Yes ADP Receptor Inhibitor prescribed? Yes - Plavix Statin prescribed? Yes - Atorvastatin 80 mg Beta-blocker prescribed? No - baseline bradycardia in 50s Cardiac rehab ordered? Yes   PLEASE BRING ALL MEDICATIONS WITH YOU TO FOLLOW UP APPOINTMENTS  Time spent with patient: 35  This patient's plan of care was discussed and created with Dr. Custovic and she is in agreement.    Signed: Danita Bloch, PA-C 05/20/2024, 8:12 AM [facility]

## 2024-05-21 LAB — LIPOPROTEIN A (LPA): Lipoprotein (a): 90.8 nmol/L — ABNORMAL HIGH (ref <75.0)

## 2024-06-08 ENCOUNTER — Ambulatory Visit: Admitting: Family Medicine

## 2024-06-30 ENCOUNTER — Ambulatory Visit
Admission: RE | Admit: 2024-06-30 | Discharge: 2024-06-30 | Disposition: A | Discharge status: Home / Self Care | Source: Ambulatory Visit | Attending: Nurse Practitioner | Admitting: Nurse Practitioner

## 2024-06-30 ENCOUNTER — Other Ambulatory Visit: Payer: Self-pay | Admitting: Nurse Practitioner

## 2024-06-30 DIAGNOSIS — M7989 Other specified soft tissue disorders: Secondary | ICD-10-CM | POA: Insufficient documentation

## 2024-11-25 ENCOUNTER — Ambulatory Visit

## 2024-11-26 ENCOUNTER — Ambulatory Visit
